# Patient Record
Sex: Male | Born: 1990 | Race: White | Hispanic: No | Marital: Married | Smoking: Never smoker
Health system: Southern US, Community
[De-identification: ages and names within clinical notes are randomized; demographics above are authoritative.]

## PROBLEM LIST (undated history)

## (undated) DIAGNOSIS — F329 Major depressive disorder, single episode, unspecified: Secondary | ICD-10-CM

## (undated) DIAGNOSIS — F319 Bipolar disorder, unspecified: Secondary | ICD-10-CM

## (undated) DIAGNOSIS — F32A Depression, unspecified: Secondary | ICD-10-CM

## (undated) HISTORY — DX: Depression, unspecified: F32.A

## (undated) HISTORY — DX: Major depressive disorder, single episode, unspecified: F32.9

## (undated) HISTORY — DX: Bipolar disorder, unspecified: F31.9

## (undated) MED FILL — Fosaprepitant Dimeglumine For IV Infusion 150 MG (Base Eq): INTRAVENOUS | Qty: 5 | Status: AC

---
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
## 2015-01-08 ENCOUNTER — Ambulatory Visit (INDEPENDENT_AMBULATORY_CARE_PROVIDER_SITE_OTHER): Payer: 59 | Admitting: Family Medicine

## 2015-01-08 ENCOUNTER — Encounter: Payer: Self-pay | Admitting: Family Medicine

## 2015-01-08 VITALS — BP 97/64 | HR 65 | Temp 98.8°F | Ht 68.7 in | Wt 158.0 lb

## 2015-01-08 DIAGNOSIS — L80 Vitiligo: Secondary | ICD-10-CM | POA: Diagnosis not present

## 2015-01-08 DIAGNOSIS — F32A Depression, unspecified: Secondary | ICD-10-CM

## 2015-01-08 DIAGNOSIS — F329 Major depressive disorder, single episode, unspecified: Secondary | ICD-10-CM | POA: Diagnosis not present

## 2015-01-08 MED ORDER — QUETIAPINE FUMARATE 50 MG PO TABS: 50.0000 mg | ORAL_TABLET | Freq: Every day | ORAL | Status: DC

## 2015-01-08 NOTE — Assessment & Plan Note (Signed)
Restart Seroquel 

## 2015-01-08 NOTE — Progress Notes (Signed)
   BP 97/64 mmHg  Pulse 65  Temp(Src) 98.8 F (37.1 C)  Ht 5' 8.7" (1.745 m)  Wt 158 lb (71.668 kg)  BMI 23.54 kg/m2  SpO2 98%   Subjective:    Patient ID: Benjamin Choi, male    DOB: [birth year], 24 y.o.   MRN: [id number]  HPI: Benjamin OverlieDylan M Wish is a 24 y.o. male  Chief Complaint  Patient presents with  . bald spot    after haircut x322months, not growing back  . Depression    would like to get back on meds   Approximately 2 months ago patient noticed a bald spot after getting close haircut. This area has not really grown it's in the posterior right scalp area behind his right ear. No known irritation no itching no redness noted lesions no contacts. Patient also has insurance issues straightened out did better on Seroquel immediate release wants to get back on that medication.  Relevant past medical, surgical, family and social history reviewed and updated as indicated. Interim medical history since our last visit reviewed. Allergies and medications reviewed and updated.  Review of Systems  Constitutional: Negative.   Respiratory: Negative.   Cardiovascular: Negative.     Per HPI unless specifically indicated above     Objective:    BP 97/64 mmHg  Pulse 65  Temp(Src) 98.8 F (37.1 C)  Ht 5' 8.7" (1.745 m)  Wt 158 lb (71.668 kg)  BMI 23.54 kg/m2  SpO2 98%  Wt Readings from Last 3 Encounters:  01/08/15 158 lb (71.668 kg)  08/02/13 151 lb (68.493 kg)    Physical Exam  Constitutional: He is oriented to person, place, and time. He appears well-developed and well-nourished. No distress.  HENT:  Head: Normocephalic and atraumatic.  Right Ear: Hearing normal.  Left Ear: Hearing normal.  Nose: Nose normal.  Eyes: Conjunctivae and lids are normal. Right eye exhibits no discharge. Left eye exhibits no discharge. No scleral icterus.  Cardiovascular: Normal rate, regular rhythm and normal heart sounds.   Pulmonary/Chest: Effort normal. No respiratory distress.   Musculoskeletal: Normal range of motion.  Neurological: He is alert and oriented to person, place, and time.  Skin: Skin is intact. No rash noted.  Area of vitiligo and posterior right ear  Psychiatric: He has a normal mood and affect. His speech is normal and behavior is normal. Judgment and thought content normal. Cognition and memory are normal.    No results found for this or any previous visit.    Assessment & Plan:   Problem List Items Addressed This Visit      Musculoskeletal and Integument   Vitiligo capitis    We'll draw labs and refer to dermatology      Relevant Orders   Ambulatory referral to Dermatology     Other   Depression - Primary    Restart Seroquel      Relevant Medications   QUEtiapine (SEROQUEL) 50 MG tablet       Follow up plan: Return in about 6 months (around 07/08/2015) for Physical Exam.

## 2015-01-08 NOTE — Assessment & Plan Note (Signed)
We'll draw labs and refer to dermatology

## 2015-01-09 ENCOUNTER — Encounter: Payer: Self-pay | Admitting: Family Medicine

## 2015-01-09 LAB — COMPREHENSIVE METABOLIC PANEL
ALBUMIN: 4.4 g/dL (ref 3.5–5.5)
ALK PHOS: 74 IU/L (ref 39–117)
ALT: 19 IU/L (ref 0–44)
AST: 29 IU/L (ref 0–40)
Albumin/Globulin Ratio: 2.2 (ref 1.1–2.5)
BILIRUBIN TOTAL: 1 mg/dL (ref 0.0–1.2)
BUN / CREAT RATIO: 10 (ref 8–19)
BUN: 11 mg/dL (ref 6–20)
CHLORIDE: 100 mmol/L (ref 97–106)
CO2: 25 mmol/L (ref 18–29)
Calcium: 9.4 mg/dL (ref 8.7–10.2)
Creatinine, Ser: 1.15 mg/dL (ref 0.76–1.27)
GFR calc non Af Amer: 90 mL/min/{1.73_m2} (ref >59)
GFR, EST AFRICAN AMERICAN: 104 mL/min/{1.73_m2} (ref >59)
GLOBULIN, TOTAL: 2 g/dL (ref 1.5–4.5)
Glucose: 65 mg/dL (ref 65–99)
Potassium: 4.2 mmol/L (ref 3.5–5.2)
SODIUM: 141 mmol/L (ref 136–144)
Total Protein: 6.4 g/dL (ref 6.0–8.5)

## 2015-01-09 LAB — VITAMIN B12: Vitamin B-12: 495 pg/mL (ref 211–946)

## 2015-01-09 LAB — TSH: TSH: 0.772 u[IU]/mL (ref 0.450–4.500)

## 2015-02-05 ENCOUNTER — Other Ambulatory Visit: Payer: Self-pay | Admitting: Unknown Physician Specialty

## 2015-02-05 NOTE — Telephone Encounter (Signed)
Your patient.  Thanks 

## 2015-07-09 ENCOUNTER — Encounter: Payer: Self-pay | Admitting: Family Medicine

## 2015-07-09 ENCOUNTER — Ambulatory Visit (INDEPENDENT_AMBULATORY_CARE_PROVIDER_SITE_OTHER): Payer: BLUE CROSS/BLUE SHIELD | Admitting: Family Medicine

## 2015-07-09 VITALS — BP 96/56 | HR 62 | Temp 98.2°F | Ht 68.8 in | Wt 144.0 lb

## 2015-07-09 DIAGNOSIS — F32A Depression, unspecified: Secondary | ICD-10-CM

## 2015-07-09 DIAGNOSIS — Z0000 Encounter for general adult medical examination without abnormal findings: Secondary | ICD-10-CM | POA: Diagnosis not present

## 2015-07-09 DIAGNOSIS — F329 Major depressive disorder, single episode, unspecified: Secondary | ICD-10-CM

## 2015-07-09 LAB — URINALYSIS, ROUTINE W REFLEX MICROSCOPIC
Bilirubin, UA: NEGATIVE
Glucose, UA: NEGATIVE
Ketones, UA: NEGATIVE
LEUKOCYTES UA: NEGATIVE
NITRITE UA: NEGATIVE
PH UA: 6 (ref 5.0–7.5)
Protein, UA: NEGATIVE
RBC UA: NEGATIVE
Urobilinogen, Ur: 0.2 mg/dL (ref 0.2–1.0)

## 2015-07-09 NOTE — Addendum Note (Signed)
Addended by: Bennetta LaosWILSON, NANCY H on: 07/09/2015 10:50 AM   Modules accepted: SmartSet

## 2015-07-09 NOTE — Assessment & Plan Note (Signed)
The current medical regimen is effective;  continue present plan and medications.  

## 2015-07-09 NOTE — Progress Notes (Signed)
BP 96/56 mmHg  Pulse 62  Temp(Src) 98.2 F (36.8 C)  Ht 5' 8.8" (1.748 m)  Wt 144 lb (65.318 kg)  BMI 21.38 kg/m2  SpO2 96%   Subjective:    Patient ID: Benjamin Choi, male    DOB: 03-Jun-1990, 25 y.o.   MRN: [id number]  HPI: Benjamin Choi is a 25 y.o. male  Chief Complaint  Patient presents with  . Annual Exam   Patient's nerves doing well taking quetiapine at bedtime sleeps well wakes up okay no side effects and nerves are doing good. Patient's weights down sometimes it's been out of the gym Work is going well with no issues. Relevant past medical, surgical, family and social history reviewed and updated as indicated. Interim medical history since our last visit reviewed. Allergies and medications reviewed and updated.  Review of Systems  Constitutional: Negative.   HENT: Negative.   Eyes: Negative.   Respiratory: Negative.   Cardiovascular: Negative.   Gastrointestinal: Negative.   Endocrine: Negative.   Genitourinary: Negative.   Musculoskeletal: Negative.   Skin: Negative.   Allergic/Immunologic: Negative.   Neurological: Negative.   Hematological: Negative.   Psychiatric/Behavioral: Negative.     Per HPI unless specifically indicated above     Objective:    BP 96/56 mmHg  Pulse 62  Temp(Src) 98.2 F (36.8 C)  Ht 5' 8.8" (1.748 m)  Wt 144 lb (65.318 kg)  BMI 21.38 kg/m2  SpO2 96%  Wt Readings from Last 3 Encounters:  07/09/15 144 lb (65.318 kg)  01/08/15 158 lb (71.668 kg)  08/02/13 151 lb (68.493 kg)    Physical Exam  Constitutional: He is oriented to person, place, and time. He appears well-developed and well-nourished.  HENT:  Head: Normocephalic.  Right Ear: External ear normal.  Left Ear: External ear normal.  Nose: Nose normal.  Eyes: Conjunctivae and EOM are normal. Pupils are equal, round, and reactive to light.  Neck: Normal range of motion. Neck supple. No thyromegaly present.  Cardiovascular: Normal rate, regular rhythm, normal  heart sounds and intact distal pulses.   Pulmonary/Chest: Effort normal and breath sounds normal.  Abdominal: Soft. Bowel sounds are normal. There is no splenomegaly or hepatomegaly.  Genitourinary: Penis normal.  Musculoskeletal: Normal range of motion.  Lymphadenopathy:    He has no cervical adenopathy.  Neurological: He is alert and oriented to person, place, and time. He has normal reflexes.  Skin: Skin is warm and dry.  Psychiatric: He has a normal mood and affect. His behavior is normal. Judgment and thought content normal.    Results for orders placed or performed in visit on 01/08/15  Comprehensive metabolic panel  Result Value Ref Range   Glucose 65 65 - 99 mg/dL   BUN 11 6 - 20 mg/dL   Creatinine, Ser 4.091.15 0.76 - 1.27 mg/dL   GFR calc non Af Amer 90 >59 mL/min/1.73   GFR calc Af Amer 104 >59 mL/min/1.73   BUN/Creatinine Ratio 10 8 - 19   Sodium 141 136 - 144 mmol/L   Potassium 4.2 3.5 - 5.2 mmol/L   Chloride 100 97 - 106 mmol/L   CO2 25 18 - 29 mmol/L   Calcium 9.4 8.7 - 10.2 mg/dL   Total Protein 6.4 6.0 - 8.5 g/dL   Albumin 4.4 3.5 - 5.5 g/dL   Globulin, Total 2.0 1.5 - 4.5 g/dL   Albumin/Globulin Ratio 2.2 1.1 - 2.5   Bilirubin Total 1.0 0.0 - 1.2 mg/dL   Alkaline  Phosphatase 74 39 - 117 IU/L   AST 29 0 - 40 IU/L   ALT 19 0 - 44 IU/L  TSH  Result Value Ref Range   TSH 0.772 0.450 - 4.500 uIU/mL  Vitamin B12  Result Value Ref Range   Vitamin B-12 495 211 - 946 pg/mL      Assessment & Plan:   Problem List Items Addressed This Visit      Other   Depression    The current medical regimen is effective;  continue present plan and medications.        Other Visit Diagnoses    Routine general medical examination at a health care facility    -  Primary    Relevant Orders    CBC with Differential/Platelet    Comprehensive metabolic panel    Lipid Panel w/o Chol/HDL Ratio    TSH    Urinalysis, Routine w reflex microscopic (not at [facility])    Healthcare  maintenance            Follow up plan: Return in about 6 months (around 01/09/2016) for med check.

## 2015-07-10 ENCOUNTER — Encounter: Payer: Self-pay | Admitting: Family Medicine

## 2015-07-10 LAB — CBC WITH DIFFERENTIAL/PLATELET
Basophils Absolute: 0 10*3/uL (ref 0.0–0.2)
Basos: 1 %
EOS (ABSOLUTE): 0.1 10*3/uL (ref 0.0–0.4)
Eos: 3 %
HEMATOCRIT: 45.2 % (ref 37.5–51.0)
HEMOGLOBIN: 15.6 g/dL (ref 12.6–17.7)
Immature Grans (Abs): 0 10*3/uL (ref 0.0–0.1)
Immature Granulocytes: 0 %
LYMPHS ABS: 1.7 10*3/uL (ref 0.7–3.1)
Lymphs: 33 %
MCH: 31.9 pg (ref 26.6–33.0)
MCHC: 34.5 g/dL (ref 31.5–35.7)
MCV: 92 fL (ref 79–97)
MONOCYTES: 9 %
MONOS ABS: 0.4 10*3/uL (ref 0.1–0.9)
NEUTROS ABS: 2.8 10*3/uL (ref 1.4–7.0)
Neutrophils: 54 %
Platelets: 248 10*3/uL (ref 150–379)
RBC: 4.89 x10E6/uL (ref 4.14–5.80)
RDW: 12.7 % (ref 12.3–15.4)
WBC: 5.1 10*3/uL (ref 3.4–10.8)

## 2015-07-10 LAB — COMPREHENSIVE METABOLIC PANEL
A/G RATIO: 1.7 (ref 1.2–2.2)
ALBUMIN: 4.5 g/dL (ref 3.5–5.5)
ALK PHOS: 69 IU/L (ref 39–117)
ALT: 15 IU/L (ref 0–44)
AST: 21 IU/L (ref 0–40)
BILIRUBIN TOTAL: 0.7 mg/dL (ref 0.0–1.2)
BUN / CREAT RATIO: 10 (ref 9–20)
BUN: 11 mg/dL (ref 6–20)
CHLORIDE: 100 mmol/L (ref 96–106)
CO2: 24 mmol/L (ref 18–29)
Calcium: 9.7 mg/dL (ref 8.7–10.2)
Creatinine, Ser: 1.05 mg/dL (ref 0.76–1.27)
GFR calc Af Amer: 116 mL/min/{1.73_m2} (ref >59)
GFR calc non Af Amer: 100 mL/min/{1.73_m2} (ref >59)
GLOBULIN, TOTAL: 2.7 g/dL (ref 1.5–4.5)
Glucose: 67 mg/dL (ref 65–99)
POTASSIUM: 4.6 mmol/L (ref 3.5–5.2)
SODIUM: 141 mmol/L (ref 134–144)
Total Protein: 7.2 g/dL (ref 6.0–8.5)

## 2015-07-10 LAB — TSH: TSH: 1.34 u[IU]/mL (ref 0.450–4.500)

## 2015-07-10 LAB — LIPID PANEL W/O CHOL/HDL RATIO
CHOLESTEROL TOTAL: 161 mg/dL (ref 100–199)
HDL: 55 mg/dL (ref >39)
LDL CALC: 73 mg/dL (ref 0–99)
TRIGLYCERIDES: 165 mg/dL — AB (ref 0–149)
VLDL Cholesterol Cal: 33 mg/dL (ref 5–40)

## 2016-01-14 ENCOUNTER — Ambulatory Visit (INDEPENDENT_AMBULATORY_CARE_PROVIDER_SITE_OTHER): Payer: BLUE CROSS/BLUE SHIELD | Admitting: Family Medicine

## 2016-01-14 ENCOUNTER — Encounter: Payer: Self-pay | Admitting: Family Medicine

## 2016-01-14 VITALS — BP 113/67 | HR 66 | Temp 98.3°F | Wt 146.3 lb

## 2016-01-14 DIAGNOSIS — R1084 Generalized abdominal pain: Secondary | ICD-10-CM

## 2016-01-14 DIAGNOSIS — F3342 Major depressive disorder, recurrent, in full remission: Secondary | ICD-10-CM

## 2016-01-14 MED ORDER — QUETIAPINE FUMARATE 50 MG PO TABS: 50.0000 mg | ORAL_TABLET | Freq: Every day | ORAL | 7 refills | Status: DC

## 2016-01-14 NOTE — Assessment & Plan Note (Signed)
The current medical regimen is effective;  continue present plan and medications.  

## 2016-01-14 NOTE — Progress Notes (Signed)
BP 113/67 (BP Location: Left Arm, Patient Position: Sitting, Cuff Size: Normal)   Pulse 66   Temp 98.3 F (36.8 C)   Wt 146 lb 4.8 oz (66.4 kg)   SpO2 97%   BMI 21.73 kg/m    Subjective:    Patient ID: Benjamin Choi, male    DOB: Jun 02, 1990, 25 y.o.   MRN: [id number]  HPI: Benjamin Choi is a 25 y.o. male  Chief Complaint  Patient presents with  . Depression  Patient all in all doing well taking Seroquel without problems sleeps well no side effects. Doing well at work and at home. Patient has noticed some nonspecific intermittent abdominal pain mostly lower left to lower right, moves back and forth from time to time not associated with bowel movements or with diet been ongoing a few weeks. Occasionally is intense enough to really get his attention and lasts a few minutes or so.  Relevant past medical, surgical, family and social history reviewed and updated as indicated. Interim medical history since our last visit reviewed. Allergies and medications reviewed and updated.  Review of Systems  Constitutional: Negative.   Respiratory: Negative.   Cardiovascular: Negative.     Per HPI unless specifically indicated above     Objective:    BP 113/67 (BP Location: Left Arm, Patient Position: Sitting, Cuff Size: Normal)   Pulse 66   Temp 98.3 F (36.8 C)   Wt 146 lb 4.8 oz (66.4 kg)   SpO2 97%   BMI 21.73 kg/m   Wt Readings from Last 3 Encounters:  01/14/16 146 lb 4.8 oz (66.4 kg)  07/09/15 144 lb (65.3 kg)  01/08/15 158 lb (71.7 kg)    Physical Exam  Constitutional: He is oriented to person, place, and time. He appears well-developed and well-nourished. No distress.  HENT:  Head: Normocephalic and atraumatic.  Right Ear: Hearing normal.  Left Ear: Hearing normal.  Nose: Nose normal.  Eyes: Conjunctivae and lids are normal. Right eye exhibits no discharge. Left eye exhibits no discharge. No scleral icterus.  Cardiovascular: Normal rate, regular rhythm and normal  heart sounds.   Pulmonary/Chest: Effort normal and breath sounds normal. No respiratory distress.  Abdominal: Soft. Bowel sounds are normal. He exhibits no distension and no mass. There is no tenderness. There is no rebound and no guarding.  Musculoskeletal: Normal range of motion.  Neurological: He is alert and oriented to person, place, and time.  Skin: Skin is intact. No rash noted.  Psychiatric: He has a normal mood and affect. His speech is normal and behavior is normal. Judgment and thought content normal. Cognition and memory are normal.    Results for orders placed or performed in visit on 07/09/15  CBC with Differential/Platelet  Result Value Ref Range   WBC 5.1 3.4 - 10.8 x10E3/uL   RBC 4.89 4.14 - 5.80 x10E6/uL   Hemoglobin 15.6 12.6 - 17.7 g/dL   Hematocrit 40.945.2 81.137.5 - 51.0 %   MCV 92 79 - 97 fL   MCH 31.9 26.6 - 33.0 pg   MCHC 34.5 31.5 - 35.7 g/dL   RDW 91.412.7 78.212.3 - 95.615.4 %   Platelets 248 150 - 379 x10E3/uL   Neutrophils 54 %   Lymphs 33 %   Monocytes 9 %   Eos 3 %   Basos 1 %   Neutrophils Absolute 2.8 1.4 - 7.0 x10E3/uL   Lymphocytes Absolute 1.7 0.7 - 3.1 x10E3/uL   Monocytes Absolute 0.4 0.1 - 0.9 x10E3/uL  EOS (ABSOLUTE) 0.1 0.0 - 0.4 x10E3/uL   Basophils Absolute 0.0 0.0 - 0.2 x10E3/uL   Immature Granulocytes 0 %   Immature Grans (Abs) 0.0 0.0 - 0.1 x10E3/uL  Comprehensive metabolic panel  Result Value Ref Range   Glucose 67 65 - 99 mg/dL   BUN 11 6 - 20 mg/dL   Creatinine, Ser 1.611.05 0.76 - 1.27 mg/dL   GFR calc non Af Amer 100 >59 mL/min/1.73   GFR calc Af Amer 116 >59 mL/min/1.73   BUN/Creatinine Ratio 10 9 - 20   Sodium 141 134 - 144 mmol/L   Potassium 4.6 3.5 - 5.2 mmol/L   Chloride 100 96 - 106 mmol/L   CO2 24 18 - 29 mmol/L   Calcium 9.7 8.7 - 10.2 mg/dL   Total Protein 7.2 6.0 - 8.5 g/dL   Albumin 4.5 3.5 - 5.5 g/dL   Globulin, Total 2.7 1.5 - 4.5 g/dL   Albumin/Globulin Ratio 1.7 1.2 - 2.2   Bilirubin Total 0.7 0.0 - 1.2 mg/dL   Alkaline  Phosphatase 69 39 - 117 IU/L   AST 21 0 - 40 IU/L   ALT 15 0 - 44 IU/L  Lipid Panel w/o Chol/HDL Ratio  Result Value Ref Range   Cholesterol, Total 161 100 - 199 mg/dL   Triglycerides 096165 (H) 0 - 149 mg/dL   HDL 55 >04>39 mg/dL   VLDL Cholesterol Cal 33 5 - 40 mg/dL   LDL Calculated 73 0 - 99 mg/dL  TSH  Result Value Ref Range   TSH 1.340 0.450 - 4.500 uIU/mL  Urinalysis, Routine w reflex microscopic (not at [facility])  Result Value Ref Range   Specific Gravity, UA <1.005 (L) 1.005 - 1.030   pH, UA 6.0 5.0 - 7.5   Color, UA Yellow Yellow   Appearance Ur Clear Clear   Leukocytes, UA Negative Negative   Protein, UA Negative Negative/Trace   Glucose, UA Negative Negative   Ketones, UA Negative Negative   RBC, UA Negative Negative   Bilirubin, UA Negative Negative   Urobilinogen, Ur 0.2 0.2 - 1.0 mg/dL   Nitrite, UA Negative Negative      Assessment & Plan:   Problem List Items Addressed This Visit      Other   Depression    The current medical regimen is effective;  continue present plan and medications.       Relevant Medications   QUEtiapine (SEROQUEL) 50 MG tablet   Other Relevant Orders   Basic metabolic panel    Other Visit Diagnoses    Generalized abdominal pain    -  Primary   Relevant Orders   Basic metabolic panel      For abdominal pain will observe watch diet will check BMP Follow up plan: Return in about 6 months (around 07/13/2016) for Physical Exam.

## 2016-01-15 ENCOUNTER — Encounter: Payer: Self-pay | Admitting: Family Medicine

## 2016-01-15 LAB — BASIC METABOLIC PANEL
BUN / CREAT RATIO: 15 (ref 9–20)
BUN: 13 mg/dL (ref 6–20)
CHLORIDE: 98 mmol/L (ref 96–106)
CO2: 25 mmol/L (ref 18–29)
Calcium: 9.4 mg/dL (ref 8.7–10.2)
Creatinine, Ser: 0.88 mg/dL (ref 0.76–1.27)
GFR calc non Af Amer: 121 mL/min/{1.73_m2} (ref >59)
GFR, EST AFRICAN AMERICAN: 140 mL/min/{1.73_m2} (ref >59)
Glucose: 78 mg/dL (ref 65–99)
POTASSIUM: 4.2 mmol/L (ref 3.5–5.2)
Sodium: 140 mmol/L (ref 134–144)

## 2016-01-21 ENCOUNTER — Other Ambulatory Visit: Payer: Self-pay | Admitting: Family Medicine

## 2016-01-21 DIAGNOSIS — F32A Depression, unspecified: Secondary | ICD-10-CM

## 2016-01-21 DIAGNOSIS — F329 Major depressive disorder, single episode, unspecified: Secondary | ICD-10-CM

## 2016-02-25 ENCOUNTER — Telehealth: Payer: Self-pay | Admitting: Family Medicine

## 2016-02-25 MED ORDER — CARIPRAZINE HCL 3 MG PO CAPS: 3.0000 mg | ORAL_CAPSULE | Freq: Every day | ORAL | 1 refills | Status: DC

## 2016-02-25 MED ORDER — BREXPIPRAZOLE 2 MG PO TABS: 1.0000 mg | ORAL_TABLET | Freq: Every day | ORAL | 1 refills | Status: DC

## 2016-02-25 MED ORDER — CARIPRAZINE HCL 1.5 MG PO CAPS: 1.5000 mg | ORAL_CAPSULE | Freq: Every day | ORAL | 0 refills | Status: DC

## 2016-02-25 NOTE — Telephone Encounter (Signed)
Dr.Crissman, is there a generic for this medication?

## 2016-02-25 NOTE — Telephone Encounter (Addendum)
Patient needs samples is unable to get Seroquel due to insurance change changed to [employer]

## 2016-02-25 NOTE — Telephone Encounter (Signed)
Call pt tell rx and 30 day coupon up front

## 2016-02-25 NOTE — Telephone Encounter (Signed)
Routing to provider  

## 2016-02-25 NOTE — Telephone Encounter (Signed)
Call pt 

## 2016-02-25 NOTE — Telephone Encounter (Signed)
Pt called stated he needs Dr. Dossie Arbourrissman or his CMA to call and state that it is ok for the pharmacy to give the patient this medication. Pt will not have insurance until January 2018. Pt would like to know if there is a generic that can be called in for Seroquel. [facility] [place] Thanks.

## 2016-02-25 NOTE — Addendum Note (Signed)
Addended byVonita Moss: Dajai Wahlert on: 02/25/2016 05:14 PM   Modules accepted: Orders

## 2016-02-25 NOTE — Telephone Encounter (Signed)
Patient walked in stating [facility] in [place] needs for Dr Dossie Arbourrissman to give them permission for them to give him samples of his generic Seroquel as he no longer has ins coverage at this time.  I told him I would send this message back to see if you could take care of this.  Thank Bonita QuinYou  Clydie BraunKaren  [phone]

## 2016-02-25 NOTE — Addendum Note (Signed)
Addended byVonita Moss: Cassius Cullinane on: 02/25/2016 05:22 PM   Modules accepted: Orders

## 2016-02-26 NOTE — Telephone Encounter (Signed)
Called and let patient know that rx and coupon card were ready to be picked up.

## 2016-07-09 ENCOUNTER — Encounter: Payer: 59 | Admitting: Family Medicine

## 2016-10-15 ENCOUNTER — Other Ambulatory Visit: Payer: Self-pay | Admitting: Family Medicine

## 2016-10-15 DIAGNOSIS — F3342 Major depressive disorder, recurrent, in full remission: Secondary | ICD-10-CM

## 2016-10-17 ENCOUNTER — Ambulatory Visit: Payer: 59 | Admitting: Family Medicine

## 2016-11-07 ENCOUNTER — Other Ambulatory Visit: Payer: Self-pay | Admitting: Family Medicine

## 2016-11-07 DIAGNOSIS — F3342 Major depressive disorder, recurrent, in full remission: Secondary | ICD-10-CM

## 2016-11-07 NOTE — Telephone Encounter (Signed)
Can you please get patient scheduled?

## 2016-11-07 NOTE — Telephone Encounter (Signed)
Needs follow up scheduled to get refill

## 2016-11-18 ENCOUNTER — Telehealth: Payer: Self-pay | Admitting: Family Medicine

## 2016-11-18 NOTE — Telephone Encounter (Signed)
Pt scheduled w/ provider for tomorrow.

## 2016-11-19 ENCOUNTER — Ambulatory Visit (INDEPENDENT_AMBULATORY_CARE_PROVIDER_SITE_OTHER): Payer: 59 | Admitting: Family Medicine

## 2016-11-19 ENCOUNTER — Encounter: Payer: Self-pay | Admitting: Family Medicine

## 2016-11-19 VITALS — BP 136/81 | HR 51 | Wt 155.0 lb

## 2016-11-19 DIAGNOSIS — F3342 Major depressive disorder, recurrent, in full remission: Secondary | ICD-10-CM

## 2016-11-19 DIAGNOSIS — F319 Bipolar disorder, unspecified: Secondary | ICD-10-CM

## 2016-11-19 MED ORDER — QUETIAPINE FUMARATE 50 MG PO TABS: 50.0000 mg | ORAL_TABLET | Freq: Every day | ORAL | 1 refills | Status: DC

## 2016-11-19 NOTE — Assessment & Plan Note (Signed)
Depression doing well with bipolar tendencies will ahead and discontinue Seroquel by decreasing to a half a tablet a day for a week or so may need to go to intermittent dosing. Discussed anxiety sleep and options for helping. Discussed possibility of recurrence and need to be vigilant and may need to restart medications. Follow-up if not getting better or worse.

## 2016-11-19 NOTE — Progress Notes (Signed)
   BP 136/81   Pulse (!) 51   Wt 155 lb (70.3 kg)   SpO2 98%   BMI 23.02 kg/m    Subjective:    Patient ID: Benjamin Choi, male    DOB: 1991/01/02, 26 y.o.   MRN: [id number]  HPI: Benjamin Choi is a 26 y.o. male  Chief Complaint  Patient presents with  . Follow-up   Patient follow-up has been on Seroquel for a year now. Smoking to stop. Patient's nerves and mood doing well with no problems. Depression doing well with no further issues. Anxiety doing well with no further issues. Sleep and also doing well as that was a big problem. Relevant past medical, surgical, family and social history reviewed and updated as indicated. Interim medical history since our last visit reviewed. Allergies and medications reviewed and updated.  Review of Systems  Constitutional: Negative.   Respiratory: Negative.   Cardiovascular: Negative.     Per HPI unless specifically indicated above     Objective:    BP 136/81   Pulse (!) 51   Wt 155 lb (70.3 kg)   SpO2 98%   BMI 23.02 kg/m   Wt Readings from Last 3 Encounters:  11/19/16 155 lb (70.3 kg)  01/14/16 146 lb 4.8 oz (66.4 kg)  07/09/15 144 lb (65.3 kg)    Physical Exam  Constitutional: He is oriented to person, place, and time. He appears well-developed and well-nourished.  HENT:  Head: Normocephalic and atraumatic.  Eyes: Conjunctivae and EOM are normal.  Neck: Normal range of motion.  Cardiovascular: Normal rate, regular rhythm and normal heart sounds.   Pulmonary/Chest: Effort normal and breath sounds normal.  Musculoskeletal: Normal range of motion.  Neurological: He is alert and oriented to person, place, and time.  Skin: No erythema.  Psychiatric: He has a normal mood and affect. His behavior is normal. Judgment and thought content normal.    Results for orders placed or performed in visit on 01/14/16  Basic metabolic panel  Result Value Ref Range   Glucose 78 65 - 99 mg/dL   BUN 13 6 - 20 mg/dL   Creatinine, Ser  7.820.88 0.76 - 1.27 mg/dL   GFR calc non Af Amer 121 >59 mL/min/1.73   GFR calc Af Amer 140 >59 mL/min/1.73   BUN/Creatinine Ratio 15 9 - 20   Sodium 140 134 - 144 mmol/L   Potassium 4.2 3.5 - 5.2 mmol/L   Chloride 98 96 - 106 mmol/L   CO2 25 18 - 29 mmol/L   Calcium 9.4 8.7 - 10.2 mg/dL      Assessment & Plan:   Problem List Items Addressed This Visit      Other   Depression - Primary    Depression doing well with bipolar tendencies will ahead and discontinue Seroquel by decreasing to a half a tablet a day for a week or so may need to go to intermittent dosing. Discussed anxiety sleep and options for helping. Discussed possibility of recurrence and need to be vigilant and may need to restart medications. Follow-up if not getting better or worse.      Relevant Medications   QUEtiapine (SEROQUEL) 50 MG tablet    Other Visit Diagnoses    Bipolar 1 disorder (HCC)           Follow up plan: Return in about 6 months (around 05/19/2017) for Physical Exam.

## 2017-01-15 ENCOUNTER — Telehealth: Payer: Self-pay | Admitting: Family Medicine

## 2017-01-15 DIAGNOSIS — F3342 Major depressive disorder, recurrent, in full remission: Secondary | ICD-10-CM

## 2017-01-15 NOTE — Telephone Encounter (Signed)
Copied from CRM #[id number]. Topic: Quick Communication - See Telephone Encounter >> Jan 15, 2017 11:58 AM Landry MellowFoltz, Melissa J wrote: CRM for notification. See Telephone encounter for:   01/15/17. Pt went to pharmacy to pick up QUEtiapine (SEROQUEL) 50 MG tablet and was told he had to get authorization from provider. [facility] [place]  Pt cb number is [phone]

## 2017-01-15 NOTE — Telephone Encounter (Signed)
See attached

## 2017-01-16 MED ORDER — QUETIAPINE FUMARATE 50 MG PO TABS: 50.0000 mg | ORAL_TABLET | Freq: Every day | ORAL | 1 refills | Status: DC

## 2017-01-16 NOTE — Telephone Encounter (Signed)
Pt need refill. Was given 1 month supply w/ 1 refill in September.

## 2017-01-16 NOTE — Telephone Encounter (Signed)
Rx sent to his pharmacy

## 2017-06-08 ENCOUNTER — Encounter: Payer: Self-pay | Admitting: Family Medicine

## 2017-09-02 ENCOUNTER — Ambulatory Visit: Payer: 59 | Admitting: Family Medicine

## 2017-11-18 ENCOUNTER — Encounter: Payer: 59 | Admitting: Family Medicine

## 2017-12-09 ENCOUNTER — Encounter: Payer: 59 | Admitting: Family Medicine

## 2020-10-07 ENCOUNTER — Other Ambulatory Visit: Payer: Self-pay

## 2020-10-07 ENCOUNTER — Ambulatory Visit
Admission: EM | Admit: 2020-10-07 | Discharge: 2020-10-07 | Disposition: A | Discharge status: Home / Self Care | Payer: BC Managed Care – PPO | Attending: Family Medicine | Admitting: Family Medicine

## 2020-10-07 DIAGNOSIS — J209 Acute bronchitis, unspecified: Secondary | ICD-10-CM

## 2020-10-07 DIAGNOSIS — Z20822 Contact with and (suspected) exposure to covid-19: Secondary | ICD-10-CM | POA: Diagnosis not present

## 2020-10-07 DIAGNOSIS — Z1152 Encounter for screening for COVID-19: Secondary | ICD-10-CM | POA: Diagnosis not present

## 2020-10-07 MED ORDER — PREDNISONE 20 MG PO TABS: 20.0000 mg | ORAL_TABLET | Freq: Every day | ORAL | 0 refills | Status: AC

## 2020-10-07 MED ORDER — ALBUTEROL SULFATE HFA 108 (90 BASE) MCG/ACT IN AERS: 2.0000 | INHALATION_SPRAY | RESPIRATORY_TRACT | 0 refills | Status: DC | PRN

## 2020-10-07 MED ORDER — PSEUDOEPH-BROMPHEN-DM 30-2-10 MG/5ML PO SYRP: 5.0000 mL | ORAL_SOLUTION | Freq: Three times a day (TID) | ORAL | 0 refills | Status: DC | PRN

## 2020-10-07 NOTE — ED Triage Notes (Signed)
Pt presents with c/o cough and feeling unwell, also has c/o chest heaviness that began this morning

## 2020-10-07 NOTE — Discharge Instructions (Addendum)
Your COVID 19 results should result within 2-4 days.  If your COVID test is positive you will need to quarantine for total of 5 days.  I am writing you out of work until Wednesday to allow time for your COVID test to result.  If negative you may return to work on 10/10/2020.  If positive he will need to quarantine unti 10/12/2020. Negative results are immediately resulted to Mychart. Positive results will receive a follow-up call from our clinic. If symptoms are present, I recommend home quarantine until results are known.  Alternate Tylenol and ibuprofen as needed for body aches and fever.  Symptom management per recommendations discussed today.  If any breathing difficulty or chest pain develops go immediately to the closest emergency department for evaluation.

## 2020-10-07 NOTE — ED Provider Notes (Signed)
Renaldo Fiddler    CSN: [id number] Arrival date & time: 10/07/20  1332      History   Chief Complaint Chief Complaint  Patient presents with   Cough   Chest Pain    HPI JULIE PAOLINI is a 30 y.o. male.   HPI Patient presents with URI symptoms including cough, sore throat, chest burning and heaviness, and nasal congestion. Low grade fever today.Denies worrisome symptoms of shortness of breath, weakness, or nausea &  vomiting. 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 No pertinent surgical history.     Home Medications    Prior to Admission medications   Medication Sig Start Date End Date Taking? Authorizing Provider  albuterol (VENTOLIN HFA) 108 (90 Base) MCG/ACT inhaler Inhale 2 puffs into the lungs every 4 (four) hours as needed for wheezing or shortness of breath. 10/07/20  Yes Bing Neighbors, FNP  brompheniramine-pseudoephedrine-DM 30-2-10 MG/5ML syrup Take 5 mLs by mouth 3 (three) times daily as needed. 10/07/20  Yes Bing Neighbors, FNP  predniSONE (DELTASONE) 20 MG tablet Take 1 tablet (20 mg total) by mouth daily with breakfast for 5 days. 10/07/20 10/12/20 Yes Bing Neighbors, FNP  QUEtiapine (SEROQUEL) 50 MG tablet Take 1 tablet (50 mg total) at bedtime by mouth. 01/16/17   Dorcas Carrow, DO    Family History Family History  Problem Relation Age of Onset   Depression Mother     Social History Social History   Tobacco Use   Smoking status: Never   Smokeless tobacco: Never  Substance Use Topics   Alcohol use: Yes    Comment: socially   Drug use: No     Allergies   Patient has no known allergies.   Review of Systems Review of Systems Pertinent negatives listed in HPI   Physical Exam Triage Vital Signs ED Triage Vitals  Enc Vitals Group     BP 10/07/20 1344 123/77      Pulse Rate 10/07/20 1344 83     Resp 10/07/20 1344 20     Temp 10/07/20 1344 99.6 F (37.6 C)     Temp src --      SpO2 10/07/20 1344 97 %     Weight --      Height --      Head Circumference --      Peak Flow --      Pain Score 10/07/20 1343 4     Pain Loc --      Pain Edu? --      Excl. in [place]? --    No data found.  Updated Vital Signs BP 123/77   Pulse 83   Temp 99.6 F (37.6 C)   Resp 20   SpO2 97%   Visual Acuity Right Eye Distance:   Left Eye Distance:   Bilateral Distance:    Right Eye Near:   Left Eye Near:    Bilateral Near:     Physical Exam   General Appearance:    Alert, cooperative, no distress  HENT:   Normocephalic, ears normal, nares mucosal edema with congestion, rhinorrhea, oropharynx    Eyes:    PERRL, conjunctiva/corneas clear, EOM's intact       Lungs:     Coarse lung sounds,  respirations even unlabored,  no wheezing  Heart:    Regular rate and rhythm  Neurologic:   Awake, alert, oriented x 3. No apparent focal neurological deficit    UC Treatments / Results  Labs (all labs ordered are listed, but only abnormal results are displayed) Labs Reviewed - No data to display  EKG   Radiology No results found.  Procedures Procedures (including critical care time)  Medications Ordered in UC Medications - No data to display  Initial Impression / Assessment and Plan / UC Course  I have reviewed the triage vital signs and the nursing notes.  Pertinent labs & imaging results that were available during my care of the patient were reviewed by me and considered in my medical decision making (see chart for details).    Acute bronchitis, treatment per discharge instructions. COVID/Flu test pending. Manage fever with Tylenol and ibuprofen.  Nasal symptoms with over-the-counter antihistamines recommended.  Treatment per discharge medications/discharge instructions.  Red flags/ER precautions given. The most current CDC isolation/quarantine  recommendation advised.   Final Clinical Impressions(s) / UC Diagnoses   Final diagnoses:  Acute bronchitis, unspecified organism  Encounter for screening for COVID-19     Discharge Instructions      Your COVID 19 results should result within 2-4 days.  If your COVID test is positive you will need to quarantine for total of 5 days.  I am writing you out of work until Wednesday to allow time for your COVID test to result.  If negative you may return to work on 10/10/2020.  If positive he will need to quarantine unti 10/12/2020. Negative results are immediately resulted to Mychart. Positive results will receive a follow-up call from our clinic. If symptoms are present, I recommend home quarantine until results are known.  Alternate Tylenol and ibuprofen as needed for body aches and fever.  Symptom management per recommendations discussed today.  If any breathing difficulty or chest pain develops go immediately to the closest emergency department for evaluation.      ED Prescriptions     Medication Sig Dispense Auth. Provider   predniSONE (DELTASONE) 20 MG tablet Take 1 tablet (20 mg total) by mouth daily with breakfast for 5 days. 5 tablet Bing Neighbors, FNP   albuterol (VENTOLIN HFA) 108 (90 Base) MCG/ACT inhaler Inhale 2 puffs into the lungs every 4 (four) hours as needed for wheezing or shortness of breath. 8 g Bing Neighbors, FNP   brompheniramine-pseudoephedrine-DM 30-2-10 MG/5ML syrup Take 5 mLs by mouth 3 (three) times daily as needed. 140 mL Bing Neighbors, FNP      PDMP not reviewed this encounter.   Bing Neighbors, FNP 10/07/20 1414

## 2020-10-08 DIAGNOSIS — Z03818 Encounter for observation for suspected exposure to other biological agents ruled out: Secondary | ICD-10-CM | POA: Diagnosis not present

## 2020-10-08 DIAGNOSIS — Z20822 Contact with and (suspected) exposure to covid-19: Secondary | ICD-10-CM | POA: Diagnosis not present

## 2020-10-10 LAB — COVID-19, FLU A+B NAA
Influenza A, NAA: NOT DETECTED
Influenza B, NAA: NOT DETECTED
SARS-CoV-2, NAA: NOT DETECTED

## 2020-12-21 DIAGNOSIS — K4090 Unilateral inguinal hernia, without obstruction or gangrene, not specified as recurrent: Secondary | ICD-10-CM | POA: Diagnosis not present

## 2020-12-21 DIAGNOSIS — Z0001 Encounter for general adult medical examination with abnormal findings: Secondary | ICD-10-CM | POA: Diagnosis not present

## 2020-12-27 DIAGNOSIS — K4090 Unilateral inguinal hernia, without obstruction or gangrene, not specified as recurrent: Secondary | ICD-10-CM | POA: Diagnosis not present

## 2020-12-27 DIAGNOSIS — R1084 Generalized abdominal pain: Secondary | ICD-10-CM | POA: Diagnosis not present

## 2020-12-27 DIAGNOSIS — K5909 Other constipation: Secondary | ICD-10-CM | POA: Diagnosis not present

## 2021-01-17 DIAGNOSIS — K625 Hemorrhage of anus and rectum: Secondary | ICD-10-CM | POA: Diagnosis not present

## 2021-01-17 DIAGNOSIS — R194 Change in bowel habit: Secondary | ICD-10-CM | POA: Diagnosis not present

## 2021-01-17 DIAGNOSIS — K5900 Constipation, unspecified: Secondary | ICD-10-CM | POA: Diagnosis not present

## 2021-01-17 DIAGNOSIS — K602 Anal fissure, unspecified: Secondary | ICD-10-CM | POA: Diagnosis not present

## 2021-01-28 DIAGNOSIS — R194 Change in bowel habit: Secondary | ICD-10-CM | POA: Diagnosis not present

## 2021-02-05 ENCOUNTER — Other Ambulatory Visit: Payer: Self-pay | Admitting: Surgery

## 2021-02-05 DIAGNOSIS — R1031 Right lower quadrant pain: Secondary | ICD-10-CM | POA: Diagnosis not present

## 2021-02-06 ENCOUNTER — Other Ambulatory Visit: Payer: Self-pay | Admitting: Surgery

## 2021-02-06 DIAGNOSIS — R1031 Right lower quadrant pain: Secondary | ICD-10-CM

## 2021-02-12 ENCOUNTER — Ambulatory Visit
Admission: RE | Admit: 2021-02-12 | Discharge: 2021-02-12 | Disposition: A | Discharge status: Home / Self Care | Payer: BC Managed Care – PPO | Source: Ambulatory Visit | Attending: Surgery | Admitting: Surgery

## 2021-02-12 ENCOUNTER — Other Ambulatory Visit: Payer: Self-pay

## 2021-02-12 DIAGNOSIS — R1030 Lower abdominal pain, unspecified: Secondary | ICD-10-CM | POA: Diagnosis not present

## 2021-02-12 DIAGNOSIS — R1031 Right lower quadrant pain: Secondary | ICD-10-CM | POA: Insufficient documentation

## 2021-04-25 DIAGNOSIS — K625 Hemorrhage of anus and rectum: Secondary | ICD-10-CM | POA: Diagnosis not present

## 2021-04-25 DIAGNOSIS — K5900 Constipation, unspecified: Secondary | ICD-10-CM | POA: Diagnosis not present

## 2021-04-25 DIAGNOSIS — D122 Benign neoplasm of ascending colon: Secondary | ICD-10-CM | POA: Diagnosis not present

## 2021-04-25 DIAGNOSIS — R1011 Right upper quadrant pain: Secondary | ICD-10-CM | POA: Diagnosis not present

## 2021-04-25 DIAGNOSIS — K641 Second degree hemorrhoids: Secondary | ICD-10-CM | POA: Diagnosis not present

## 2021-07-18 DIAGNOSIS — K5909 Other constipation: Secondary | ICD-10-CM | POA: Diagnosis not present

## 2021-07-18 DIAGNOSIS — Z86010 Personal history of colonic polyps: Secondary | ICD-10-CM | POA: Diagnosis not present

## 2021-10-10 DIAGNOSIS — D2271 Melanocytic nevi of right lower limb, including hip: Secondary | ICD-10-CM | POA: Diagnosis not present

## 2021-10-10 DIAGNOSIS — D2262 Melanocytic nevi of left upper limb, including shoulder: Secondary | ICD-10-CM | POA: Diagnosis not present

## 2021-10-10 DIAGNOSIS — D2261 Melanocytic nevi of right upper limb, including shoulder: Secondary | ICD-10-CM | POA: Diagnosis not present

## 2021-10-10 DIAGNOSIS — D225 Melanocytic nevi of trunk: Secondary | ICD-10-CM | POA: Diagnosis not present

## 2021-10-14 ENCOUNTER — Ambulatory Visit
Admission: EM | Admit: 2021-10-14 | Discharge: 2021-10-14 | Disposition: A | Discharge status: Home / Self Care | Payer: BC Managed Care – PPO | Attending: Family Medicine | Admitting: Family Medicine

## 2021-10-14 DIAGNOSIS — G5602 Carpal tunnel syndrome, left upper limb: Secondary | ICD-10-CM

## 2021-10-14 MED ORDER — IBUPROFEN 800 MG PO TABS: 800.0000 mg | ORAL_TABLET | Freq: Two times a day (BID) | ORAL | 0 refills | Status: AC

## 2021-10-14 NOTE — ED Triage Notes (Signed)
Patient presents to Urgent Care with complaints of constant left shoulder pain x 2 weeks. He states he is unsure the cause. He is taking tylenol and ibuprofen for pain. Did have a massage that helped relieve pain.   Denies injury or changes in activity.

## 2021-10-14 NOTE — Discharge Instructions (Signed)
Take ibuprofen 800 mg twice daily for 7 days. Recommend applications of heat as needed to your shoulder and arm as needed.  For acute pain I have prescribed you tizanidine he can take 1 tablet up to twice daily as needed for acute pain.  Take caution when taking medication as it can cause severe drowsiness.  If symptoms do not improve follow-up with [facility]

## 2021-10-14 NOTE — ED Provider Notes (Incomplete)
Renaldo Fiddler    CSN: [id number] Arrival date & time: 10/14/21  1213      History   Chief Complaint Chief Complaint  Patient presents with  . Muscle Pain  . Shoulder Pain    HPI Benjamin Choi is a 30 y.o. male.   HPI Patient presents with two week history of what he initially describes as scapula (left) pain, left posterior / anterior neck radiating down left arm, left axilla, and terminating at his left thumb. Symptoms increase anxiety as  they've ben present for over 2 weeks with mild relief achieve with tylenol and ibuprofen which he admits only taking a couple of time.  He also has gotten 2 massages which the first massage helped some however the second massage was his deep tissue massage and he endorses experiencing more pain following that encounter.  He has full range of motion of his left arm but reports that he does perform repetitive hand movements at his job although denies any known injury.  No prior diagnosis of carpal tunnel. Past Medical History:  Diagnosis Date  . Bipolar disorder (HCC)   . Depression     Patient Active Problem List   Diagnosis Date Noted  . 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 Depression     History reviewed. No pertinent surgical history.     Home Medications    Prior to Admission medications   Medication Sig Start Date End Date Taking? Authorizing Provider  albuterol (VENTOLIN HFA) 108 (90 Base) MCG/ACT inhaler Inhale 2 puffs into the lungs every 4 (four) hours as needed for wheezing or shortness of breath. 10/07/20   Bing Neighbors, FNP  brompheniramine-pseudoephedrine-DM 30-2-10 MG/5ML syrup Take 5 mLs by mouth 3 (three) times daily as needed. 10/07/20   Bing Neighbors, FNP  QUEtiapine (SEROQUEL) 50 MG tablet Take 1 tablet (50 mg total) at bedtime by mouth. 01/16/17   Dorcas Carrow, DO    Family History Family History  Problem Relation Age of Onset  . Depression Mother     Social History Social History   Tobacco  Use  . Smoking status: Never  . Smokeless tobacco: Never  Substance Use Topics  . Alcohol use: Yes    Comment: socially  . Drug use: No     Allergies   Patient has no known allergies.   Review of Systems Review of Systems Pertinent negatives listed in HPI  Physical Exam Triage Vital Signs ED Triage Vitals  Enc Vitals Group     BP 10/14/21 1222 (!) 146/79     Pulse Rate 10/14/21 1222 68     Resp 10/14/21 1222 18     Temp 10/14/21 1222 98.1 F (36.7 C)     Temp src --      SpO2 10/14/21 1222 97 %     Weight --      Height --      Head Circumference --      Peak Flow --      Pain Score 10/14/21 1232 5     Pain Loc --      Pain Edu? --      Excl. in GC? --    No data found.  Updated Vital Signs BP (!) 146/79   Pulse 68   Temp 98.1 F (36.7 C)   Resp 18   SpO2 97%   Visual Acuity Right Eye Distance:   Left Eye Distance:   Bilateral Distance:    Right Eye Near:  Left Eye Near:    Bilateral Near:     Physical Exam   UC Treatments / Results  Labs (all labs ordered are listed, but only abnormal results are displayed) Labs Reviewed - No data to display  EKG   Radiology No results found.  Procedures Procedures (including critical care time)  Medications Ordered in UC Medications - No data to display  Initial Impression / Assessment and Plan / UC Course  I have reviewed the triage vital signs and the nursing notes.  Pertinent labs & imaging results that were available during my care of the patient were reviewed by me and considered in my medical decision making (see chart for details).     *** Final Clinical Impressions(s) / UC Diagnoses   Final diagnoses:  None   Discharge Instructions   None    ED Prescriptions   None    PDMP not reviewed this encounter.

## 2021-10-15 ENCOUNTER — Telehealth: Payer: Self-pay | Admitting: Family Medicine

## 2021-10-15 MED ORDER — TIZANIDINE HCL 2 MG PO TABS: 4.0000 mg | ORAL_TABLET | Freq: Every day | ORAL | 0 refills | Status: DC

## 2021-10-15 NOTE — Telephone Encounter (Signed)
Sent tizanidine per yesterday's encounter

## 2021-10-16 ENCOUNTER — Telehealth: Payer: Self-pay | Admitting: Family Medicine

## 2021-10-16 MED ORDER — TIZANIDINE HCL 2 MG PO TABS: 2.0000 mg | ORAL_TABLET | Freq: Two times a day (BID) | ORAL | 0 refills | Status: DC | PRN

## 2021-10-16 NOTE — Telephone Encounter (Signed)
Patient requested tizanidine to be sent [facility].

## 2021-10-18 ENCOUNTER — Telehealth: Payer: Self-pay

## 2021-10-18 MED ORDER — TIZANIDINE HCL 2 MG PO TABS: 2.0000 mg | ORAL_TABLET | Freq: Two times a day (BID) | ORAL | 0 refills | Status: DC | PRN

## 2021-10-18 NOTE — Telephone Encounter (Signed)
Patient calls [facility] to send Zanaflex prescription to pharmacy on file. Patient ha called 3x to have prescription, see encounters. Prescription was resent.

## 2022-03-29 DIAGNOSIS — M25531 Pain in right wrist: Secondary | ICD-10-CM | POA: Diagnosis not present

## 2022-04-10 DIAGNOSIS — M25531 Pain in right wrist: Secondary | ICD-10-CM | POA: Diagnosis not present

## 2022-05-04 ENCOUNTER — Ambulatory Visit (INDEPENDENT_AMBULATORY_CARE_PROVIDER_SITE_OTHER): Payer: BC Managed Care – PPO

## 2022-05-04 ENCOUNTER — Ambulatory Visit
Admission: EM | Admit: 2022-05-04 | Discharge: 2022-05-04 | Disposition: A | Discharge status: Home / Self Care | Payer: BC Managed Care – PPO | Attending: Nurse Practitioner | Admitting: Nurse Practitioner

## 2022-05-04 DIAGNOSIS — R109 Unspecified abdominal pain: Secondary | ICD-10-CM | POA: Diagnosis not present

## 2022-05-04 DIAGNOSIS — K5900 Constipation, unspecified: Secondary | ICD-10-CM | POA: Insufficient documentation

## 2022-05-04 DIAGNOSIS — J029 Acute pharyngitis, unspecified: Secondary | ICD-10-CM | POA: Diagnosis not present

## 2022-05-04 LAB — POCT RAPID STREP A (OFFICE): Rapid Strep A Screen: NEGATIVE

## 2022-05-04 NOTE — ED Provider Notes (Signed)
[facility]    CSN: [id number] Arrival date & time: 05/04/22  1132      History   Chief Complaint Chief Complaint  Patient presents with   Abdominal Pain   Sore Throat    HPI Benjamin Choi is a 32 y.o. male  presents for evaluation of URI symptoms for 4 days. Patient reports associated symptoms of ST. Denies N/V/D, ears, cough, congestion, body aches, ear pain, shortness of breath. Patient does not have a hx of asthma or smoking. No known sick contacts.  In addition he reports pain to the to the mid left abdomen only with certain positions like if he leans forward.  If he straightens out and tends to resolve.  States it feels like it is pushing against something.  He also states he feels like that side of his abdomen is bloated.  No vomiting diarrhea.  He reports a history of constipation and "stomach issues.  He has an appoint with a gastroenterologist in a couple of weeks.  He has been following with the [facility] clinic as well for his stomach issues.  He is otherwise eating and drinking normally.  Pt has no other concerns at this time.    Abdominal Pain Associated symptoms: sore throat   Sore Throat Associated symptoms include abdominal pain.    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 No pertinent surgical history.     Home Medications    Prior to Admission medications   Medication Sig Start Date End Date Taking? Authorizing Provider  albuterol (VENTOLIN HFA) 108 (90 Base) MCG/ACT inhaler Inhale 2 puffs into the lungs every 4 (four) hours as needed for wheezing or shortness of breath. 10/07/20   Scot Jun, NP  brompheniramine-pseudoephedrine-DM 30-2-10 MG/5ML syrup Take 5 mLs by mouth 3 (three) times daily as needed. 10/07/20   Scot Jun, NP  QUEtiapine (SEROQUEL) 50 MG tablet Take 1  tablet (50 mg total) at bedtime by mouth. 01/16/17   Johnson, Megan P, DO  tiZANidine (ZANAFLEX) 2 MG tablet Take 1 tablet (2 mg total) by mouth 2 (two) times daily as needed for muscle spasms. 10/18/21   Scot Jun, NP    Family History Family History  Problem Relation Age of Onset   Depression Mother     Social History Social History   Tobacco Use   Smoking status: Never   Smokeless tobacco: Never  Substance Use Topics   Alcohol use: Yes    Comment: socially   Drug use: No     Allergies   Patient has no known allergies.   Review of Systems Review of Systems  HENT:  Positive for sore throat.   Gastrointestinal:  Positive for abdominal pain.     Physical Exam Triage Vital Signs ED Triage Vitals [05/04/22 1154]  Enc Vitals Group     BP 132/80     Pulse Rate 71     Resp 17     Temp 98.3 F (36.8 C)     Temp Source Oral     SpO2 95 %     Weight      Height      Head Circumference      Peak Flow      Pain Score 4     Pain  Loc      Pain Edu?      Excl. in [place]?    No data found.  Updated Vital Signs BP 132/80 (BP Location: Right Arm)   Pulse 71   Temp 98.3 F (36.8 C) (Oral)   Resp 17   SpO2 95%   Visual Acuity Right Eye Distance:   Left Eye Distance:   Bilateral Distance:    Right Eye Near:   Left Eye Near:    Bilateral Near:     Physical Exam Vitals and nursing note reviewed.  Constitutional:      General: He is not in acute distress.    Appearance: Normal appearance. He is not ill-appearing or toxic-appearing.  HENT:     Head: Normocephalic and atraumatic.     Right Ear: Tympanic membrane and ear canal normal.     Left Ear: Tympanic membrane and ear canal normal.     Nose: No congestion or rhinorrhea.     Mouth/Throat:     Mouth: Mucous membranes are moist.     Pharynx: Posterior oropharyngeal erythema present.  Eyes:     Pupils: Pupils are equal, round, and reactive to light.  Cardiovascular:     Rate and Rhythm: Normal rate  and regular rhythm.     Heart sounds: Normal heart sounds.  Pulmonary:     Effort: Pulmonary effort is normal.     Breath sounds: Normal breath sounds.  Abdominal:     General: Bowel sounds are normal. There is no distension.     Palpations: Abdomen is soft. There is no hepatomegaly or splenomegaly.     Tenderness: There is no guarding.    Musculoskeletal:     Cervical back: Normal range of motion and neck supple.  Lymphadenopathy:     Cervical: No cervical adenopathy.  Skin:    General: Skin is warm and dry.  Neurological:     General: No focal deficit present.     Mental Status: He is alert and oriented to person, place, and time.  Psychiatric:        Mood and Affect: Mood normal.        Behavior: Behavior normal.      UC Treatments / Results  Labs (all labs ordered are listed, but only abnormal results are displayed) Labs Reviewed  CULTURE, GROUP A STREP [facility])  POCT RAPID STREP A (OFFICE)    EKG   Radiology DG Abd 1 View  Result Date: 05/04/2022 CLINICAL DATA:  RIGHT mid abdominal pain and history of constipation. EXAM: ABDOMEN - 1 VIEW COMPARISON:  July 01, 2010. FINDINGS: Moderate stool in the ascending colon, transverse and descending colon. Stool and gas in the area of the rectum. No signs of abnormal calcification over the LEFT and RIGHT renal contour though assessment is limited by overlying bowel gas. Upper abdomen including LEFT and RIGHT hemidiaphragm excluded from view. No signs of small bowel distension. Soft tissues are grossly normal. No acute skeletal findings on limited assessment. IMPRESSION: Moderate stool in the ascending colon, transverse and descending colon. No signs of small bowel obstruction. Upper abdomen excluded from view on this open "KUB". LEFT and RIGHT diaphragm not imaged. There is desire to assess for signs of free air repeat imaging could be considered as warranted. Electronically Signed   By: Zetta Bills M.D.   On: 05/04/2022 12:50     Procedures Procedures (including critical care time)  Medications Ordered in UC Medications - No data to display  Initial Impression /  Assessment and Plan / UC Course  I have reviewed the triage vital signs and the nursing notes.  Pertinent labs & imaging results that were available during my care of the patient were reviewed by me and considered in my medical decision making (see chart for details).     Reviewed exam and symptoms with patient.  No red flags. Negative rapid strep, will culture.  Discussed symptomatic treatment for pharyngitis with warm liquids and salt water gargles, OTC analgesics Reviewed abdominal x-ray with patient.  Discussed constipation.  Advised OTC MiraLAX or other laxative available.  Increase water and fiber Patient to follow-up with his PCP or gastroenterologist for further evaluation and treatment options Strict ER precautions reviewed and patient verbalized understanding Final Clinical Impressions(s) / UC Diagnoses   Final diagnoses:  Sore throat  Abdominal pain, unspecified abdominal location  Constipation, unspecified constipation type     Discharge Instructions      We will send your throat swab for culture and contact you with those results.  Over-the-counter ibuprofen or Tylenol as needed.  Warm liquids and salt water gargles Use over-the-counter laxative such as MiraLAX, Colace to help with constipation.  May also increase fluids and increase fiber Follow-up with your gastroenterologist for further evaluation and treatment Please go to the emergency room for any worsening symptoms     ED Prescriptions   None    PDMP not reviewed this encounter.   Melynda Ripple, NP 05/04/22 1318

## 2022-05-04 NOTE — Discharge Instructions (Signed)
We will send your throat swab for culture and contact you with those results.  Over-the-counter ibuprofen or Tylenol as needed.  Warm liquids and salt water gargles Use over-the-counter laxative such as MiraLAX, Colace to help with constipation.  May also increase fluids and increase fiber Follow-up with your gastroenterologist for further evaluation and treatment Please go to the emergency room for any worsening symptoms

## 2022-05-04 NOTE — ED Triage Notes (Signed)
Pt presents with c/o sore throat and lower abd pain x 4 days.   States he does not have any other sxs except the sore throat. States he feels he is swallowing knives.   Reports he feels he has balloon under his left rib cage.

## 2022-05-07 LAB — CULTURE, GROUP A STREP (THRC)

## 2022-05-22 DIAGNOSIS — Z86010 Personal history of colonic polyps: Secondary | ICD-10-CM | POA: Diagnosis not present

## 2022-05-22 DIAGNOSIS — K5909 Other constipation: Secondary | ICD-10-CM | POA: Diagnosis not present

## 2022-05-29 DIAGNOSIS — K5909 Other constipation: Secondary | ICD-10-CM | POA: Diagnosis not present

## 2022-06-05 DIAGNOSIS — K529 Noninfective gastroenteritis and colitis, unspecified: Secondary | ICD-10-CM | POA: Diagnosis not present

## 2022-06-24 ENCOUNTER — Encounter: Payer: Self-pay | Admitting: Nurse Practitioner

## 2022-06-24 ENCOUNTER — Ambulatory Visit: Payer: BC Managed Care – PPO | Admitting: Nurse Practitioner

## 2022-06-24 VITALS — BP 98/62 | HR 59 | Temp 98.3°F | Resp 16 | Ht 68.25 in | Wt 159.4 lb

## 2022-06-24 DIAGNOSIS — Z13220 Encounter for screening for lipoid disorders: Secondary | ICD-10-CM

## 2022-06-24 DIAGNOSIS — Z0000 Encounter for general adult medical examination without abnormal findings: Secondary | ICD-10-CM | POA: Diagnosis not present

## 2022-06-24 LAB — COMPREHENSIVE METABOLIC PANEL
ALT: 17 U/L (ref 0–53)
AST: 20 U/L (ref 0–37)
Albumin: 4.5 g/dL (ref 3.5–5.2)
Alkaline Phosphatase: 54 U/L (ref 39–117)
BUN: 11 mg/dL (ref 6–23)
CO2: 30 mEq/L (ref 19–32)
Calcium: 9.3 mg/dL (ref 8.4–10.5)
Chloride: 102 mEq/L (ref 96–112)
Creatinine, Ser: 0.93 mg/dL (ref 0.40–1.50)
GFR: 110.79 mL/min (ref >60.00)
Glucose, Bld: 83 mg/dL (ref 70–99)
Potassium: 4.2 mEq/L (ref 3.5–5.1)
Sodium: 138 mEq/L (ref 135–145)
Total Bilirubin: 1.4 mg/dL — ABNORMAL HIGH (ref 0.2–1.2)
Total Protein: 7 g/dL (ref 6.0–8.3)

## 2022-06-24 LAB — LIPID PANEL
Cholesterol: 172 mg/dL (ref 0–200)
HDL: 54.1 mg/dL (ref >39.00)
LDL Cholesterol: 100 mg/dL — ABNORMAL HIGH (ref 0–99)
NonHDL: 117.57
Total CHOL/HDL Ratio: 3
Triglycerides: 87 mg/dL (ref 0.0–149.0)
VLDL: 17.4 mg/dL (ref 0.0–40.0)

## 2022-06-24 LAB — CBC
HCT: 45.8 % (ref 39.0–52.0)
Hemoglobin: 15.6 g/dL (ref 13.0–17.0)
MCHC: 34.1 g/dL (ref 30.0–36.0)
MCV: 92.8 fl (ref 78.0–100.0)
Platelets: 248 10*3/uL (ref 150.0–400.0)
RBC: 4.94 Mil/uL (ref 4.22–5.81)
RDW: 13.2 % (ref 11.5–15.5)
WBC: 4.4 10*3/uL (ref 4.0–10.5)

## 2022-06-24 LAB — TSH: TSH: 1.1 u[IU]/mL (ref 0.35–5.50)

## 2022-06-24 NOTE — Assessment & Plan Note (Signed)
Discussed age appropriate immunizations and screening exams. Reviewed persona;, surgical, social, and family history. Patient refused vaccinations. Patient was given information at discharge about health care maintenance with anticipatory guidance for his age range

## 2022-06-24 NOTE — Patient Instructions (Signed)
Nice to see you today I will be in touch with the labs once I have them Follow up with me in 1 year for your next physical and labs, sooner if you need me 

## 2022-06-24 NOTE — Progress Notes (Signed)
New Patient Office Visit  Subjective    Patient ID: Benjamin Choi, male    DOB: [birth year]  Age: 32 y.o. MRN: [id number]  CC:  Chief Complaint  Patient presents with   Establish Care   Annual Exam    HPI Benjamin Choi presents to establish care  Bipolar: historical dx. Patient is no on any medications or being followed by [facility] currently  Anxiety: same as above   for complete physical and follow up of chronic conditions.  Immunizations: -Tetanus: refused -Influenza: refused -Shingles: too young -Pneumonia: too young  Diet: Fair diet. States that he does 3 meals a day and protein. Water gatorade and sweet Exercise: No regular exercise. Yard with the kids. Gym 2-3 times weight 45-1.5 hours   Eye exam: prn Dental exam: Completes semi-annually    Colonoscopy: Completed in 2023. Is followed by [facility] Scre[facility]: NA   PSA: Too young currently average risk   Sleep: states that he goes to bed around 9 and gets up at 5. Feels rested some days. Does not snore. States that he does take melatonin to help him wind down  HS     Outpatient Encounter Medications as of 06/24/2022  Medication Sig   fluticasone (FLONASE) 50 MCG/ACT nasal spray Place 2 sprays into both nostrils daily.   loratadine (CLARITIN) 10 MG tablet Take 10 mg by mouth daily.   Melatonin 10 MG TABS Take 10 mg by mouth at bedtime.   [DISCONTINUED] albuterol (VENTOLIN HFA) 108 (90 Base) MCG/ACT inhaler Inhale 2 puffs into the lungs every 4 (four) hours as needed for wheezing or shortness of breath. (Patient not taking: Reported on 06/24/2022)   [DISCONTINUED] brompheniramine-pseudoephedrine-DM 30-2-10 MG/5ML syrup Take 5 mLs by mouth 3 (three) times daily as needed. (Patient not taking: Reported on 06/24/2022)   [DISCONTINUED] QUEtiapine (SEROQUEL) 50 MG tablet Take 1 tablet (50 mg total) at bedtime by mouth. (Patient not taking: Reported on 06/24/2022)   [DISCONTINUED] tiZANidine (ZANAFLEX) 2 MG  tablet Take 1 tablet (2 mg total) by mouth 2 (two) times daily as needed for muscle spasms. (Patient not taking: Reported on 06/24/2022)   No facility-administered encounter medications on file as of 06/24/2022.    Past Medical History:  Diagnosis Date   Bipolar disorder    Depression     History reviewed. No pertinent surgical history.  Family History  Problem Relation Age of Onset   Arthritis Mother    Depression Mother    Cancer Paternal Grandmother        skin cancer    Social History   Socioeconomic History   Marital status: Married    Spouse name: Benjamin Choi   Number of children: 1   Years of education: Not on file   Highest education level: Not on file  Occupational History   Not on file  Tobacco Use   Smoking status: Never   Smokeless tobacco: Never  Vaping Use   Vaping Use: Never used  Substance and Sexual Activity   Alcohol use: Yes    Comment: socially   Drug use: No   Sexual activity: Yes    Birth control/protection: None  Other Topics Concern   Not on file  Social History Narrative   Fulltime: Chiropractor (3)   Benjamin Choi (8)      Hobbies: play music and dirt bikes    Social Determinants of Health   Financial Resource Strain: Not on file  Food  Insecurity: Not on file  Transportation Needs: Not on file  Physical Activity: Not on file  Stress: Not on file  Social Connections: Not on file  Intimate Partner Violence: Not on file    Review of Systems  Constitutional:  Negative for chills and fever.  Respiratory:  Negative for shortness of breath.   Cardiovascular:  Negative for chest pain and leg swelling.  Gastrointestinal:  Negative for abdominal pain, blood in stool, constipation, diarrhea, nausea and vomiting.       BM every other day   Genitourinary:  Negative for dysuria and hematuria.  Neurological:  Negative for tingling and headaches.  Psychiatric/Behavioral:  Negative for hallucinations and suicidal ideas.          Objective    BP 98/62   Pulse (!) 59   Temp 98.3 F (36.8 C)   Resp 16   Ht 5' 8.25" (1.734 m)   Wt 159 lb 6 oz (72.3 kg)   SpO2 98%   BMI 24.06 kg/m   Physical Exam Vitals and nursing note reviewed. Exam conducted with a chaperone present Benjamin Choi, CMA).  Constitutional:      Appearance: Normal appearance.  HENT:     Right Ear: Tympanic membrane, ear canal and external ear normal.     Left Ear: Tympanic membrane, ear canal and external ear normal.     Mouth/Throat:     Mouth: Mucous membranes are moist.     Pharynx: Oropharynx is clear.  Eyes:     Extraocular Movements: Extraocular movements intact.     Pupils: Pupils are equal, round, and reactive to light.  Cardiovascular:     Rate and Rhythm: Normal rate and regular rhythm.     Pulses: Normal pulses.     Heart sounds: Normal heart sounds.  Pulmonary:     Effort: Pulmonary effort is normal.     Breath sounds: Normal breath sounds.  Abdominal:     General: Bowel sounds are normal. There is no distension.     Palpations: There is no mass.     Tenderness: There is no abdominal tenderness.     Hernia: No hernia is present. There is no hernia in the left inguinal area or right inguinal area.  Genitourinary:    Penis: Normal.      Testes: Normal.     Epididymis:     Right: Normal.     Left: Normal.  Musculoskeletal:     Right lower leg: No edema.     Left lower leg: No edema.  Lymphadenopathy:     Cervical: No cervical adenopathy.     Lower Body: No right inguinal adenopathy. No left inguinal adenopathy.  Skin:    General: Skin is warm.  Neurological:     General: No focal deficit present.     Mental Status: He is alert.     Deep Tendon Reflexes:     Reflex Scores:      Bicep reflexes are 2+ on the right side and 2+ on the left side.      Patellar reflexes are 2+ on the right side and 2+ on the left side.    Comments: Bilateral upper and lower extremity strength 5/5  Psychiatric:        Mood and  Affect: Mood normal.        Behavior: Behavior normal.        Thought Content: Thought content normal.        Judgment: Judgment normal.  Assessment & Plan:   Problem List Items Addressed This Visit       Other   Screening for lipid disorders   Relevant Orders   Lipid panel   Preventative health care - Primary    Discussed age appropriate immunizations and screening exams. Reviewed persona;, surgical, social, and family history. Patient refused vaccinations. Patient was given information at discharge about health care maintenance with anticipatory guidance for his age range       Relevant Orders   CBC   Comprehensive metabolic panel   TSH    Return in about 1 year (around 06/24/2023) for CPE and Labs.   Audria Nine, NP

## 2022-12-15 ENCOUNTER — Encounter: Payer: Self-pay | Admitting: Internal Medicine

## 2022-12-15 ENCOUNTER — Ambulatory Visit: Payer: BC Managed Care – PPO | Admitting: Internal Medicine

## 2022-12-15 VITALS — BP 104/70 | HR 60 | Temp 98.3°F | Wt 168.0 lb

## 2022-12-15 DIAGNOSIS — Z23 Encounter for immunization: Secondary | ICD-10-CM | POA: Diagnosis not present

## 2022-12-15 DIAGNOSIS — T148XXA Other injury of unspecified body region, initial encounter: Secondary | ICD-10-CM | POA: Insufficient documentation

## 2022-12-15 MED ORDER — CEPHALEXIN 500 MG PO CAPS: 500.0000 mg | ORAL_CAPSULE | Freq: Three times a day (TID) | ORAL | 0 refills | Status: DC

## 2022-12-15 NOTE — Addendum Note (Signed)
Addended by: Eual Fines on: 12/15/2022 04:26 PM   Modules accepted: Orders

## 2022-12-15 NOTE — Progress Notes (Signed)
   Subjective:    Patient ID: Benjamin Choi, male    DOB: 12-10-1990, 32 y.o.   MRN: [id number]  HPI Here due to a puncture wound to his head  Was crawling up a ladder rope at [employer] yesterday There was a nail sticking out of the cushions and it went into back of head Sandi Mealy quite a bit Some pain---and it is still sore Some ongoing headache  Went to first aid--EMS person Cleaned with antibacterial pad  No other Rx No meds  Current Outpatient Medications on File Prior to Visit  Medication Sig Dispense Refill   fluticasone (FLONASE) 50 MCG/ACT nasal spray Place 2 sprays into both nostrils daily.     loratadine (CLARITIN) 10 MG tablet Take 10 mg by mouth daily.     Melatonin 10 MG TABS Take 10 mg by mouth at bedtime.     No current facility-administered medications on file prior to visit.    No Known Allergies  Past Medical History:  Diagnosis Date   Bipolar disorder (HCC)    Depression     History reviewed. No pertinent surgical history.  Family History  Problem Relation Age of Onset   Arthritis Mother    Depression Mother    Cancer Paternal Grandmother        skin cancer    Social History   Socioeconomic History   Marital status: Married    Spouse name: Harvin Hazel   Number of children: 1   Years of education: Not on file   Highest education level: Not on file  Occupational History   Not on file  Tobacco Use   Smoking status: Never   Smokeless tobacco: Never  Vaping Use   Vaping status: Never Used  Substance and Sexual Activity   Alcohol use: Yes    Comment: socially   Drug use: No   Sexual activity: Yes    Birth control/protection: None  Other Topics Concern   Not on file  Social History Narrative   Fulltime: Chiropractor (3)   [employer] (6)      Hobbies: play music and dirt bikes    Social Determinants of Health   Financial Resource Strain: Not on file  Food Insecurity: Not on file  Transportation Needs: Not on file  Physical  Activity: Not on file  Stress: Not on file  Social Connections: Not on file  Intimate Partner Violence: Not on file   Review of Systems Went to work---no problems at work Some anxiety about the whole episode Didn't sleep well last night     Objective:   Physical Exam Constitutional:      Appearance: Normal appearance.  HENT:     Head:     Comments: Puncture wound on left occiput Slight surrounding redness--mild tenderness Neurological:     Mental Status: He is alert.            Assessment & Plan:

## 2022-12-15 NOTE — Assessment & Plan Note (Signed)
Not plantar  Tenderness might be just from the trauma--but will try empiric cephalexin 500 tid x 5 days Ibuprofen 600 tid prn He is working with [employer] about reimbursement

## 2023-02-16 ENCOUNTER — Ambulatory Visit: Payer: BC Managed Care – PPO | Admitting: Family Medicine

## 2023-02-16 ENCOUNTER — Encounter: Payer: Self-pay | Admitting: Family Medicine

## 2023-02-16 VITALS — BP 126/68 | HR 76 | Temp 97.6°F | Ht 68.25 in | Wt 168.2 lb

## 2023-02-16 DIAGNOSIS — K148 Other diseases of tongue: Secondary | ICD-10-CM | POA: Diagnosis not present

## 2023-02-16 MED ORDER — TRIAMCINOLONE ACETONIDE 0.1 % MT PSTE: 1.0000 | PASTE | Freq: Three times a day (TID) | OROMUCOSAL | 1 refills | Status: DC

## 2023-02-16 NOTE — Progress Notes (Signed)
Ph: [phone] Fax: [phone]   Patient ID: Benjamin Choi, male    DOB: 02/26/1991, 32 y.o.   MRN: [id number]  This visit was conducted in person.  BP 126/68   Pulse 76   Temp 97.6 F (36.4 C) (Oral)   Ht 5' 8.25" (1.734 m)   Wt 168 lb 4 oz (76.3 kg)   SpO2 96%   BMI 25.40 kg/m    CC: tongue laceration  Subjective:   HPI: Benjamin Choi is a 32 y.o. male presenting on 02/16/2023 for Mouth Injury (C/o laceration to tongue on R side. States teeth are really sharp on R side and irritate area on tongue. Seen by dentist to have work done on teeth. )   For last few months, notes right lower teeth irritate bottom of tongue on the right. Now noticing raw spot to R lower tongue that is extra tender for the last few days. Finds hard foods aggravate area.   He is getting over head cold last week with cough and ST. Symptoms continue to improve. No other skin rash.  No sick contacts at home.    Had dental work recently - cavities. Had dentist shave R lower teeth some.   No new supplement, vitamins.  He has been using different toothpaste.  Brushes teeth regularly, flossing.  No smoking, no smokeless tobacco use.   Tdap updated 12/15/2022 after puncture wound to posterior head. He did take keflex course.      Relevant past medical, surgical, family and social history reviewed and updated as indicated. Interim medical history since our last visit reviewed. Allergies and medications reviewed and updated. Outpatient Medications Prior to Visit  Medication Sig Dispense Refill   fluticasone (FLONASE) 50 MCG/ACT nasal spray Place 2 sprays into both nostrils daily. As needed     loratadine (CLARITIN) 10 MG tablet Take 10 mg by mouth daily.     Melatonin 10 MG TABS Take 10 mg by mouth at bedtime.     cephALEXin (KEFLEX) 500 MG capsule Take 1 capsule (500 mg total) by mouth 3 (three) times daily. 15 capsule 0   No facility-administered medications prior to visit.     Per HPI unless  specifically indicated in ROS section below Review of Systems  Objective:  BP 126/68   Pulse 76   Temp 97.6 F (36.4 C) (Oral)   Ht 5' 8.25" (1.734 m)   Wt 168 lb 4 oz (76.3 kg)   SpO2 96%   BMI 25.40 kg/m   Wt Readings from Last 3 Encounters:  02/16/23 168 lb 4 oz (76.3 kg)  12/15/22 168 lb (76.2 kg)  06/24/22 159 lb 6 oz (72.3 kg)      Physical Exam Vitals and nursing note reviewed.  Constitutional:      Appearance: Normal appearance. He is not ill-appearing.  HENT:     Head: Normocephalic and atraumatic.     Mouth/Throat:     Mouth: Mucous membranes are moist.     Dentition: Normal dentition. No gingival swelling, dental abscesses or gum lesions.     Tongue: Lesions present.     Pharynx: Oropharynx is clear. No oropharyngeal exudate or posterior oropharyngeal erythema.      Comments: Erythematous area to right lateral anterior and posterior tongue with mild edema to areas as well as small ulcer Eyes:     Extraocular Movements: Extraocular movements intact.     Pupils: Pupils are equal, round, and reactive to light.  Musculoskeletal:  Cervical back: Normal range of motion and neck supple.  Lymphadenopathy:     Cervical: No cervical adenopathy.  Neurological:     Mental Status: He is alert.       Lab Results  Component Value Date   TSH 1.10 06/24/2022   Assessment & Plan:   Problem List Items Addressed This Visit     Tongue lesion - Primary    Anticipate irritation from teeth causing worsening pain and small ulcer, possible exacerbated post-viral ulcer given recent head cold.  Non smoker, no smokeless tobacco use.  Recommend change tooth paste brand.  Recommend continue salt water gargles, avoid foods that aggravate symptoms (hard foods, spicy foods, acidic foods) start triamcinolone paste TID PRN to affected area. Discussed Cepacol throat lozenge use.  Update if not improved with this.         Meds ordered this encounter  Medications    triamcinolone (KENALOG) 0.1 % paste    Sig: Use as directed 1 Application in the mouth or throat 3 (three) times daily.    Dispense:  5 g    Refill:  1    No orders of the defined types were placed in this encounter.   Patient Instructions  Try triamcinolone paste to affected area of tongue.  Continue salt water gargles May also use tylenol 500mg  or ibuprofen 400mg  as needed.  Try Cepacol sore throat lozenge you can find over the counter to numb tongue.  Push fluids and let us know if not improving with this.   Follow up plan: Return if symptoms worsen or fail to improve.  Eustaquio Boyden, MD

## 2023-02-16 NOTE — Assessment & Plan Note (Addendum)
Anticipate irritation from teeth causing worsening pain and small ulcer, possible exacerbated post-viral ulcer given recent head cold.  Non smoker, no smokeless tobacco use.  Recommend change tooth paste brand.  Recommend continue salt water gargles, avoid foods that aggravate symptoms (hard foods, spicy foods, acidic foods) start triamcinolone paste TID PRN to affected area. Discussed Cepacol throat lozenge use.  Update if not improved with this.

## 2023-02-16 NOTE — Patient Instructions (Addendum)
Try triamcinolone paste to affected area of tongue.  Continue salt water gargles May also use tylenol 500mg  or ibuprofen 400mg  as needed.  Try Cepacol sore throat lozenge you can find over the counter to numb tongue.  Push fluids and let us know if not improving with this.

## 2023-04-22 ENCOUNTER — Ambulatory Visit: Payer: BC Managed Care – PPO | Admitting: Nurse Practitioner

## 2023-04-22 VITALS — BP 120/80 | HR 63 | Temp 98.6°F | Ht 68.25 in | Wt 175.4 lb

## 2023-04-22 DIAGNOSIS — M792 Neuralgia and neuritis, unspecified: Secondary | ICD-10-CM

## 2023-04-22 DIAGNOSIS — J309 Allergic rhinitis, unspecified: Secondary | ICD-10-CM | POA: Diagnosis not present

## 2023-04-22 MED ORDER — FLUTICASONE PROPIONATE 50 MCG/ACT NA SUSP: 2.0000 | Freq: Every day | NASAL | 1 refills | Status: DC

## 2023-04-22 MED ORDER — PREDNISONE 20 MG PO TABS: ORAL_TABLET | ORAL | 0 refills | Status: AC

## 2023-04-22 MED ORDER — CETIRIZINE HCL 10 MG PO TABS: 10.0000 mg | ORAL_TABLET | Freq: Every day | ORAL | 0 refills | Status: DC

## 2023-04-22 NOTE — Progress Notes (Signed)
Acute Office Visit  Subjective:     Patient ID: Benjamin Choi, male    DOB: [birth year], 33 y.o.   MRN: [id number]  Chief Complaint  Patient presents with   Arm Pain    Last year in July had throbbing pain in left arm. Was told it was nerve related. Pt complains of left arm pain and muscle burning in left chest area. States it feels like being "zapped"  Pain could last all day.     Patient is in today for pain in the L shoulder as his primary complaint and he would also like to address seasonal allergy symptoms. This first started in July after he went to the chiropractor. Pt went to the ED was was told he had some nerve pain and was given steroids which relieved the pain at that time. This episode started 2-3 days ago with yesterday being the worst and is intermittent in duration. Pt denies any specific injury. The pain is located in the front and rear of the shoulder and is felt intermittently in the L pectoral region. It is described as sharp and jolting and when it catches with random movements, he has radiation down the inner arm. He has tenderness to the shoulder when he lays on it. Denies any ShOB or palpitations.   Pt has a hx of seasonal allergies. Pt has been having nasal congestion, drainage, watery eyes, and sneezing. Pt has previously used fluticasone nasal spray with relief. He has not taken anything recently for his symptoms.   Review of Systems  Constitutional:  Negative for chills, fever and malaise/fatigue.  HENT:  Positive for congestion.   Respiratory:  Negative for cough and shortness of breath.   Cardiovascular:  Positive for chest pain. Negative for palpitations.  Gastrointestinal:  Negative for nausea and vomiting.  Musculoskeletal:  Positive for joint pain.  Neurological:  Negative for dizziness.        Objective:    BP 120/80   Pulse 63   Temp 98.6 F (37 C) (Oral)   Ht 5' 8.25" (1.734 m)   Wt 79.6 kg   SpO2 98%   BMI 26.47 kg/m    Physical  Exam Constitutional:      Appearance: Normal appearance.  Cardiovascular:     Rate and Rhythm: Normal rate and regular rhythm.     Pulses: Normal pulses.     Heart sounds: Normal heart sounds. No murmur heard.    No friction rub. No gallop.  Pulmonary:     Effort: Pulmonary effort is normal.     Breath sounds: Normal breath sounds.  Musculoskeletal:        General: Normal range of motion.     Right shoulder: Normal. Normal pulse.     Left shoulder: Normal pulse.     Cervical back: Normal range of motion. No tenderness.     Comments: Muscle tone is symmetrical to pectoral, deltoid, and trapezius.  No cervical paraspinal, SITS muscle, deltoid tenderness or tightness noted on left. No pain on arc test, Neers, Hawkins, Liftoff is negative. No laxity noted. Pain is not reproducible.   Neurological:     Mental Status: He is alert.     No results found for any visits on 04/22/23.      Assessment & Plan:   Problem List Items Addressed This Visit     Radicular pain in left arm - Primary   Prednisone taper 40 mg daily for three days followed by 20 mg daily  for three days. Discussed at home shoulder exercises.       Relevant Medications   predniSONE (DELTASONE) 20 MG tablet   Allergic rhinitis   Start Fluticasone 40 mcg/act 2 sprays per nostril daily. Zyrtec 10 mg daily.       Relevant Medications   fluticasone (FLONASE) 50 MCG/ACT nasal spray   cetirizine (ZYRTEC) 10 MG tablet    Meds ordered this encounter  Medications   predniSONE (DELTASONE) 20 MG tablet    Sig: Take 2 tablets (40 mg total) by mouth daily with breakfast for 3 days, THEN 1 tablet (20 mg total) daily with breakfast for 3 days. Avoid NSAIDs.    Dispense:  9 tablet    Refill:  0   fluticasone (FLONASE) 50 MCG/ACT nasal spray    Sig: Place 2 sprays into both nostrils daily.    Dispense:  16 g    Refill:  1   cetirizine (ZYRTEC) 10 MG tablet    Sig: Take 1 tablet (10 mg total) by mouth daily.    Dispense:   30 tablet    Refill:  0    Return in about 3 months (around 07/20/2023) for CPE.  Murvin Donning, RN

## 2023-04-22 NOTE — Patient Instructions (Addendum)
It was a pleasure to see you today.  Prescriptions have been sent to your pharmacy. Prednisone for your shoulder pain and Flonase and Zyrtec for your allergies.  Please follow up if your symptoms don't improve otherwise follow up in 3 months for your annual physical.

## 2023-04-22 NOTE — Progress Notes (Signed)
Acute Office Visit  Subjective:     Patient ID: Benjamin Choi, male    DOB: 1990-06-10, 33 y.o.   MRN: [id number]  Chief Complaint  Patient presents with   Arm Pain    Last year in July had throbbing pain in left arm. Was told it was nerve related. Pt complains of left arm pain and muscle burning in left chest area. States it feels like being "zapped"  Pain could last all day.     HPI Patient is in today for shoudler pain with a history that is non contributory   Symptoms started in July after he visited a chiropractor he was evaluated in the ed there after and told that he had nerve pain and was given steroids that helped at that time. This episode started 2-3 days ago without an acute injury. It is intermittent in nature and described as sharp and jolting. The worst was yesterday. It is in the shoulder and will radiate to the left chest and down the left inner arm.  Patient describes the pain being the same as it was in July.  He describes that sometimes he cannot get his arm comfortable.  No personal cardiac history or family history of cardiac events.  Patient does lift stuff at work but nothing out of his normal.  Patient has not been in the gym in the past year.  States that him and his wife are building a house he was cutting trees down and doing that type of work but that has been an extended period of time ago nothing recently.   Seasonal allergies: Patient states has been having seasonal allergies for some time.  He was using Flonase nasal spray that seemed to help but has not used that in an extended period time.  Also patient was using an over-the-counter antihistamine has been off of that as of late.  His main complaint is he cannot breathe through his nose.  No history of nasal trauma or previous deviated septum.  Review of Systems  Constitutional:  Negative for chills and fever.  Respiratory:  Negative for shortness of breath.   Cardiovascular:  Negative for chest pain.   Musculoskeletal:  Positive for joint pain.  Neurological:  Negative for tingling, weakness and headaches.        Objective:    BP 120/80   Pulse 63   Temp 98.6 F (37 C) (Oral)   Ht 5' 8.25" (1.734 m)   Wt 175 lb 6.4 oz (79.6 kg)   SpO2 98%   BMI 26.47 kg/m    Physical Exam Vitals and nursing note reviewed.  Constitutional:      Appearance: Normal appearance.  Cardiovascular:     Rate and Rhythm: Normal rate and regular rhythm.     Pulses:          Radial pulses are 2+ on the right side and 2+ on the left side.     Heart sounds: Normal heart sounds.  Pulmonary:     Effort: Pulmonary effort is normal.     Breath sounds: Normal breath sounds.  Musculoskeletal:        General: No tenderness.     Right shoulder: Normal strength. Normal pulse.     Left shoulder: No tenderness or bony tenderness. Normal range of motion. Normal strength. Normal pulse.     Comments: Full range of motion  Neurological:     Mental Status: He is alert.     Deep Tendon Reflexes:  Reflex Scores:      Bicep reflexes are 2+ on the right side and 2+ on the left side.    No results found for any visits on 04/22/23.      Assessment & Plan:   Problem List Items Addressed This Visit       Respiratory   Allergic rhinitis   Start Fluticasone 50 mcg/act 2 sprays per nostril daily. Zyrtec 10 mg daily.   I evaluated patient, was consulted regarding treatment, and agree with assessment and plan per Denice Bors RN, FNP Student   Audria Nine, DNP, AGNP-C       Relevant Medications   fluticasone (FLONASE) 50 MCG/ACT nasal spray   cetirizine (ZYRTEC) 10 MG tablet     Other   Radicular pain in left arm - Primary   Prednisone taper 40 mg daily for three days followed by 20 mg daily for three days. Discussed at home shoulder exercises. Prednisone precautions discussed with patient  I evaluated patient, was consulted regarding treatment, and agree with assessment and plan per Denice Bors RN, FNP  Student   Audria Nine, DNP, AGNP-C        Relevant Medications   predniSONE (DELTASONE) 20 MG tablet    Meds ordered this encounter  Medications   predniSONE (DELTASONE) 20 MG tablet    Sig: Take 2 tablets (40 mg total) by mouth daily with breakfast for 3 days, THEN 1 tablet (20 mg total) daily with breakfast for 3 days. Avoid NSAIDs.    Dispense:  9 tablet    Refill:  0   fluticasone (FLONASE) 50 MCG/ACT nasal spray    Sig: Place 2 sprays into both nostrils daily.    Dispense:  16 g    Refill:  1   cetirizine (ZYRTEC) 10 MG tablet    Sig: Take 1 tablet (10 mg total) by mouth daily.    Dispense:  30 tablet    Refill:  0    Return in about 3 months (around 07/20/2023) for CPE.  Audria Nine, NP

## 2023-04-22 NOTE — Assessment & Plan Note (Addendum)
Prednisone taper 40 mg daily for three days followed by 20 mg daily for three days. Discussed at home shoulder exercises. Prednisone precautions discussed with patient  I evaluated patient, was consulted regarding treatment, and agree with assessment and plan per Denice Bors RN, FNP Student   Audria Nine, DNP, AGNP-C

## 2023-04-22 NOTE — Assessment & Plan Note (Addendum)
Start Fluticasone 50 mcg/act 2 sprays per nostril daily. Zyrtec 10 mg daily.   I evaluated patient, was consulted regarding treatment, and agree with assessment and plan per Denice Bors RN, FNP Student   Audria Nine, DNP, AGNP-C

## 2023-05-14 ENCOUNTER — Other Ambulatory Visit: Payer: Self-pay | Admitting: Nurse Practitioner

## 2023-05-14 DIAGNOSIS — J309 Allergic rhinitis, unspecified: Secondary | ICD-10-CM

## 2023-06-15 ENCOUNTER — Ambulatory Visit: Payer: Self-pay

## 2023-06-15 DIAGNOSIS — J309 Allergic rhinitis, unspecified: Secondary | ICD-10-CM

## 2023-06-15 NOTE — Telephone Encounter (Signed)
 Pt calling in due to sore throat caused by PND related to pt's allergies. Pt wanting to start taking Allegra, advised Allegra is okay in place of the Zyrtec however he should not take both. Pt reports bno other symptoms, this RN advised pt to return call to office with new/worsening symptoms. Pt verbalized understanding and agrees to plan.  Copied from CRM [id number]. Topic: General - Other >> Jun 15, 2023  5:04 PM Emylou G wrote: Reason for CRM: Patient called.Marland Kitchen looking to see if he can take allegra w/his current meds - feels he has sore throat due to the allergies.. Pls call patient number on file.. Reason for Disposition  Health Information question, no triage required and triager able to answer question  Answer Assessment - Initial Assessment Questions 1. REASON FOR CALL or QUESTION: "What is your reason for calling today?" or "How can I best help you?" or "What question do you have that I can help answer?"     Pt calling in about allergy medication  Protocols used: Information Only Call - No Triage-A-AH

## 2023-06-16 NOTE — Telephone Encounter (Signed)
 Returned pt call.  Pt states that he is using the flonase spray and thinks he needs a refill for cetrizine.  Pt also complains that he wants to switch to allegra and would like a prescription sent in.

## 2023-06-16 NOTE — Telephone Encounter (Signed)
 Agree with information. He can also use flonase if he is not already to help with the PND. The flonase can cause nose bleeds. If that happens discontinue the flonase

## 2023-06-17 ENCOUNTER — Other Ambulatory Visit: Payer: Self-pay | Admitting: Nurse Practitioner

## 2023-06-17 DIAGNOSIS — J309 Allergic rhinitis, unspecified: Secondary | ICD-10-CM

## 2023-06-17 MED ORDER — FEXOFENADINE HCL 180 MG PO TABS: 180.0000 mg | ORAL_TABLET | Freq: Every day | ORAL | 1 refills | Status: DC

## 2023-06-17 MED ORDER — FLUTICASONE PROPIONATE 50 MCG/ACT NA SUSP: 2.0000 | Freq: Every day | NASAL | 1 refills | Status: DC

## 2023-06-17 NOTE — Addendum Note (Signed)
 Addended by: Eden Emms on: 06/17/2023 07:49 AM   Modules accepted: Orders

## 2023-06-17 NOTE — Telephone Encounter (Addendum)
 Contacted pt. Informed him of script of allegra sent into pharmacy.  Advised to not take cetrizine at the same time.  Informed him that it will not be refilled. Pt verbalized understanding.  Pt states that its fine and that he is starting to feel better due to rain but is still having symptoms. Pt states that if he does not get better with allegra he will call the office.

## 2023-06-17 NOTE — Telephone Encounter (Signed)
 I will send in Allegra but not refill the cetrizine because he will need to take one or the other not both

## 2023-06-29 ENCOUNTER — Telehealth: Payer: Self-pay

## 2023-06-29 DIAGNOSIS — J309 Allergic rhinitis, unspecified: Secondary | ICD-10-CM

## 2023-06-29 MED ORDER — FEXOFENADINE HCL 180 MG PO TABS: 180.0000 mg | ORAL_TABLET | Freq: Every day | ORAL | 1 refills | Status: DC

## 2023-06-29 NOTE — Telephone Encounter (Signed)
 Called and spoke to pt to make sure he wanted an Rx since they are OTC. Did he have a [employer] account. He said his job helps pay for OTC meds. [facility] stated [facility] this month. I have sent in the rx.

## 2023-06-29 NOTE — Telephone Encounter (Signed)
 Copied from CRM [id number]. Topic: Clinical - Medication Question >> Jun 29, 2023 12:49 PM Ivette P wrote: Reason for CRM: Pt called in to see about getting the new allergy Alegra medication instead of the usual Zyrtec  he is prescribed. Pt would like it sent to pharmacy and would like a call once sent       PT callback [phone]

## 2023-07-30 ENCOUNTER — Ambulatory Visit: Admitting: Nurse Practitioner

## 2023-07-30 ENCOUNTER — Other Ambulatory Visit: Payer: Self-pay | Admitting: Nurse Practitioner

## 2023-07-30 VITALS — BP 134/80 | HR 73 | Temp 98.1°F | Ht 68.25 in | Wt 166.6 lb

## 2023-07-30 DIAGNOSIS — K148 Other diseases of tongue: Secondary | ICD-10-CM | POA: Diagnosis not present

## 2023-07-30 MED ORDER — LIDOCAINE VISCOUS HCL 2 % MT SOLN: 5.0000 mL | Freq: Four times a day (QID) | OROMUCOSAL | 0 refills | Status: DC

## 2023-07-30 NOTE — Progress Notes (Signed)
   Acute Office Visit  Subjective:     Patient ID: Benjamin Choi, male    DOB: 1990-10-02, 33 y.o.   MRN: [id number]  Chief Complaint  Patient presents with   Follow-up    Pt complains of painful canker sore on the R side of mouth. Ongoing for months.     HPI Patient is in today for tongue lesion with a history of allergic rhinitis, tongue lesion, vitiligo capitis  States that he was seen in 02/16/2023 and was started on triamcinolone  paste TID. He states that he tried it and it did not help.  States that when he would use the triamcinolone  paste it would cause burning. states that talking/singing makes it worse. States that strong tooth paist. Spicy and acidic food bother it. States that the triamcinolone  paste did not help He has tried kids tooth paste instead of his own.  States he did not like his teeth got clean He does not use tobacco products.  Denies specifically smoking vaping or dipping  States that it hurts most of the time. If he tlaks or eats the pain is worse and it will stop him in his tracts  Review of Systems  Constitutional:  Negative for chills and fever.  Respiratory:  Negative for shortness of breath.   Cardiovascular:  Negative for chest pain.  Neurological:  Negative for headaches.        Objective:    BP 134/80   Pulse 73   Temp 98.1 F (36.7 C) (Oral)   Ht 5' 8.25" (1.734 m)   Wt 166 lb 9.6 oz (75.6 kg)   SpO2 98%   BMI 25.15 kg/m    Physical Exam Vitals and nursing note reviewed.  Constitutional:      Appearance: Normal appearance.  HENT:     Mouth/Throat:   Cardiovascular:     Rate and Rhythm: Normal rate and regular rhythm.     Heart sounds: Normal heart sounds.  Pulmonary:     Effort: Pulmonary effort is normal.     Breath sounds: Normal breath sounds.  Lymphadenopathy:     Cervical: No cervical adenopathy.  Neurological:     Mental Status: He is alert.     No results found for any visits on 07/30/23.      Assessment &  Plan:   Problem List Items Addressed This Visit       Digestive   Tongue lesion - Primary   History of the same looks like where his teeth are abutting his tongue.  He has tried triamcinolone  paste without great resolve.  He has made dietary and liquid changes.  We will try Magic mouthwash if no benefit or improvement within 2 weeks patient will need to follow-up with dentist for further evaluation.      Relevant Medications   magic mouthwash (lidocaine, diphenhydrAMINE, alum & mag hydroxide) suspension    Meds ordered this encounter  Medications   magic mouthwash (lidocaine, diphenhydrAMINE, alum & mag hydroxide) suspension    Sig: Swish and spit 5 mLs 4 (four) times daily.    Dispense:  200 mL    Refill:  0    Supervising Provider:   Deri Fleet A [[id number]]    Return if symptoms worsen or fail to improve.  Margarie Shay, NP

## 2023-07-30 NOTE — Assessment & Plan Note (Signed)
 History of the same looks like where his teeth are abutting his tongue.  He has tried triamcinolone  paste without great resolve.  He has made dietary and liquid changes.  We will try Magic mouthwash if no benefit or improvement within 2 weeks patient will need to follow-up with dentist for further evaluation.

## 2023-07-30 NOTE — Patient Instructions (Signed)
 Nice to see you today I have sent in mouth wash to the pharmacy for you to swish and spit 4 times a day STOP using the triamcinolone  paste If it does not improve call the office and let me know

## 2023-07-31 ENCOUNTER — Other Ambulatory Visit: Payer: Self-pay | Admitting: Nurse Practitioner

## 2023-07-31 ENCOUNTER — Encounter: Payer: Self-pay | Admitting: Nurse Practitioner

## 2023-07-31 DIAGNOSIS — K148 Other diseases of tongue: Secondary | ICD-10-CM

## 2023-07-31 MED ORDER — LIDOCAINE VISCOUS HCL 2 % MT SOLN: 5.0000 mL | Freq: Four times a day (QID) | OROMUCOSAL | 0 refills | Status: DC

## 2023-07-31 NOTE — Telephone Encounter (Signed)
 Called and spoke with patient and sent [facility] to [facility]

## 2023-07-31 NOTE — Telephone Encounter (Signed)
 Patient called back to follow-up. I reassured him that the message was received. He asked for me to addend. Please advise.

## 2023-08-18 ENCOUNTER — Other Ambulatory Visit: Payer: Self-pay | Admitting: Nurse Practitioner

## 2023-08-18 NOTE — Telephone Encounter (Unsigned)
 Copied from CRM [id number]. Topic: Clinical - Medication Refill >> Aug 18, 2023 12:09 PM Shereese L wrote: Medication: magic mouthwash (lidocaine , diphenhydrAMINE, alum & mag hydroxide) suspension  Has the patient contacted their pharmacy? Yes (Agent: If no, request that the patient contact the pharmacy for the refill. If patient does not wish to contact the pharmacy document the reason why and proceed with request.) (Agent: If yes, when and what did the pharmacy advise?)  This is the patient's preferred pharmacy:   [facility] - [place], [place] - [address] [place] [address] Phone: [phone] Fax: [phone]  Is this the correct pharmacy for this prescription? Yes If no, delete pharmacy and type the correct one.   Has the prescription been filled recently? Yes  Is the patient out of the medication? Yes  Has the patient been seen for an appointment in the last year OR does the patient have an upcoming appointment? Yes  Can we respond through MyChart? Yes  Agent: Please be advised that Rx refills may take up to 3 business days. We ask that you follow-up with your pharmacy.

## 2023-08-19 ENCOUNTER — Telehealth: Payer: Self-pay | Admitting: Nurse Practitioner

## 2023-08-19 DIAGNOSIS — K148 Other diseases of tongue: Secondary | ICD-10-CM

## 2023-08-19 NOTE — Telephone Encounter (Signed)
 See if Magic mouthwash has healed tongue lesion if not patient is to be seen by dentistry

## 2023-08-19 NOTE — Telephone Encounter (Signed)
-----   Message from [facility] sent at 07/30/2023  1:05 PM EDT ----- Regarding: Tongue lesion See if Magic mouthwash has healed tongue lesion if not patient is to be seen by dentistry

## 2023-08-20 MED ORDER — LIDOCAINE VISCOUS HCL 2 % MT SOLN: 5.0000 mL | Freq: Four times a day (QID) | OROMUCOSAL | 0 refills | Status: DC

## 2023-08-20 NOTE — Telephone Encounter (Signed)
 Called and spoke with patient about how magic mouthwash is doing. States still not doing any better, very painful. Mouthwash helps a little. Would like more to hold over until appointment with dentist. Have appointment set on Monday.

## 2023-09-07 ENCOUNTER — Ambulatory Visit: Admitting: Nurse Practitioner

## 2023-09-07 ENCOUNTER — Telehealth: Payer: Self-pay | Admitting: Nurse Practitioner

## 2023-09-07 VITALS — BP 100/60 | HR 66 | Temp 97.8°F | Ht 68.25 in | Wt 167.6 lb

## 2023-09-07 DIAGNOSIS — K148 Other diseases of tongue: Secondary | ICD-10-CM | POA: Diagnosis not present

## 2023-09-07 MED ORDER — LIDOCAINE VISCOUS HCL 2 % MT SOLN: 5.0000 mL | Freq: Four times a day (QID) | OROMUCOSAL | 0 refills | Status: DC

## 2023-09-07 NOTE — Patient Instructions (Addendum)
 Nice to see you today  I have refilled the mouth wash Schedule a physical with me in the next 2 months

## 2023-09-07 NOTE — Assessment & Plan Note (Signed)
 Hx of the same. Has been seen by dentistry. Has had some improvement. Will renew magic mouth wash for discomfort. If no improvement follow back up with the dentist for specialty referral

## 2023-09-07 NOTE — Progress Notes (Signed)
 Acute Office Visit  Subjective:     Patient ID: Benjamin Choi, male    DOB: 03-13-1990, 33 y.o.   MRN: [id number]  Chief Complaint  Patient presents with   Follow-up    Pt complains of tongue is slightly getting better. States of still having burning sensation. Went to dentist last week and was given mouthwash. Pt states he needs refill. States mouthwash is the only thing helping with burning.    Medication Refill    Magic mouthwash to [facility].      Patient is in today for mouth lesion  He was seen by me on  07/30/2023.  At that juncture patient tried triamcinolone  paste without great resolve he had made dietary and liquid changes we did try Magic mouthwash and patient here for follow-up.  Did encourage him to seek dental care if no improvement. States that he did use the magic mouth wash and would give him relief. State that he did see the dentist and they did shave the teeth. State that it is improving but still present. States that it is hurting with any movement of the tongue  States that he had the dental work approx 1 week ago.   Review of Systems  Constitutional:  Negative for chills and fever.  Respiratory:  Negative for shortness of breath.   Cardiovascular:  Negative for chest pain.  Neurological:  Negative for headaches.  Psychiatric/Behavioral:  Negative for hallucinations and suicidal ideas.         Objective:    BP 100/60   Pulse 66   Temp 97.8 F (36.6 C) (Oral)   Ht 5' 8.25 (1.734 m)   Wt 167 lb 9.6 oz (76 kg)   SpO2 96%   BMI 25.30 kg/m    Physical Exam Vitals and nursing note reviewed.  Constitutional:      Appearance: Normal appearance.  HENT:     Right Ear: Tympanic membrane, ear canal and external ear normal.     Left Ear: Tympanic membrane, ear canal and external ear normal.     Mouth/Throat:     Mouth: Mucous membranes are moist.     Pharynx: Oropharynx is clear. Posterior oropharyngeal erythema present.    Cardiovascular:      Rate and Rhythm: Normal rate and regular rhythm.     Heart sounds: Normal heart sounds.  Pulmonary:     Effort: Pulmonary effort is normal.     Breath sounds: Normal breath sounds.  Lymphadenopathy:     Cervical: No cervical adenopathy.   Neurological:     Mental Status: He is alert.     No results found for any visits on 09/07/23.      Assessment & Plan:   Problem List Items Addressed This Visit       Digestive   Tongue lesion - Primary   Hx of the same. Has been seen by dentistry. Has had some improvement. Will renew magic mouth wash for discomfort. If no improvement follow back up with the dentist for specialty referral       Relevant Medications   magic mouthwash (lidocaine , diphenhydrAMINE, alum & mag hydroxide) suspension    Meds ordered this encounter  Medications   magic mouthwash (lidocaine , diphenhydrAMINE, alum & mag hydroxide) suspension    Sig: Swish and spit 5 mLs 4 (four) times daily.    Dispense:  200 mL    Refill:  0    Supervising Provider:   RANDEEN HARDY A [[id number]]  Return in about 2 months (around 11/07/2023) for CPE and Labs.  Adina Crandall, NP

## 2023-09-07 NOTE — Telephone Encounter (Unsigned)
 Copied from CRM [id number]. Topic: Clinical - Medication Refill >> Sep 07, 2023  8:03 AM Thersia C wrote: Medication: magic mouthwash (lidocaine , diphenhydrAMINE, alum & mag hydroxide) suspension  Has the patient contacted their pharmacy? Yes (Agent: If no, request that the patient contact the pharmacy for the refill. If patient does not wish to contact the pharmacy document the reason why and proceed with request.) (Agent: If yes, when and what did the pharmacy advise?)  This is the patient's preferred pharmacy:    [facility] - [place], [place] - [address] [place] [address] Phone: [phone] Fax: [phone]  Is this the correct pharmacy for this prescription? Yes If no, delete pharmacy and type the correct one.   Has the prescription been filled recently? No  Is the patient out of the medication? Yes  Has the patient been seen for an appointment in the last year OR does the patient have an upcoming appointment? Yes  Can we respond through MyChart? Yes  Agent: Please be advised that Rx refills may take up to 3 business days. We ask that you follow-up with your pharmacy.

## 2023-09-16 ENCOUNTER — Other Ambulatory Visit: Payer: Self-pay | Admitting: Nurse Practitioner

## 2023-09-16 DIAGNOSIS — K148 Other diseases of tongue: Secondary | ICD-10-CM

## 2023-09-16 MED ORDER — LIDOCAINE VISCOUS HCL 2 % MT SOLN: 5.0000 mL | Freq: Four times a day (QID) | OROMUCOSAL | 0 refills | Status: DC

## 2023-09-16 NOTE — Addendum Note (Signed)
 Addended by: WENDEE LYNWOOD HERO on: 09/16/2023 01:18 PM   Modules accepted: Orders

## 2023-09-16 NOTE — Telephone Encounter (Unsigned)
 Copied from CRM [id number]. Topic: Clinical - Medication Refill >> Sep 16, 2023  8:11 AM Deaijah H wrote: Medication: magic mouthwash (lidocaine , diphenhydrAMINE, alum & mag hydroxide) suspension  Has the patient contacted their pharmacy? Yes (Agent: If no, request that the patient contact the pharmacy for the refill. If patient does not wish to contact the pharmacy document the reason why and proceed with request.) (Agent: If yes, when and what did the pharmacy advise?)   This is the patient's preferred pharmacy:  [facility] - [place], [place] - [address] [place] [address] Phone: [phone] Fax: [phone]  Is this the correct pharmacy for this prescription? Yes If no, delete pharmacy and type the correct one.   Has the prescription been filled recently? Yes  Is the patient out of the medication? Yes  Has the patient been seen for an appointment in the last year OR does the patient have an upcoming appointment? Yes  Can we respond through MyChart? Yes  Agent: Please be advised that Rx refills may take up to 3 business days. We ask that you follow-up with your pharmacy.

## 2023-09-17 ENCOUNTER — Ambulatory Visit: Payer: Self-pay

## 2023-09-17 NOTE — Telephone Encounter (Signed)
 FYI Only or Action Required?: FYI only for provider.  Patient was last seen in primary care on 09/07/2023 by Wendee Lynwood HERO, NP.  Called Nurse Triage reporting Oral Swelling.  Symptoms began about a month ago.  Interventions attempted: Other: Magic mouthwash only thing that helps, nothing OTC works.  Symptoms are: unchanged.  Triage Disposition: Call PCP Now (overriding See PCP Within 2 Weeks)  Patient/caregiver understands and will follow disposition?: Yes  Advised pt that magic mouthwash was sent yesterday by PCP[facility]. [facility] and [facility]. Advised pt I would reach out to [facility] and see if they can transfer and fill for him today. Called [facility] pharmacy, spoke with [place], [facility]. She states since pt has on file can take verbal since same script. Will process and call pt when ready for PU.   Copied from CRM [id number]. Topic: Clinical - Red Word Triage >> Sep 17, 2023 11:35 AM Armenia J wrote: [facility] that prompted transfer to Nurse Triage: Patient has a laceration on his tongue and he is in severe pain. He submitted a refill request yesterday but is in a lot of pain. He is aware of the turn around time. Reason for Disposition  All other mouth symptoms  (Exceptions: Dry mouth from not drinking enough liquids, chapped lips.)  Answer Assessment - Initial Assessment Questions 1. SYMPTOM: What's the main symptom you're concerned about? (e.g., chapped lips, dry mouth, lump, sores)     Has tongue laceration  2. ONSET: When did the  sx  start?     1 month ago 3. PAIN: Is there any pain? If Yes, ask: How bad is it? (Scale: 0-10; or none, mild, moderate, severe)     Severe pain 5. OTHER SYMPTOMS: Do you have any other symptoms? (e.g., fever, sore throat, toothache, swelling)     Laceration is slightly better but underneath red and swollen and painful  Protocols used: Mouth Symptoms-A-AH

## 2023-10-03 DIAGNOSIS — S52532A Colles' fracture of left radius, initial encounter for closed fracture: Secondary | ICD-10-CM | POA: Diagnosis not present

## 2023-10-05 ENCOUNTER — Other Ambulatory Visit: Payer: Self-pay | Admitting: Nurse Practitioner

## 2023-10-05 DIAGNOSIS — K148 Other diseases of tongue: Secondary | ICD-10-CM

## 2023-10-05 NOTE — Telephone Encounter (Unsigned)
 Copied from CRM #[id number]. Topic: Clinical - Medication Refill >> Oct 05, 2023 11:50 AM Suzen RAMAN wrote: Medication: magic mouthwash (lidocaine , diphenhydrAMINE, alum & mag hydroxide) suspension  Has the patient contacted their pharmacy? Yes   This is the patient's preferred pharmacy:   [facility] - [place], [place] - [address] [place] [address] Phone: [phone] Fax: [phone]  Is this the correct pharmacy for this prescription? Yes If no, delete pharmacy and type the correct one.   Has the prescription been filled recently? No  Is the patient out of the medication? Yes  Has the patient been seen for an appointment in the last year OR does the patient have an upcoming appointment? Yes  Can we respond through MyChart? Yes  Agent: Please be advised that Rx refills may take up to 3 business days. We ask that you follow-up with your pharmacy.

## 2023-10-07 ENCOUNTER — Other Ambulatory Visit: Payer: Self-pay | Admitting: Nurse Practitioner

## 2023-10-07 ENCOUNTER — Ambulatory Visit: Payer: Self-pay

## 2023-10-07 DIAGNOSIS — K148 Other diseases of tongue: Secondary | ICD-10-CM

## 2023-10-07 MED ORDER — LIDOCAINE VISCOUS HCL 2 % MT SOLN: 5.0000 mL | Freq: Four times a day (QID) | OROMUCOSAL | 0 refills | Status: DC

## 2023-10-07 NOTE — Telephone Encounter (Signed)
 Since he has an appointment with the oral surgeon I will refill the medication

## 2023-10-07 NOTE — Telephone Encounter (Signed)
 FYI Only or Action Required?: Action required by provider: refill request originally requested on 7/28, out of med, going out of town.  Patient was last seen in primary care on 09/07/2023 by Wendee Lynwood HERO, NP.  Called Nurse Triage reporting Dental Pain and Medication Refill.  Symptoms began several months ago.  Interventions attempted: Prescription medications: magic mouthwash and Other: dentist and oral surgeon visits, upcoming oral surgeon follow up.  Symptoms are: gradually improving.  Triage Disposition: See HCP Within 4 Hours (Or PCP Triage)  Patient/caregiver understands and will follow disposition?: No, refuses disposition     Copied from CRM [id number]. Topic: Clinical - Red Word Triage >> Oct 07, 2023 11:06 AM Benjamin Choi wrote: [facility] that prompted transfer to Nurse Triage: extreme pain pt calling in about fill on mouth wash and he say thats the only thing that helps it due to the pain , its flaired up and it hurts badly Reason for Disposition  [1] SEVERE mouth pain (e.g., excruciating) AND [2] not improved after 2 hours of pain medicine  Answer Assessment - Initial Assessment Questions 1. ONSET: When did your mouth start hurting? (e.g., hours or days ago)      Months ago, dentist shaved teeth down, out of magic mouthwash for pain, going on vacation soon, open wound on tongue, hurts extremely bad, mouth wash only thing that takes some of pain away 2. SEVERITY: How bad is the pain? (Scale 1-10; or mild, moderate or severe)     10/10 stops me from sleeping and eating, not as often now, when go to bed and wake up in morning, if mouth too dry, healing slowly 3. SORES: Are there any sores or ulcers in the mouth? If Yes, ask: What part of the mouth are the sores in?     Open wound on tongue, getting better, hole is lot smaller but still open 4. FEVER: Do you have a fever? If Yes, ask: What is your temperature, how was it measured, and when did it start?     no 5. CAUSE:  What do you think is causing the mouth pain?     Been seen by dentist as well, been few weeks since dentist, went to oral surgeon said definitely healing from last inspection, keep mouthwash for discomfort, out of mouthwash 6. OTHER SYMPTOMS: Do you have any other symptoms? (e.g., difficulty breathing)     No  Have follow up with oral surgeon in next 2 weeks Picked up and used up script sent on 7/9 for magic mouthwash    Advised pt be examined in next 4 hours for persisting pain, pt declines at this time, following up with oral surgeon in next 2 weeks. Advised pt contact oral surgeon as well to see about refill on mouthwash, advised that nurse sending message to PCP office for word back about refill request originally requested on 7/28.  Protocols used: Mouth Pain-A-AH

## 2023-10-09 DIAGNOSIS — S52502A Unspecified fracture of the lower end of left radius, initial encounter for closed fracture: Secondary | ICD-10-CM | POA: Diagnosis not present

## 2023-10-26 DIAGNOSIS — S52532A Colles' fracture of left radius, initial encounter for closed fracture: Secondary | ICD-10-CM | POA: Diagnosis not present

## 2023-10-27 DIAGNOSIS — C021 Malignant neoplasm of border of tongue: Secondary | ICD-10-CM | POA: Diagnosis not present

## 2023-10-30 ENCOUNTER — Ambulatory Visit: Payer: Self-pay

## 2023-10-30 NOTE — Telephone Encounter (Signed)
 FYI Only or Action Required?: Action required by provider: request for appointment.  Patient was last seen in primary care on 09/07/2023 by Wendee Lynwood HERO, NP.  Called Nurse Triage reporting Anxiety.  Symptoms began today.  Interventions attempted: Rest, hydration, or home remedies.  Symptoms are: gradually worsening.  Triage Disposition: See PCP When Office is Open (Within 3 Days)  Patient/caregiver understands and will follow disposition?: YesCopied from CRM #[id number]. Topic: Clinical - Red Word Triage >> Oct 30, 2023  2:28 PM Franky GRADE wrote: Red Word that prompted transfer to Nurse Triage: Patient has been experiencing anxiety, mood swings and depression and has gotten recently gotten worse but did not want to disclose. Reason for Disposition  MODERATE anxiety (e.g., persistent or frequent anxiety symptoms; interferes with sleep, school, or work)  Answer Assessment - Initial Assessment Questions Anxiety Comes and goes but has gotten worse. If I'm stuck in traffic an can't move it causes me to get very upset. This seems to be my biggest trigger. 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 This has made my anxiety so much worse.      1. CONCERN: Did anything happen that prompted you to call today?      Learned of cancer in mouth 2. ANXIETY SYMPTOMS: Can you describe how you (your loved one; patient) have been feeling? (e.g., tense, restless, panicky, anxious, keyed up, overwhelmed, sense of impending doom).      worry 3. ONSET: How long have you been feeling this way? (e.g., hours, days, weeks)     On and off 4. SEVERITY: How would you rate the level of anxiety? (e.g., 0 - 10; or mild, moderate, severe).     severe 5. FUNCTIONAL IMPAIRMENT: How have these feelings affected your ability to do daily activities? Have you had more difficulty than usual doing your normal daily activities?  (e.g., getting better, same, worse; self-care, school, work, interactions)     Left work early 6. HISTORY: Have you felt this way before? Have you ever been diagnosed with an anxiety problem in the past? (e.g., generalized anxiety disorder, panic attacks, PTSD). If Yes, ask: How was this problem treated? (e.g., medicines, counseling, etc.)     Not this bad 7. RISK OF HARM - SUICIDAL IDEATION: Do you ever have thoughts of hurting or killing yourself? If Yes, ask:  Do you have these feelings now? Do you have a plan on how you would do this?     na 8. TREATMENT:  What has been done so far to treat this anxiety? (e.g., medicines, relaxation strategies). What has helped?     Breathing/relaxation  9. THERAPIST: Do you have a counselor or therapist? If Yes, ask: What is their name?     na 10. POTENTIAL TRIGGERS: Do you drink caffeinated beverages (e.g., coffee, colas, teas), and how much daily? Do you drink alcohol or use any drugs? Have you started any new medicines recently?       traffic 11. PATIENT SUPPORT: Who is with you now? Who do you live with? Do you have family or friends who you can talk to?        Wife and family 6. OTHER SYMPTOMS: Do you have any other symptoms? (e.g., feeling depressed, trouble concentrating, trouble sleeping, trouble breathing, palpitations or fast heartbeat, chest pain, sweating, nausea, or diarrhea)       Not currently  Protocols used: Anxiety and Panic Attack-A-AH

## 2023-11-02 ENCOUNTER — Ambulatory Visit: Admitting: Family Medicine

## 2023-11-02 ENCOUNTER — Other Ambulatory Visit: Payer: Self-pay

## 2023-11-02 ENCOUNTER — Encounter: Payer: Self-pay | Admitting: Family Medicine

## 2023-11-02 VITALS — BP 126/64 | HR 74 | Temp 98.1°F | Ht 68.5 in | Wt 161.4 lb

## 2023-11-02 DIAGNOSIS — K148 Other diseases of tongue: Secondary | ICD-10-CM | POA: Diagnosis not present

## 2023-11-02 DIAGNOSIS — C029 Malignant neoplasm of tongue, unspecified: Secondary | ICD-10-CM | POA: Insufficient documentation

## 2023-11-02 DIAGNOSIS — J309 Allergic rhinitis, unspecified: Secondary | ICD-10-CM

## 2023-11-02 DIAGNOSIS — F419 Anxiety disorder, unspecified: Secondary | ICD-10-CM | POA: Insufficient documentation

## 2023-11-02 MED ORDER — LIDOCAINE VISCOUS HCL 2 % MT SOLN: 5.0000 mL | Freq: Four times a day (QID) | OROMUCOSAL | 0 refills | Status: DC

## 2023-11-02 MED ORDER — HYDROXYZINE HCL 10 MG PO TABS: 10.0000 mg | ORAL_TABLET | Freq: Three times a day (TID) | ORAL | 1 refills | Status: DC | PRN

## 2023-11-02 MED ORDER — FLUTICASONE PROPIONATE 50 MCG/ACT NA SUSP: 2.0000 | Freq: Every day | NASAL | 1 refills | Status: AC

## 2023-11-02 MED ORDER — FEXOFENADINE HCL 180 MG PO TABS: 180.0000 mg | ORAL_TABLET | Freq: Every day | ORAL | 1 refills | Status: AC

## 2023-11-02 NOTE — Assessment & Plan Note (Addendum)
 Start hydroxyzine  if needed.  Routine cautions d/w pt.  D/w pt about breathing exercises, etc.  Update us  as needed.  He agrees.  Stressor/cancer dx d/w pt.  Support offered.   Could still take hydroxyzine  with allegra  but if taking hydroxyzine  frequently may not need allegra .

## 2023-11-02 NOTE — Patient Instructions (Addendum)
 Check with [facility] and Dr. Bluford.   I'll update Cable.   Start hydroxyzine  if needed.   Take care.  Glad to see you.

## 2023-11-02 NOTE — Assessment & Plan Note (Signed)
 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  I asked him to check with managing doc about HPV status.  I asked him to call about follow up at [facility], per patient already has referral in place.  Can take 2 extra strength tylenol TID with prn ibuprofen  with food, to try to limit ibuprofen  use for pain at biopsy site.  Magic mouthwash rx sent.  Stressor/dx d/w pt.  Support offered.

## 2023-11-02 NOTE — Progress Notes (Signed)
 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  He is going to call [facility] to day about follow up scans and treatment.  Still having jaw pain.    Anxiety predates cancer dx, for about 1 year.  Worse in the meantime.  No SI/HI.  No prev tx.  Can have panic, heart rate elevation.  Working at [employer] sites.  No recent etoh, occ use prior.  No tobacco.  No illicits.    Meds, vitals, and allergies reviewed.   ROS: Per HPI unless specifically indicated in ROS section   Nad Ncat MMM with healing R lateral tongue biopsy site.   Neck supple, no LA Rrr Ctab Speech and judgement intact, no tremor.

## 2023-11-10 DIAGNOSIS — C029 Malignant neoplasm of tongue, unspecified: Secondary | ICD-10-CM | POA: Diagnosis not present

## 2023-11-23 DIAGNOSIS — C760 Malignant neoplasm of head, face and neck: Secondary | ICD-10-CM | POA: Diagnosis not present

## 2023-11-23 DIAGNOSIS — C029 Malignant neoplasm of tongue, unspecified: Secondary | ICD-10-CM | POA: Diagnosis not present

## 2023-11-27 DIAGNOSIS — C148 Malignant neoplasm of overlapping sites of lip, oral cavity and pharynx: Secondary | ICD-10-CM | POA: Diagnosis not present

## 2023-12-03 DIAGNOSIS — Z9889 Other specified postprocedural states: Secondary | ICD-10-CM | POA: Diagnosis not present

## 2023-12-03 DIAGNOSIS — C021 Malignant neoplasm of border of tongue: Secondary | ICD-10-CM | POA: Diagnosis not present

## 2023-12-03 DIAGNOSIS — C029 Malignant neoplasm of tongue, unspecified: Secondary | ICD-10-CM | POA: Diagnosis not present

## 2023-12-03 DIAGNOSIS — K140 Glossitis: Secondary | ICD-10-CM | POA: Diagnosis not present

## 2023-12-03 DIAGNOSIS — Z87891 Personal history of nicotine dependence: Secondary | ICD-10-CM | POA: Diagnosis not present

## 2023-12-03 DIAGNOSIS — C023 Malignant neoplasm of anterior two-thirds of tongue, part unspecified: Secondary | ICD-10-CM | POA: Diagnosis not present

## 2023-12-03 DIAGNOSIS — Z79899 Other long term (current) drug therapy: Secondary | ICD-10-CM | POA: Diagnosis not present

## 2023-12-03 DIAGNOSIS — R079 Chest pain, unspecified: Secondary | ICD-10-CM | POA: Diagnosis not present

## 2023-12-03 DIAGNOSIS — F419 Anxiety disorder, unspecified: Secondary | ICD-10-CM | POA: Diagnosis not present

## 2023-12-03 DIAGNOSIS — C020 Malignant neoplasm of dorsal surface of tongue: Secondary | ICD-10-CM | POA: Diagnosis not present

## 2023-12-03 DIAGNOSIS — Z9109 Other allergy status, other than to drugs and biological substances: Secondary | ICD-10-CM | POA: Diagnosis not present

## 2023-12-03 DIAGNOSIS — J302 Other seasonal allergic rhinitis: Secondary | ICD-10-CM | POA: Diagnosis not present

## 2023-12-04 DIAGNOSIS — F419 Anxiety disorder, unspecified: Secondary | ICD-10-CM | POA: Diagnosis not present

## 2023-12-04 DIAGNOSIS — C029 Malignant neoplasm of tongue, unspecified: Secondary | ICD-10-CM | POA: Diagnosis not present

## 2023-12-04 DIAGNOSIS — R079 Chest pain, unspecified: Secondary | ICD-10-CM | POA: Diagnosis not present

## 2023-12-05 DIAGNOSIS — C029 Malignant neoplasm of tongue, unspecified: Secondary | ICD-10-CM | POA: Diagnosis not present

## 2023-12-05 DIAGNOSIS — F419 Anxiety disorder, unspecified: Secondary | ICD-10-CM | POA: Diagnosis not present

## 2023-12-05 NOTE — Discharge Summary (Signed)
 OTOLARYNGOLOGY - HEAD & NECK SURGERY DISCHARGE SUMMARY  Patient Demographics: Patient Name: Benjamin Choi Patient MRN: [id number] Date of Birth: 06-08-1990   Admit Date: 12/03/2023 Discharge Date: 12/05/2023  Admitting Physician: Redell Genia Pax, MD  Discharge Physician: Redell Genia Pax, MD   Primary Care Provider: Wendee Lynwood CHRISTELLA Address: [address] [place] [address]  Phone: [phone] Fax: [phone]   Admission Diagnoses:   Tongue cancer (CMS/HHS-HCC) [C02.9]  Past Medical History:  Diagnosis Date  . Allergy   . Bipolar disorder (CMS/HHS-HCC)   . Major depression in complete remission ()    Seroquel  weaned off with PCP support 2018  . Seasonal allergies   . Vitiligo capitis     Past Surgical History:  Procedure Laterality Date  . COLONOSCOPY  04/25/2021   Tubular adenoma/Repeat 40yrs/CTL  . PARTIAL REMOVAL TONGUE Right 12/03/2023   Procedure: Partial glossectomy;  Surgeon: Pax Redell Genia, MD;  Location: [facility] OR;  Service: Otolaryngology Head and Neck;  Laterality: Right;  . CERVICAL LYMPHADENECTOMY Right 12/03/2023   Procedure: Neck dissection;  Surgeon: Pax Redell Genia, MD;  Location: [facility] OR;  Service: Otolaryngology Head and Neck;  Laterality: Right;    Discharge Diagnoses:   Tongue cancer (CMS/HHS-HCC) [C02.9]  Past Medical History:  Diagnosis Date  . Allergy   . Bipolar disorder (CMS/HHS-HCC)   . Major depression in complete remission ()    Seroquel  weaned off with PCP support 2018  . Seasonal allergies   . Vitiligo capitis     Past Surgical History:  Procedure Laterality Date  . COLONOSCOPY  04/25/2021   Tubular adenoma/Repeat 11yrs/CTL  . PARTIAL REMOVAL TONGUE Right 12/03/2023   Procedure: Partial glossectomy;  Surgeon: Pax Redell Genia, MD;  Location: [facility] OR;  Service: Otolaryngology Head and Neck;  Laterality: Right;  . CERVICAL LYMPHADENECTOMY Right 12/03/2023   Procedure: Neck dissection;  Surgeon:  Pax Redell Genia, MD;  Location: [facility] OR;  Service: Otolaryngology Head and Neck;  Laterality: Right;    Consult Orders: IP CONSULT TO GEN MED (HOSPITALIST)   Surgeries Performed: Procedure(s): Partial glossectomy Neck dissection  Brief History of Present Illness: Mr. Tippin is a 33yM with cT1 cN0 M0 SCC of the right oral tongue now s/p partial glossectomy and right level Ib - IV neck dissection on 12/03/23  Hospital Course: On the day of surgery, the patient presented preoperatively and was correctly identified and marked. NPO status and consent were verified. The patient was taken to the operating roomand underwent procedures as listed above with Dr. Pax. Patient tolerated the procedures well without concern. The details of the procedure can be found in an operative note dictated on the day of procedure. The patient was stabilized post-operatively in the PACU and was admitted to the [facility] service in stable condition for postoperative care, including serial physical exams, pain control, and advancement of diet.  A summarized list of pertinent hospital events is detailed below: 12/03/23: OR for partial glossectomy, neck dissection 12/04/23: Recovering well postoperatively, tolerating liquid diet, improving pain control 12/05/23: Continued improvements in pain control and PO intake, discharged to home with drain  Discharge Medications:   Current Discharge Medication List     START taking these medications      Instructions  acetaminophen 160 mg/5 mL suspension Quantity: 473 mL Refills: 0  Commonly known as: TYLENOL Take 20.3 mLs (649.6 mg total) by mouth every 6 (six) hours Take every 6 hours until your pain is well controlled and you are  no longer needing the other pain medications, then take only if needed Last time this was given: 650 mg on December 05, 2023  6:27 AM   amoxicillin-clavulanate 875-125 mg tablet Quantity: 10 tablet Refills: 0 Stop taking on: December 10, 2023  Commonly known as: AUGMENTIN Take 1 tablet (875 mg total) by mouth 2 (two) times daily for 5 days   bacitracin ointment Quantity: 14 g Refills: 0 Stop taking on: December 15, 2023  Apply topically 2 (two) times daily for 10 days To the right neck incision Last time this was given: 1 Application on December 03, 2023  2:18 PM   celecoxib 100 MG capsule Quantity: 20 capsule Refills: 0 Stop taking on: December 15, 2023  Commonly known as: CeleBREX Take 1 capsule (100 mg total) by mouth 2 (two) times daily for 10 days Last time this was given: 100 mg on December 05, 2023  6:27 AM   chlorhexidine 0.12 % solution Quantity: 473 mL Refills: 0 Stop taking on: December 19, 2023  Commonly known as: PERIDEX Swish and spit 5 mLs 2 (two) times daily for 14 days Last time this was given: 5 mLs on December 05, 2023  8:40 AM   hydrOXYzine  10 mg/5 mL syrup Quantity: 50 mL Refills: 0 Stop taking on: December 15, 2023 Replaces: hydrOXYzine  HCL 10 MG tablet  Commonly known as: ATARAX  Take 5 mLs (10 mg total) by mouth 2 (two) times daily as needed for Anxiety for up to 10 days THIS IS A SEDATING MEDICATION THAT CANNOT BE TAKEN WITHIN 8 HOURS BEFORE OR AFTER TAKING OXYCODONE. SEE DISCHARGE INSTRUCTIONS FOR ADDITIONAL DETAILS Last time this was given: 10 mg on December 05, 2023  6:27 AM   oxyCODONE 5 mg/5 mL solution Quantity: 140 mL Refills: 0 Stop taking on: December 12, 2023 Replaces: oxyCODONE 5 MG immediate release tablet  Commonly known as: ROXICODONE Take 5 mLs (5 mg total) by mouth every 6 (six) hours as needed for Pain for up to 7 days THIS IS A SEDATING MEDICATION THAT CANNOT BE TAKEN WITHIN 8 HOURS BEFORE OR AFTER TAKING HYDROXYZINE . SEE DISCHARGE INSTRUCTIONS FOR ADDITIONAL DETAILS Last time this was given: 5 mg on December 05, 2023  1:05 PM   sennosides 8.8 mg/5 mL syrup Quantity: 237 mL Refills: 0 Stop taking on: December 15, 2023  Commonly known as: sennosides Take 5 mLs by  mouth once daily for 10 days This is for constipation. Stop if diarrhea or frequent bowel movements       CONTINUE taking these medications      Instructions  fluticasone  propionate 50 mcg/actuation nasal spray Refills: 0  Commonly known as: FLONASE  Place 2 sprays into both nostrils once daily Last time this was given: 2 sprays on December 05, 2023  8:41 AM   polyethylene glycol powder Quantity: 238 g Refills: 0  Commonly known as: MIRALAX Take 17 g by mouth once daily Mix in 4-8ounces of fluid prior to taking.       STOP taking these medications    fexofenadine  180 MG tablet Commonly known as: ALLEGRA    hydrOXYzine  HCL 10 MG tablet Commonly known as: ATARAX  Replaced by: hydrOXYzine  10 mg/5 mL syrup   melatonin 5 mg Tab   oxyCODONE 5 MG immediate release tablet Commonly known as: ROXICODONE Replaced by: oxyCODONE 5 mg/5 mL solution        Drug Sensitivities/Allergies:  Patient has no known allergies.  Discharge Disposition: Home  Physical Examination at Discharge:  BP 124/69 (  BP Location: Right upper arm, Patient Position: Lying)   Pulse 57   Temp 36.5 C (97.7 F) (Axillary)   Resp 18   Ht 175.3 cm (5' 9)   Wt 70.5 kg (155 lb 6.8 oz)   SpO2 98%   BMI 22.95 kg/m   Alert but feels sleepy, interacting appropriately, no distress Right oral tongue s/p partial glossectomy, has absorbable sutures, some restriction of movement Cannot elevate right arm fully to ear, difficult to tell at this time if this is related to right CN XI or pain Expected right auricle and neck numbness Right neck incision is soft and flat, closed with fast absorbing gut suture, no hematoma or seroma, healing well Right neck JP drain in place with serous output  Laboratory Data:  Recent Labs    12/03/23 1537 12/04/23 1015  WBC 10.4* 11.7*  HGB 15.2 14.2  HCT 43.0 41.0  PLT 221 245  NA 137 141  K 4.2 3.5  BUN 11 9  CREATININE 1.1 1.0     Inpatient  Management:  #Partial Glossectomy #Right SND - Liquid diet, encourage PO intake, advance as tolerated - Unasyn while inpatient, Augmentin outpatient for 5 days - Peridex BID for 10 days - Bacitracin BID for 10 days - Will keep drain til f/u on Monday (to be formally added to the schedule Monday morning) - Will follow up pathology and discuss with patient at outpatient appointment   #Pain control   #Anxiety - Scheduled acetaminophen 650 mg q6h until not needing oxycodone - will discharge with liquid tylenol - Celebrex 100 mg BID - no other NSAIDS - Oxycodone 5 mg q6h prn - Takes hydroxyzine  at home for anxiety and has had 10 mg roughly BID while here - will send liquid version at discharge with strict instructions to not take within 8 hours before or after oxycodone, should only take either as needed for pain (oxycodone) or anxiety (hydroxyzine )  - Will discontinue gabapentin, melatonin, allergy meds due to sedation from combination of multiple sedating medications - Will discontinue magic mouthwash at discharge to limit polypharmacy and prolonged use of topical intraoral steroids   Patient Instructions:  OTOLARYNGOLOGY - HEAD & NECK SURGERY  DISCHARGE INSTRUCTIONS  Admission diagnosis: Tongue cancer (CMS/HHS-HCC) [C02.9] Your ATTENDING Physician was:  Dr. Otho, Redell Curry, MD  Operative procedure: excision of tongue lesion and removal of right neck lymph nodes  - Recommended diet: liquids and soft foods, advance to regular diet as tolerated - Recommended activity: no lifting or strenuous exercise for 2 weeks, no driving while on opioid or other sedating medications  If you have any questions or concerns during clinic hours (M-F 8-5), please contact [facility] & Neck Oncology at [phone].  After-hours, please call [phone] and ask to speak with the ENT resident on call.  Healing will take some time. You may have numbness around the surgical site. You will  have tenderness at the surgical site and there may be some swelling and bruising. You may have some nausea. Please ask questions if there is anything you do not understand.   HOME CARE INSTRUCTIONS:   Pain and Anxiety Medications  - For pain, take acetaminophen (Tylenol) 650 mg every 6 hours until your pain is at a point where you are no longer needing the other pain medications, then take only if needed. We have prescribed a liquid version of tylenol, but you can also get this over the counter and its ok  to go back to the pill version of tylenol instead of liquids once you feel like you can swallow them. Do not combine sources of acetaminophen - it is also found in some prescription pain medications such a Norco and in some over the counter pain and cold medicines. Never exceed 4g of acetaminophen in any 24 hour period as this can cause life threatening liver injury.  - Take celebrex twice a day for 10 days. This medication does not come as a liquid. This is a nonsteroidal anti-inflammatory medication (NSAID), so is the same category of pain medications as motrin , advil , aspirin, ibuprofen , and other NSAIDS - it is very important that you do not take any of these other pain medications while you are taking the celebrex.   - For pain not controlled by Tylenol and celebrex, ok to take oxycodone up to every 6 hours if needed for severe pain. Do not drink alcohol, drive, or make important decisions if taking oxycodone. Do not take any other sedating medications (medications that cause sleepiness or drowsiness) on days that you take oxycodone or this can cause dangerous oversedation. Examples of other sedating medications include some anxiety medications (including benzodiazepines such as Xanax and Valium), sleep medicines (melatonin, trazodone, ambien, anything that makes you sleepy), some cold medications (cough suppressants, nighttime cold medications), and other pain medications (including other opioid  pain medications and gabapentin). This is not a complete list of all sedating medications, ask if you are unsure if your medications are safe to take with oxycodone.   - The hydroxyzine  (Atarax ) is the medication that you have been on for anxiety. The main issue with atarax  is that it is also a sedating medication that can be dangerous in combination with oxycodone (can cause you to be overly sedated). We are going to send you the liquid version of the hydroxyzine  so that you have some doses available, but you cannot take it within 8 hours before or after taking oxycodone to avoid sedation because it has a long half life (it stays in your body for a long time after you take it). If you are using liquid hydroxyzine , do not also use the hydroxyzine  pills that you already had at home, pick one or the other. If you are noticing that you are feeling sleepier or groggier than normal, do not take the hydroxyzine  or oxycodone and let us  know. Once you are through the recovery period, work with your primary care provider to decide if you still need the hydroxyzine .  - Since you are on multiple medications that can make you sleepy and sedated, do not take melatonin at night until you are no longer needing the oxycodone. Allegra  and other allergy medications can also be sedating, so also avoid taking these until no longer needing oxycodone Other Medications - Take Augmentin antibiotic twice a day for 5 days as prescribed - Use the bacitracin antibiotic ointment to the right neck incision twice a day for 10 days, then stop using it and use aquaphor or vaseline instead - Use Peridex mouthwash 2-3 times a day for at least 10 days - Both pain medication and surgery will make you constipated. Go ahead and take the miralax and sennosides daily until you have a bowel movement. These are both laxatives (stool softeners). Once you have a bowel movement, you can stop sennosides and just take miralax. Keep taking this for about  10 days or until you are having regular bowel movements. If you are having diarrhea or very  frequent bowel movements, stop both the miralax and the sennosides. Call the office if you have not had a bowel movement in the next 3 days Wound care  - All of the sutures are absorbable and do not need to be removed. Please clean the right neck incision twice daily with a mix of 1/2 hydrogen peroxide and 1/2 water to remove any crusting or debris from the incision or sutures.  After cleaning, place a thin layer of the bacitracin antibiotic ointment over your incision. Do this for 10 days, then stop the antibiotic ointment and switch to aquaphor or vaseline  - You may shower, but do not immerse any of your wounds in water.   - The right neck drain will stay in place until your follow up appointment with the nurse on Monday September 29. We want you to keep track of how much output you are getting every day by filling out this drain log and bringing it back with you at your follow up. See drain instructions below Postoperative Drain Care Instructions  Patients having surgery may be sent home from the hospital with one or more drains. The type of drain most commonly used is called a Leonce Birk (or JP) drain. This drain consists of a small plastic reservoir bulb connected to a flexible drainage tube. Its purpose is to remove fluid from the surgical wound through mild suction. The JP drain stays in place for an average of 7-14 days (possibly longer), and can be removed in your surgeons' office.  Carefully review these instructions on how to care for your JP drain at home. You should also ask your hospital nurse to review the steps of JP drain care prior to discharge from the hospital.  The JP drain tubing is sutured into place. Pulling on it may cause discomfort. Your nurse will show you how to secure the JP drain so it will not pull and cause discomfort. Check the skin around the insertion site of the JP drain  (and surgical incision) looking for signs of infection daily. Slight redness around the JP drain insertion site is not unusual. However, a large area of redness or tenderness around the JP drain insertion site may indicate a problem. JP drain care involves having a clean area to 'empty and measure' the JP drain; and washing your hands beforehand after drain care. JP drain care involves three important steps: Keeping the JP drain tubing from 'clogging'. Before or after the JP drain reservoir bulb is emptied and measured.'milk or strip' the JP drain tubing. This is done to prevent small clots from blocking the tubing. To 'milk or strip' the JP drain tubing, the motion is similar to curling ribbon for a package. Note: do not unplug the JP drain bulb from its' suction position when 'milking or stripping' the JP drain tubing. To 'milk or strip' the JP tubing, use one hand to hold the JP drain tubing as close to the insertion point in the wound as possible to secure the tubing. Using the other hand, squeeze the JP drain tubing using your thumb and forefinger. Applying firm pressure, squeeze the JP drain tubing from the part of the tubing close to your wound to the bulb.   Measuring JP drain output. Hold JP drain reservoir bulb upright. Remove the drainage plug to release suction. Turn the reservoir bulb upside down and gently squeeze the fluid into a measuring cup (which the hospital will provide upon discharge after surgery). Record the amount of drainage. To  resume JP drain suction, squeeze the reservoir bulb from 'side to side' to remove the air, and while squeezing the bulb, reinsert the drainage plug.  Maintaining accurate JP drain output record. Measure JP drain output twice a day, in the morning and in the evening. Record the time, the amount of drainage, and color of drainage.   Drain Output Record  (please bring to follow up appointment) Call clinic when drain total is <30 mL in 24 hours       Date AM  Drain Output (mL) PM Drain Output (mL) Daily drain Total (mL)                                                                                                                                                                                                           Additional Drain Care Tips Do not disconnect the reservoir bulb from the tubing to remove clots or rinse the inside of the bulb with water because this could increase likelihood of infection. Dispose of the JP drainage (once it is measured) either down the sink or toilet. Try to wear clothes that are easy to get 'on and off' while the JP drain is in place (preferably button down or zipper front shirts). The JP drain usually is removed when the drainage fluid is 30cc or less for 24 hours. (Note: this parameter may vary depending on your surgeon, and if so, it will be clarified at the preoperative teaching session with your nurse as to when the range of motion exercises may begin after surgery). SEEK MEDICAL CARE IF: You have increased bleeding (more than a small spot) from the surgical site. You have redness, swelling, or increasing pain in the wound. You see pus coming from the wound. You have a fever greater than 101.5 F or 38.5 C. You notice a bad smell coming from the wound or dressing. You feel lightheaded or faint. You develop a rash. SEEK IMMEDIATE MEDICAL CARE IF:  You have chest pain. You have trouble breathing You develop allergies.  You are unable to drink or tolerate medications.  OTOLARYNGOLOGY - HEAD & NECK SURGERY CONTACT INFORMATION  The clinic will call you Monday morning to schedule the drain removal appointment. Call us  if you have not heard anything by 10 am Monday   If you have any questions or concerns for the ENT (Ear Nose Throat) team during clinic hours please contact the Otolaryngology - Head and Neck Surgery [facility] at [phone]) [phone]. After-hours, please contact  the ENT Resident On Call at [phone]  [phone]. If you need to schedule or reschedule an appointment, please call the main [facility] Appointment Scheduling line at [phone].  Future Appointments  Date Time Provider Department Center  12/11/2023 11:00 AM Hughley, Redell Curry, MD [facility]       Activity: As tolerated  Results Pending at Discharge: Surgical pathology  Follow-up Tests Recommended: None  Follow-up Appointments Future Appointments  Date Time Provider Department Center  12/11/2023 11:00 AM Hughley, Redell Curry, MD [facility]    Time spent: >30 minutes  CHIQUITA VELIA LUNGER, MD 12/05/2023  Hospital Contact Information:  Madie Persons [facility])  [facility])  [facility])   Pending tests:  Laboratory: [phone] Microbiology: [phone] Pathology: [phone] Radiology: [phone]  General questions: [phone]  Pending tests: Laboratory: [phone] Microbiology: [phone] Pathology: [phone] Radiology: [phone]  General questions:  [phone]  Pending tests:  Laboratory: [phone] Microbiology: [phone] Pathology: [phone] Radiology: [phone]  General questions:  [phone]          \

## 2023-12-07 DIAGNOSIS — Z48814 Encounter for surgical aftercare following surgery on the teeth or oral cavity: Secondary | ICD-10-CM | POA: Diagnosis not present

## 2023-12-11 DIAGNOSIS — C148 Malignant neoplasm of overlapping sites of lip, oral cavity and pharynx: Secondary | ICD-10-CM | POA: Diagnosis not present

## 2023-12-11 NOTE — Progress Notes (Signed)
 Otolaryngology - Head & Neck Surgery - Consult Note    Referring Physician: Otho Redell Curry, *  CHIEF COMPLAINT:  Post Operative Visit   Clinical Stage: cT1 N0 M0 Pathological Stage (if applicable): pT2 N0 M0 (positive specimen margins) Treatment (if applicable): partial gloss/neck dissection  HPI:   The history was collected from: the patient and a review of medical records.     Benjamin Choi is a 33 y.o. 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 Now, s/p right partial glossectomy and I-IV neck dissection on 12/03/2023.  Initial history: Patient initially saw PCP 02/2023 for a painful sore on tongue. Has since tried triamcinolone  paste, magic mouthwash. His dentist shaved a sharp molar down to help with the irritation against the tongue. Patient then underwent a biopsy of right lateral tongue lesion 8/19 with Dr. Bluford at [facility] Oral and Facial surgery: invasive SCC. [facility] path slide consult still in process.  Here today with wife, Benjamin Choi. Patient reports constant burning/sore pain to right tongue, right ear, right jaw and right side of his throat. He is taking ibuprofen  for pain which he feels this mostly controls the pain. He has tried tramadol which he states hurt his stomach and did not provide relief. He was eating softs due to pain when eating, but now is able to eat chicken/steak. Sometimes, water causes irritation to tongue. Reports about 5lbs of weight loss.  He has not had any imaging. Patient sings for hobby and recently has been painful to sing/speak/chew. 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 He has two children, 7yo and 4yo.   Former off/on smoker for ~2 years in his teens. Social alcohol. Denies personal history of cancer including skin cancer. Denies dysphagia, fever, chills, night sweats. Denies family history of head and neck cancer and lymphoma. No prior head  and neck surgeries including tonsillectomy.   DISEASE HISTORY/WORK-UP/TREATMENT:   Past Medical History:  Diagnosis Date  . Allergy   . Bipolar disorder (CMS/HHS-HCC)   . Major depression in complete remission ()    Seroquel  weaned off with PCP support 2018  . Seasonal allergies   . Vitiligo capitis    Past Surgical History:  Procedure Laterality Date  . COLONOSCOPY  04/25/2021   Tubular adenoma/Repeat 86yrs/CTL  . PARTIAL REMOVAL TONGUE Right 12/03/2023   Procedure: Partial glossectomy;  Surgeon: Otho Redell Curry, MD;  Location: [facility] OR;  Service: Otolaryngology Head and Neck;  Laterality: Right;  . CERVICAL LYMPHADENECTOMY Right 12/03/2023   Procedure: Neck dissection;  Surgeon: Otho Redell Curry, MD;  Location: [facility] OR;  Service: Otolaryngology Head and Neck;  Laterality: Right;   Current Outpatient Medications on File Prior to Visit  Medication Sig Dispense Refill  . acetaminophen (TYLENOL) 160 mg/5 mL suspension Take 20.3 mLs (649.6 mg total) by mouth every 6 (six) hours Take every 6 hours until your pain is well controlled and you are no longer needing the other pain medications, then take only if needed 473 mL 0  . chlorhexidine (PERIDEX) 0.12 % solution Swish and spit 5 mLs 2 (two) times daily for 14 days 473 mL 0  . fluticasone  propionate (FLONASE ) 50 mcg/actuation nasal spray Place 2 sprays into both nostrils once daily    . melatonin 10 mg Cap Take 10 mg by mouth    . polyethylene glycol (MIRALAX) powder Take 17 g by mouth once daily Mix in 4-8ounces of  fluid prior to taking. (Patient not taking: Reported on 12/11/2023) 238 g 0   No current facility-administered medications on file prior to visit.   Social History   Socioeconomic History  . Marital status: Married  . Number of children: 2  Occupational History  . 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 Smoking status: Never    Passive exposure: Never  . Smokeless tobacco: Never   Vaping Use  . Vaping status: Never Used  Substance and Sexual Activity  . Alcohol use: Yes    Comment: social  . Drug use: Never  . Sexual activity: Yes   Social Drivers of Corporate investment banker Strain: Low Risk  (12/03/2023)   Overall Financial Resource Strain (CARDIA)   . Difficulty of Paying Living Expenses: Not hard at all  Food Insecurity: No Food Insecurity (12/03/2023)   Hunger Vital Sign   . Worried About Programme researcher, broadcasting/film/video in the Last Year: Never true   . Ran Out of Food in the Last Year: Never true  Transportation Needs: No Transportation Needs (12/04/2023)   PRAPARE - Transportation   . Lack of Transportation (Medical): No   . Lack of Transportation (Non-Medical): No  Housing Stability: Low Risk  (12/03/2023)   Housing Stability Vital Sign   . Unable to Pay for Housing in the Last Year: No   . Number of Times Moved in the Last Year: 0   . Homeless in the Last Year: No   Family History  Problem Relation Age of Onset  . No Known Problems Mother   . Heart disease Father   . Reflux disease Father   . Skin cancer Paternal Grandmother   . Colon cancer Neg Hx     Review of Systems A comprehensive review of systems has been done.  All pertinent positives and negative have been documented in the HPI, all other systems negative.      Objective:  BP 114/82 (BP Location: Right upper arm, Patient Position: Sitting, BP Cuff Size: Adult)   Pulse 79   Temp 36.8 C (98.2 F) (Tympanic)   Resp 20   Ht 175.3 cm (5' 9)   Wt 69.8 kg (153 lb 14.1 oz)   BMI 22.72 kg/m  Wt Readings from Last 5 Encounters:  12/11/23 69.8 kg (153 lb 14.1 oz)  12/07/23 69.5 kg (153 lb 3.5 oz)  12/03/23 70.5 kg (155 lb 6.8 oz)  11/27/23 72.5 kg (159 lb 13.3 oz)  11/20/23 74.8 kg (165 lb)    CONSTITUTIONAL: The patient was seen and examined in a quiet exam room.  The patient could communicate in English without difficulty.  GENERAL: Well developed and well nourished; NAD  PSYCH: Mood  and affect appropriate  RESPIRATORY: breathing non-labored without stridor or stertor  NEURO:  the patient was awake, alert, and oriented to person, place, and time. Recent and remote memory is intact Cranial Nerves: II - XII were grossly intact.    MUSCULOSKELETAL: normal gross motor movement and gait.CN XI bilateral and symmetric  SKIN:  Normal coloration and turgor. No rashes or suspicious skin lesions noted.  EYES: PERRL, EOM intact  EAR/NOSE/THROAT:    Oral Cavity: right tongue foreshortened, right suture line intact. Soft, no swelling or hematoma.   Oropharynx: Soft palate elevates symmetrically. No visible or palpable lesions in the base of tongue or tonsils bilaterally.  +1 tonsils.  No parapharyngeal space fullness.    LYMPH/IMMUNE:   Neck: Right neck  incision healing well, no seroma,hematoma, infection.   Larynx: midline without pain to manipulation  Thyroid : Thyroid  is normal in size without nodules or tenderness    Salivary Glands: No mass or lesions noted.   I have personally reviewed the following tests and reported results:  CT Scan with contrast: 11/23/2023--no obvious evidence of pathologic adenopathy.       Assessment & Plan:   ISAAH FURRY is a 33 y/o male with newly diagnosed pT2 N0 M0 SCCa right oral tongue s/p right partial glossectomy and neck dissection.  Path showed microscopically positive margins. Will discuss at tumor board recommendation for chemoradiation vs re-resection followed by adjuvant treatment.    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 Wife, Andrez Macadam, best contact: [phone]    Attestation Statement:   I personally performed the service. (TP)  BRIAN GENIA PAX, MD

## 2023-12-15 ENCOUNTER — Encounter (HOSPITAL_COMMUNITY): Payer: Self-pay

## 2023-12-15 ENCOUNTER — Other Ambulatory Visit: Payer: Self-pay

## 2023-12-15 ENCOUNTER — Emergency Department (HOSPITAL_COMMUNITY)
Admission: EM | Admit: 2023-12-15 | Discharge: 2023-12-15 | Disposition: A | Discharge status: Home / Self Care | Attending: Emergency Medicine | Admitting: Emergency Medicine

## 2023-12-15 DIAGNOSIS — Z5189 Encounter for other specified aftercare: Secondary | ICD-10-CM

## 2023-12-15 DIAGNOSIS — Z85810 Personal history of malignant neoplasm of tongue: Secondary | ICD-10-CM | POA: Diagnosis not present

## 2023-12-15 DIAGNOSIS — Z4800 Encounter for change or removal of nonsurgical wound dressing: Secondary | ICD-10-CM | POA: Insufficient documentation

## 2023-12-15 DIAGNOSIS — Z4801 Encounter for change or removal of surgical wound dressing: Secondary | ICD-10-CM | POA: Diagnosis not present

## 2023-12-15 NOTE — ED Triage Notes (Signed)
 Pt is coming in for a post-op issue, he had a tumor excised from his mouth/tongue around 2 weeks ago at [facility]. Tonight he woke up from his sleep with blood filling his mouth, bleeding is controlled at this time in triage. He mentions seeing a hole where the tumor once was and where these was also dissolvable sutures. They called the [facility] surgeon who recommended they come here to make sure there was not a hematoma or other emergent concerns.

## 2023-12-15 NOTE — Discharge Instructions (Signed)
 Please follow-up with your surgeon today.  I don't see any hematoma or active bleeding.  If this changes, please return.

## 2023-12-15 NOTE — Progress Notes (Signed)
   Record of Discussion   Patient Name: Benjamin Choi  Diagnosis and Stage: pT2N0M0 SCC Right lateral oral tongue  Plan of Care: s/p right partial glossectomy and SND 1-4 on 12/03/23, focal positive margin from resection, negative nodal mets. Tumor board recommended against taking patient back to surgery for re-resection. Recommend Radiation to the primary site and neck due to aggressive features, positive margin.   Orders and Appointments Needed (if applicable): Patient lives in [place], would prefer [facility] for adjuvant therapy. Will make referral for Radiation Oncology at this preferred site. Discussed plan with patient and his wife on the phone.    Please note that this is a multi-disciplinary tumor board attended by Head and Neck Surgical Oncology, Medical Oncology, Radiation Oncology, Radiology, Pathology, Dental, Speech & Language Pathology, Nutrition/Dietician, Palliative Care, and all other specialty providers that aid in providing care to our head and neck cancer population.  The diagnosis, stage, and plan of care listed above was decided upon in a collaborative decision making effort after carefully reviewing the patient's history, exam, pathology, and radiology imaging.                                                                            Lauraine Cramp, MD, PhD Otolaryngology PGY-5 Department of Head and Neck Surgery & Communication Sciences ENT Consult Service Pager: [phone] Personal Pager: [phone] Pediatrics Pager: [phone]

## 2023-12-15 NOTE — ED Provider Notes (Signed)
 [facility]-EMERGENCY DEPT [facility] Emergency Department Provider Note MRN:  [id number]  Arrival date & time: 12/15/23     Chief Complaint   Post-op Problem   History of Present Illness   Benjamin Choi is a 33 y.o. year-old male presents to the ED with chief complaint of bleeding.  Had recent glossectomy by ENT due to a lingual cancer.  He states that he woke with blood in his mouth tonight.  States that the bleeding stopped after rinsing his mouth out several times.  States that he called the ENT office and was advised to come to the ER to rule out hematoma and active bleeding.  History provided by patient.   Review of Systems  Pertinent positive and negative review of systems noted in HPI.    Physical Exam   Vitals:   12/15/23 0326  BP: 130/85  Pulse: 80  Resp: 17  Temp: 97.9 F (36.6 C)  SpO2: 100%    CONSTITUTIONAL:  non toxic-appearing, NAD NEURO:  Alert and oriented x 3, CN 3-12 grossly intact EYES:  eyes equal and reactive ENT/NECK:  Supple, no stridor, partial glossectomy, no active bleeding, no hematoma CARDIO:  normal rate, regular rhythm, appears well-perfused  PULM:  No respiratory distress,  GI/GU:  non-distended,  MSK/SPINE:  No gross deformities, no edema, moves all extremities  SKIN:  no rash, atraumatic   *Additional and/or pertinent findings included in MDM below  Diagnostic and Interventional Summary    EKG Interpretation Date/Time:    Ventricular Rate:    PR Interval:    QRS Duration:    QT Interval:    QTC Calculation:   R Axis:      Text Interpretation:         Labs Reviewed - No data to display  No orders to display    Medications - No data to display   Procedures  /  Critical Care Procedures  ED Course and Medical Decision Making  I have reviewed the triage vital signs, the nursing notes, and pertinent available records from the EMR.  Social Determinants Affecting Complexity of Care: Patient has no clinically significant  social determinants affecting this chief complaint..   ED Course:    Medical Decision Making Patient 10 days s/p partial glossectomy 2/2 oral tumor.  No bleeding on my exam.  No hematoma.  Offered labs and observation for a couple of hours.  He states that he feels comfortable going home.  He states that he can follow-up with the ENT today.         Consultants: No consultations were needed in caring for this patient.   Treatment and Plan: Emergency department workup does not suggest an emergent condition requiring admission or immediate intervention beyond  what has been performed at this time. The patient is safe for discharge and has  been instructed to return immediately for worsening symptoms, change in  symptoms or any other concerns    Final Clinical Impressions(s) / ED Diagnoses     ICD-10-CM   1. Visit for wound check  Z51.89       ED Discharge Orders     None         Discharge Instructions Discussed with and Provided to Patient:    Discharge Instructions      Please follow-up with your surgeon today.  I don't see any hematoma or active bleeding.  If this changes, please return.      Vicky Charleston, PA-C 12/15/23 0400  Theadore Ozell HERO, MD 12/15/23 [phone]

## 2023-12-24 ENCOUNTER — Telehealth: Payer: Self-pay | Admitting: Radiation Oncology

## 2023-12-24 ENCOUNTER — Other Ambulatory Visit: Payer: Self-pay | Admitting: Radiation Oncology

## 2023-12-24 ENCOUNTER — Inpatient Hospital Stay
Admission: RE | Admit: 2023-12-24 | Discharge: 2023-12-24 | Disposition: A | Discharge status: Home / Self Care | Payer: Self-pay | Source: Ambulatory Visit | Attending: Radiation Oncology | Admitting: Radiation Oncology

## 2023-12-24 DIAGNOSIS — C029 Malignant neoplasm of tongue, unspecified: Secondary | ICD-10-CM

## 2023-12-24 NOTE — Telephone Encounter (Signed)
 10/16 follow up call to [place] spoke to [place].  Waiting on images

## 2023-12-24 NOTE — Telephone Encounter (Signed)
 10/16 Sent via stat fax request for recent ct images to be pushed to powershare from [facility].  Waiting on images.

## 2023-12-25 ENCOUNTER — Telehealth: Payer: Self-pay | Admitting: Radiation Oncology

## 2023-12-25 NOTE — Progress Notes (Signed)
 Head and Neck Cancer Location of Tumor / Histology:  Primary Tongue Squamous Cell Carcinoma   Patient presented 12 months ago with symptoms of:  Painful sore on tongue and reports constant burning and sore pain to the right tongue, right ear, right jaw and right side of his throat.   Biopsies revealed: ***  Nutrition Status Yes No Comments  Weight changes? []  []    Swallowing concerns? []  []    PEG? []  []     Referrals Yes No Comments  Social Work? []  []    Dentistry? []  []    Swallowing therapy? []  []    Nutrition? []  []    Med/Onc? []  []     Safety Issues Yes No Comments  Prior radiation? []  []    Pacemaker/ICD? []  []    Possible current pregnancy? []  []    Is the patient on methotrexate? []  []     Tobacco/Marijuana/Snuff/ETOH use: Former smoker. Social drinker   Past/Anticipated interventions by otolaryngology, if any:   Past/Anticipated interventions by medical oncology, if any: {:[id number]}     Current Complaints / other details:  ***

## 2023-12-25 NOTE — Telephone Encounter (Addendum)
 10/17 Follow up call to [facility] for images to be push to powershare in [place], apparently [facility] is doing something different concerning sending images in/through powershare -per [place] ([facility]).  Requested images to be pushed to both [facility] and [place]).  F/U call to [facility]/still not seeing images to upload from [facility].

## 2023-12-31 ENCOUNTER — Encounter: Payer: Self-pay | Admitting: Radiation Oncology

## 2023-12-31 NOTE — Progress Notes (Signed)
 Oncology Nurse Navigator Documentation   I called [name] to introduce myself as the nurse navigator who will be working with him. I was able to leave a VM with my contact information and clarifying his appointment starting at 8:30 tomorrow. I asked him to return my call if he had any questions, otherwise I will meet him tomorrow.   Benjamin Jefferson RN, BSN, OCN Head & Neck Oncology Nurse Navigator [facility] Phone # [phone]  Fax # [phone]

## 2023-12-31 NOTE — Progress Notes (Addendum)
 Radiation Oncology         [phone] ________________________________  Initial Outpatient Consultation  Name: Benjamin Choi MRN: [id number]  Date: 01/01/2024  DOB: December 15, 1990  RR:Rjaoz, Lynwood CHRISTELLA, NP  Otho Redell Curry, *   REFERRING PHYSICIAN: Otho Redell Curry, *  DIAGNOSIS: C02.3    ICD-10-CM   1. 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 male who initially presented to his PCP in December of 2024 with a painful sore on his right lateral tongue. He was given triamcinolone  paste at that time to manage this without relief achieved.  He returned to his PCP in May of 2025 with c/o persistent pain from the lesion and was advised to try magic mouthwash which again did not provide him any relief. He was subsequently advised to follow-up with his dentist who shaved down a sharp molar to help prevent further irritation against his tongue.  Given persistence of the painful tongue lesion, a biopsy was obtained on 10/27/23. Pathology showed findings consistent with keratinizing invasive squamous cell carcinoma with a minute focus of apparent LVI seen in a small vessel near the deep margin (and with tumor present in all margins of the submitted specimen).   Subsequently, the patient was referred to [facility] and was seen in consultation by Dr. Sallyann on 11/10/23 for further management. Oral examination performed at that time confirmed a 2cm tender, ulcerated right lateral tongue mass. A laryngoscopy was also performed which showed no abnormal findings.  Examination of the neck was also negative for cervical lymphadenopathy.   Imaging was advised for further evaluation and he accordingly presented for a soft tissue neck CT and chest CT at [facility] on 11/23/23 which collectively demonstrated: no definite evidence of invasive of local invasion within the soft tissues of the neck, with bilateral submental and jugular chain lymph nodes measure up to 7 mm in thickness, within normal limits. CT findings were also negative for metastatic disease within the chest.   Based on Dr. Elvis recommendations, he opted to proceed with a partial glossectomy with right cervical lymph node dissections on 12/03/23. Pathology from the procedure showed: tumor the size of 1.3 cm; histology of moderately differentiated invasive squamous cell carcinoma of the tongue (keratinizing) invading a depth of 0.8 cm; positive for focal LVI and intratumoral PNI; Margin positive, nodal status of 29/29 excised lymph nodes negative for carcinoma.   He did return to the ED on 12/15/23 with c/o bleeding from his mouth. Labs collected in the ED were unremarkable and the bleeding had stopped by the time that he was evaluated in the ED. No intervention was required and he was advised to follow-up with ENT in an OP setting.   Swallowing issues, if any: none  Weight Changes: 5lbs of weight loss (likely due to diet changes secondary to tongue pain)  Pain status: tongue is numb, was 11/10 pain prior to surgery. Recently bit his tongue and cause a wound in the Right lateral tongue  Other symptoms: only able to eat soft foods due to initial pain  from the lesion (eventually improved to eating chicken and steak); also reported that even drinking water would irritate his tongue   Tobacco history, if any: Former off/on smoker -- quit  ETOH abuse, if any: stopped drinking  Prior cancers, if any: none  PATH REPORT FROM [facility]: A. Right posterior tongue lesion, biopsy:   Squamous mucosa with patchy,  dense chronic inflammation. Negative for definitive dysplasia and malignancy.      B. Right tongue anterior dorsal margin, excision:   Superficial squamous mucosa with chronic inflammation, see comment.  Negative for definitive dysplasia and malignancy.    Comment: Multiple step sections have been evaluated. Focal squamous atypia is present on the frozen section, which is favored to be reactive. Invasive carcinoma is not seen in this specimen. Please correlate with additional anterior dorsal right tongue (specimen M) below.      C. Right tongue posterior dorsal margin, excision:   Squamous mucosa.  Negative for dysplasia and malignancy.      D. Right tongue anterior ventral margin, excision:   Squamous mucosa.  Negative for dysplasia and malignancy.      E. Right tongue posterior ventral margin, excision:   Squamous mucosa.  Negative for dysplasia and malignancy.      F. Right tongue deep margin, excision:   Skeletal muscle.  Negative for malignancy.     G. Right tongue, partial glossectomy:   Invasive squamous cell carcinoma of tongue, keratinizing, moderately differentiated (1.3 cm). Depth of invasion: 0.8 cm. Focal lymphovascular invasion is identified.  See separately submitted margins and synoptic report.      H. Right neck 1B, neck dissection:   Four lymph nodes, negative for malignancy (0/4). Benign salivary gland.      I. Additional right deep glossectomy, excision:   Skeletal muscle.  Negative for malignancy.     J. Right neck 2B, neck dissection:   Seven lymph nodes, negative for malignancy (0/7).     K. Right neck 2A, neck dissection:   Eleven lymph nodes, negative for malignancy (0/11).     L. Right neck 3 & 4, neck dissection:   Seven lymph nodes, negative for malignancy (0/7).     M. Additional anterior dorsal right tongue, excision:   Invasive carcinoma (0.1 cm).    Comment: A focus of invasive carcinoma is present at the deep  edge of the specimen, located 3.5 mm deep to the epithelium. The previously sampled right tongue anterior dorsal margin (specimen B) is negative for carcinoma; however, the tissue is limited to a maximum depth of 2 mm. Tumor extension from the main mass is favored; however, the possibility of a separate tumor focus cannot be entirely excluded. Per discussion with the surgeon, this area represents true margin. Clinical correlation is recommended.    Electronically signed by Leontine Josette Clause, MD on 12/11/2023 at 1013 EDT  Diagnostic Comment   Margin assessment is discussed with Dr. Redell Pax at 9:30 AM on 12/11/2023.   Synoptic Report ORAL CAVITY ORAL CAVITY - All Specimens 8th Edition - Protocol posted: 08/28/2021  SPECIMEN    Procedure:    Glossectomy: partial  TUMOR    Tumor Focality:    Unifocal    Tumor Site:    Oral cavity    Tumor Laterality:    Right    Tumor Size:    Greatest Dimension (Centimeters): 1.3 cm    Histologic Type:          :    Squamous cell carcinoma, conventional (keratinizing)  Histologic Grade:    G2, moderately differentiated    Tumor Depth of Invasion (DOI):    8 mm    Lymphatic and / or Vascular Invasion:    Present    Perineural Invasion:    Present      Extent / Type of Perineural Invasion:    Intratumoral  MARGINS    Specimen Margin Status for Invasive Tumor:    Invasive tumor present at specimen margin      Specimen Margin(s) Involved by Invasive Tumor:    Anterior dorsal right tongue    Specimen Margin Status for Noninvasive Tumor (High-grade Dysplasia):    All specimen margins negative for high-grade dysplasia / in situ disease    Tumor Bed Margin Status:          Tumor Bed Margin Orientation:    Oriented to true margin surface      Tumor Bed Margin Status for Invasive Tumor:    All tumor bed margins negative for invasive tumor        Distance from Invasive Tumor to True Margin Surface:    Less than 1 mm      Tumor Bed Margin Status for  Noninvasive Tumor:    All tumor bed margins negative for high-grade dysplasia / in situ disease    Margin Comment:    The tumor is multifocally close (<0.5 mm) to the anterior dorsal and deep inked edges of the tumor bed (not true margin). Evaluation of the tumor bed edges is somewhat limited by artifact.  REGIONAL LYMPH NODES    Regional Lymph Node Status:          :    All regional lymph nodes negative for tumor      Number of Lymph Nodes Examined:    29  pTNM CLASSIFICATION (AJCC 8th Edition)    Reporting of pT, pN, and (when applicable) pM categories is based on information available to the pathologist at the time the report is issued. As per the AJCC (Chapter 1, 8th Ed.) it is the managing physician's responsibility to establish the final pathologic stage based upon all pertinent information, including but potentially not limited to this pathology report.    pT Category:    pT2    pN Category:    pN0    PREVIOUS RADIATION THERAPY: No  PAST MEDICAL HISTORY:  has a past medical history of Bipolar disorder (HCC) and Depression.    PAST SURGICAL HISTORY:History reviewed. No pertinent surgical history.  FAMILY HISTORY: family history includes Arthritis in his mother; Cancer in his paternal grandmother; Depression in his mother.  SOCIAL HISTORY:  reports that he has never smoked. He has never used smokeless tobacco. He reports current alcohol use. He reports that he does not use drugs.  ALLERGIES: Patient has no known allergies.  MEDICATIONS:  Current Outpatient Medications  Medication Sig Dispense Refill   fluticasone  (FLONASE ) 50 MCG/ACT nasal spray Place 2 sprays into both nostrils daily. 48 mL 1   gabapentin (NEURONTIN) 100 MG capsule Take 100 mg by mouth 3 (three) times daily.     hydrOXYzine  (ATARAX ) 10 MG tablet Take 1-2 tablets (10-20 mg total) by mouth 3 (three) times daily as needed for anxiety. 30 tablet 1   Melatonin 10 MG TABS Take 10 mg by mouth at bedtime.      fexofenadine  (ALLEGRA  ALLERGY) 180 MG tablet Take 1 tablet (180 mg total) by mouth daily. (Patient not taking: Reported on 12/31/2023) 90 tablet 1   magic  mouthwash (lidocaine , diphenhydrAMINE, alum & mag hydroxide) suspension Swish and spit 5 mLs 4 (four) times daily. (Patient not taking: Reported on 12/31/2023) 200 mL 0   No current facility-administered medications for this encounter.    REVIEW OF SYSTEMS:  Notable for that above.   PHYSICAL EXAM:  height is 5' 8.5 (1.74 m) and weight is 155 lb 3.2 oz (70.4 kg). His temperature is 97.7 F (36.5 C). His blood pressure is 141/85 (abnormal) and his pulse is 64. His respiration is 20 and oxygen saturation is 100%.   General: Alert and oriented, in no acute distress HEENT: Head is normocephalic. Extraocular movements are intact. Oropharynx is notable for healing continues along R lateral oral tongue.  There is swelling/nodularity in the floor of mouth that could be reactive.  Cannot exclude gross tumor in oral cavity.  See photos. Note that pt recently bit his tongue. Neck: Neck is notable for well healed right neck scar from lymph node dissection Heart: Regular in rate and rhythm with no murmurs, rubs, or gallops. Chest: Clear to auscultation bilaterally, with no rhonchi, wheezes, or rales. Abdomen: Soft, nontender, nondistended, with no rigidity or guarding. Extremities: No cyanosis or edema. Lymphatics: see Neck Exam Skin: No concerning lesions. Musculoskeletal: symmetric strength and muscle tone throughout. Neurologic: Cranial nerves II through XII are grossly intact. No obvious focalities. Speech is fluent. Coordination is intact. Psychiatric: Judgment and insight are intact. Affect is appropriate.      ECOG = 1  0 - Asymptomatic (Fully active, able to carry on all predisease activities without restriction)  1 - Symptomatic but completely ambulatory (Restricted in physically strenuous activity but ambulatory and able to carry out  work of a light or sedentary nature. For example, light housework, office work)  2 - Symptomatic, <50% in bed during the day (Ambulatory and capable of all self care but unable to carry out any work activities. Up and about more than 50% of waking hours)  3 - Symptomatic, >50% in bed, but not bedbound (Capable of only limited self-care, confined to bed or chair 50% or more of waking hours)  4 - Bedbound (Completely disabled. Cannot carry on any self-care. Totally confined to bed or chair)  5 - Death   Raylene MM, Creech RH, Tormey DC, et al. [phone]). Toxicity and response criteria of the [facility]. Am. DOROTHA Bridges. Oncol. 5 (6): 649-55   LABORATORY DATA:  Lab Results  Component Value Date   WBC 4.4 06/24/2022   HGB 15.6 06/24/2022   HCT 45.8 06/24/2022   MCV 92.8 06/24/2022   PLT 248.0 06/24/2022   CMP     Component Value Date/Time   NA 138 06/24/2022 1008   NA 140 01/14/2016 1107   K 4.2 06/24/2022 1008   CL 102 06/24/2022 1008   CO2 30 06/24/2022 1008   GLUCOSE 83 06/24/2022 1008   BUN 11 06/24/2022 1008   BUN 13 01/14/2016 1107   CREATININE 0.93 06/24/2022 1008   CALCIUM 9.3 06/24/2022 1008   PROT 7.0 06/24/2022 1008   PROT 7.2 07/09/2015 1021   ALBUMIN 4.5 06/24/2022 1008   ALBUMIN 4.5 07/09/2015 1021   AST 20 06/24/2022 1008   ALT 17 06/24/2022 1008   ALKPHOS 54 06/24/2022 1008   BILITOT 1.4 (H) 06/24/2022 1008   BILITOT 0.7 07/09/2015 1021   GFRNONAA 121 01/14/2016 1107   GFRAA 140 01/14/2016 1107      Lab Results  Component Value Date   TSH 1.10 06/24/2022  RADIOGRAPHY:  as above    IMPRESSION/PLAN: pT2N0 oral tongue cancer with LVI and PNI and margins +  This is a delightful patient with head and neck cancer. I  recommend 6-6.5 weeks of adjuvant radiotherapy for this patient.  We discussed the potential risks, benefits, and side effects of radiotherapy. We talked in detail about acute and late effects. We discussed that some of  the most bothersome acute effects may be mucositis, dysgeusia, salivary changes, skin irritation, hair loss, dehydration, weight loss and fatigue. We talked about late effects which include but are not necessarily limited to dysphagia, hypothyroidism, nerve injury, vascular injury, spinal cord injury, xerostomia, trismus, neck edema, dental issues, non-healing wound, and potentially fatal injury to any of the tissues in the head and neck region. No guarantees of treatment were given. A consent form was signed and placed in the patient's medical record. The patient is enthusiastic about proceeding with treatment. I look forward to participating in the patient's care.    Simulation (treatment planning) will take place after dental clearance  We also discussed that the treatment of head and neck cancer is a multidisciplinary process to maximize treatment outcomes and quality of life. For this reason the following referrals have been or will be made:    Medical oncology to discuss chemotherapy     Dentistry for dental evaluation, possible extractions in the radiation fields, and /or advice on reducing risk of cavities, osteoradionecrosis, or other oral issues. Possible SPDs.  I would also like Dr Danial to inspect his tongue and floor of mouth to determine if this is normal healing or if there is any sign of tumor recurrence. Special document predicted RT dose distribution sent to her   Nutritionist for nutrition support during and after treatment.   Speech language pathology for swallowing and/or speech therapy.   Social work for social support.    Physical therapy due to risk of lymphedema in neck and deconditioning.   Baseline labs including TSH.  On date of service, in total, I spent 80 minutes on this encounter. Patient was seen in person. Note signed after encounter date; minutes pertain to date of service, only.  ADDENDUM: margin determined to be positive at our tumor board;  consult to be  in near future w/ med onc. __________________________________________   Lauraine Golden, MD  This document serves as a record of services personally performed by Lauraine Golden, MD. It was created on her behalf by Dorthy Fuse, a trained medical scribe. The creation of this record is based on the scribe's personal observations and the provider's statements to them. This document has been checked and approved by the attending provider.

## 2024-01-01 ENCOUNTER — Ambulatory Visit
Admission: RE | Admit: 2024-01-01 | Discharge: 2024-01-01 | Disposition: A | Discharge status: Home / Self Care | Source: Ambulatory Visit | Attending: Radiation Oncology | Admitting: Radiation Oncology

## 2024-01-01 ENCOUNTER — Other Ambulatory Visit: Payer: Self-pay

## 2024-01-01 VITALS — BP 141/85 | HR 64 | Temp 97.7°F | Resp 20 | Ht 68.5 in | Wt 155.2 lb

## 2024-01-01 DIAGNOSIS — D0007 Carcinoma in situ of tongue: Secondary | ICD-10-CM | POA: Diagnosis not present

## 2024-01-01 DIAGNOSIS — C023 Malignant neoplasm of anterior two-thirds of tongue, part unspecified: Secondary | ICD-10-CM

## 2024-01-01 DIAGNOSIS — C029 Malignant neoplasm of tongue, unspecified: Secondary | ICD-10-CM

## 2024-01-01 NOTE — Progress Notes (Signed)
 Oncology Nurse Navigator Documentation   Met with patient during initial consult with Dr. Izell.  He was accompanied by his wife, Andrez. I introduced myself as his/their Navigator, explained my role as a member of the Care Team. Provided New Patient resource guide binder: Contact information for physicians, this navigator, other members of the Care Team Advance Directive information; provided [facility] AD booklet at their request,  Fall Prevention Patient Safety Plan Financial Assistance Information sheet Symptom Management Clinic information [facility] campus map with highlight of [facility] SLP Information sheet Head and Neck cancer basics Nutrition information Patient and family support information including Spiritual care/Chaplain information, Peer mentor program, health and wellness classes, and the survivorship program Community resources  Provided and discussed educational handouts for PEG and PAC. Assisted with post-consult appt scheduling. He is aware that I will refer him to Dr. Danial DDS for dental clearance and possible scatter protective guards before radiation begins.   I toured him to the [facility] treatment area, explained procedures for lobby registration, arrival to Radiation Waiting, and arrival to treatment area.    They verbalized understanding of information provided. I encouraged them to call with questions/concerns moving forward.  [name], RN, BSN, OCN Head & Neck Oncology Nurse Navigator [facility] [phone]

## 2024-01-03 DIAGNOSIS — C023 Malignant neoplasm of anterior two-thirds of tongue, part unspecified: Secondary | ICD-10-CM | POA: Insufficient documentation

## 2024-01-03 NOTE — Progress Notes (Addendum)
 Dental Form with Estimates of Radiation Dose  Benjamin Choi Date of birth: 07/24/1990     Diagnosis: Tongue Cancer C02.3  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)     pT2N0  Prognosis: curative  Anticipated # of fractions: 30-33    Daily?: yes  # of weeks of radiotherapy: 6-6.5  Chemotherapy?: TBD  Anticipated xerostomia:  Mild permanent   Pre-simulation needs:  SPDs?  Simulation: ASAP     Other Notes:  PATIENT BIT TONGUE RECENTLY.  HAS WOUNDS IN LATERAL R TONGUE AND FLOOR OF MOUTH HAS SOME NODULARITY. PLEASE LET DR IZELL KNOW IF YOU ARE SUSPICIOUS OF ANY LOCAL RECURRENCE AND Please Biopsy if needed   Please contact Lauraine Izell, MD, with patient's disposition after evaluation and/or dental treatment. -----------------------------------  Lauraine Izell, MD

## 2024-01-07 DIAGNOSIS — K1379 Other lesions of oral mucosa: Secondary | ICD-10-CM | POA: Diagnosis not present

## 2024-01-07 DIAGNOSIS — K121 Other forms of stomatitis: Secondary | ICD-10-CM | POA: Diagnosis not present

## 2024-01-07 DIAGNOSIS — Z91843 Risk for dental caries, high: Secondary | ICD-10-CM | POA: Diagnosis not present

## 2024-01-07 DIAGNOSIS — K08409 Partial loss of teeth, unspecified cause, unspecified class: Secondary | ICD-10-CM | POA: Diagnosis not present

## 2024-01-07 DIAGNOSIS — Z9889 Other specified postprocedural states: Secondary | ICD-10-CM | POA: Diagnosis not present

## 2024-01-07 DIAGNOSIS — C029 Malignant neoplasm of tongue, unspecified: Secondary | ICD-10-CM | POA: Diagnosis not present

## 2024-01-13 ENCOUNTER — Encounter: Payer: Self-pay | Admitting: Oncology

## 2024-01-13 ENCOUNTER — Inpatient Hospital Stay

## 2024-01-13 ENCOUNTER — Inpatient Hospital Stay: Attending: Oncology | Admitting: Oncology

## 2024-01-13 VITALS — BP 124/72 | HR 76 | Temp 98.8°F | Resp 19 | Ht 68.5 in | Wt 155.2 lb

## 2024-01-13 DIAGNOSIS — Z9221 Personal history of antineoplastic chemotherapy: Secondary | ICD-10-CM | POA: Diagnosis not present

## 2024-01-13 DIAGNOSIS — C029 Malignant neoplasm of tongue, unspecified: Secondary | ICD-10-CM

## 2024-01-13 DIAGNOSIS — C023 Malignant neoplasm of anterior two-thirds of tongue, part unspecified: Secondary | ICD-10-CM | POA: Insufficient documentation

## 2024-01-13 DIAGNOSIS — C021 Malignant neoplasm of border of tongue: Secondary | ICD-10-CM | POA: Diagnosis not present

## 2024-01-13 DIAGNOSIS — Z923 Personal history of irradiation: Secondary | ICD-10-CM | POA: Diagnosis not present

## 2024-01-13 DIAGNOSIS — Z5111 Encounter for antineoplastic chemotherapy: Secondary | ICD-10-CM | POA: Insufficient documentation

## 2024-01-13 DIAGNOSIS — Z79899 Other long term (current) drug therapy: Secondary | ICD-10-CM | POA: Insufficient documentation

## 2024-01-13 LAB — CMP (CANCER CENTER ONLY)
ALT: 13 U/L (ref 0–44)
AST: 18 U/L (ref 15–41)
Albumin: 5 g/dL (ref 3.5–5.0)
Alkaline Phosphatase: 65 U/L (ref 38–126)
Anion gap: 6 (ref 5–15)
BUN: 11 mg/dL (ref 6–20)
CO2: 31 mmol/L (ref 22–32)
Calcium: 10.2 mg/dL (ref 8.9–10.3)
Chloride: 102 mmol/L (ref 98–111)
Creatinine: 0.83 mg/dL (ref 0.61–1.24)
GFR, Estimated: >60 mL/min (ref >60)
Glucose, Bld: 92 mg/dL (ref 70–99)
Potassium: 4.1 mmol/L (ref 3.5–5.1)
Sodium: 139 mmol/L (ref 135–145)
Total Bilirubin: 0.7 mg/dL (ref 0.0–1.2)
Total Protein: 8.1 g/dL (ref 6.5–8.1)

## 2024-01-13 LAB — CBC WITH DIFFERENTIAL (CANCER CENTER ONLY)
Abs Immature Granulocytes: 0.01 K/uL (ref 0.00–0.07)
Basophils Absolute: 0.1 K/uL (ref 0.0–0.1)
Basophils Relative: 1 %
Eosinophils Absolute: 0.1 K/uL (ref 0.0–0.5)
Eosinophils Relative: 2 %
HCT: 45.5 % (ref 39.0–52.0)
Hemoglobin: 15.8 g/dL (ref 13.0–17.0)
Immature Granulocytes: 0 %
Lymphocytes Relative: 30 %
Lymphs Abs: 2 K/uL (ref 0.7–4.0)
MCH: 31 pg (ref 26.0–34.0)
MCHC: 34.7 g/dL (ref 30.0–36.0)
MCV: 89.4 fL (ref 80.0–100.0)
Monocytes Absolute: 0.6 K/uL (ref 0.1–1.0)
Monocytes Relative: 9 %
Neutro Abs: 3.7 K/uL (ref 1.7–7.7)
Neutrophils Relative %: 58 %
Platelet Count: 256 K/uL (ref 150–400)
RBC: 5.09 MIL/uL (ref 4.22–5.81)
RDW: 12.4 % (ref 11.5–15.5)
WBC Count: 6.5 K/uL (ref 4.0–10.5)
nRBC: 0 % (ref 0.0–0.2)

## 2024-01-13 LAB — TSH: TSH: 1.13 u[IU]/mL (ref 0.350–4.500)

## 2024-01-13 MED ORDER — DEXAMETHASONE 4 MG PO TABS: ORAL_TABLET | ORAL | 1 refills | Status: AC

## 2024-01-13 MED ORDER — ONDANSETRON HCL 8 MG PO TABS: 8.0000 mg | ORAL_TABLET | Freq: Three times a day (TID) | ORAL | 1 refills | Status: AC | PRN

## 2024-01-13 MED ORDER — LIDOCAINE-PRILOCAINE 2.5-2.5 % EX CREA: TOPICAL_CREAM | CUTANEOUS | 3 refills | Status: AC

## 2024-01-13 MED ORDER — PROCHLORPERAZINE MALEATE 10 MG PO TABS
10.0000 mg | ORAL_TABLET | Freq: Four times a day (QID) | ORAL | 1 refills | Status: AC | PRN
Start: 2024-01-13 — End: ?

## 2024-01-13 NOTE — Progress Notes (Signed)
START ON PATHWAY REGIMEN - Head and Neck     A cycle is every 7 days:     Cisplatin   **Always confirm dose/schedule in your pharmacy ordering system**  Patient Characteristics: Oral Cavity, Postoperative without Neoadjuvant Therapy, High Risk Disease Classification: Oral Cavity AJCC T Category: T2 AJCC M Category: M0 AJCC N Category: pN0 AJCC 8 Stage Grouping: II Therapeutic Status: Postoperative without Neoadjuvant Therapy Risk Status: High Risk Intent of Therapy: Curative Intent, Discussed with Patient

## 2024-01-13 NOTE — Addendum Note (Signed)
 Encounter addended by: Izell Domino, MD on: 01/13/2024 8:40 AM  Actions taken: Clinical Note Signed

## 2024-01-13 NOTE — Progress Notes (Signed)
 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  Focal positive for LVI and intratumoral PNI.  Margins positive.  ASSESSMENT & PLAN:   Benjamin Choi is a 33 y.o. 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 Focal positive for LVI and intratumoral PNI.  Margins positive.  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) Please review oncology history for additional details and timeline of events.  He underwent partial glossectomy with right cervical lymph node dissections on 12/03/23. 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 pT2, pN0, cM0.  His case was discussed in ENT multidisciplinary tumor conference on 01/13/2024.  Because of high risk features including positive margin status, focal positivity for LVI and intratumoral PNI,  recommendation made to proceed with adjuvant concurrent chemoradiation.    Today I discussed recent developments, findings, pathology reports and consensus opinion from ENT tumor conference with the patient and his wife who was accompanying.  They verbalized understanding.  He is in agreement with the plan to proceed with concurrent chemoradiation, given positive margins.  We have discussed about role of cisplatin being a radiosensitizer in the treatment of head and neck cancer.  We have discussed about the curative intent of chemoradiation for this patient.     We have discussed about mechanism of action of cisplatin, adverse effects of cisplatin including but not limited to fatigue, nausea, vomiting, increased risk of infections, mucositis, ototoxicity, nephrotoxicity, peripheral neuropathy.  Patient understands that some of the side effects can be permanent and even potentially fatal.  We have discussed about role of Mediport and G-tube for chemotherapy administration and nutrition respectively since most of these patients have severe mucositis during the treatment.  At this time we do not know if weekly cisplatin is inferior to every 21 days cisplatin since there is no head-to-head comparison trial.  I did mention to the patient however weekly cisplatin is well-tolerated with less adverse effects and most of the patients tend to complete treatment as planned.  Patient is willing to proceed with weekly cisplatin.  We will arrange for chemo education, Port-A-Cath placement and plan to begin chemotherapy around the same time that he begins radiation treatments.  We will pre-schedule feeding tube placement around the 4th or 5th week of treatment.  Patient prefers to avoid feeding tube, if he can maintain nutrition and hydration.  Referrals sent for nutritionist, speech-language pathology, physical therapy for supportive care.  Provided letter for case manager to extend his  short-term disability  coverage.  I will plan to see him in the next 2 weeks to begin chemotherapy with cisplatin.   I reviewed lab results and outside records for this visit and discussed relevant results with the patient. Diagnosis, plan of care and treatment options were also discussed in detail with the patient. Opportunity provided to ask questions and answers provided to his apparent satisfaction. Provided instructions to call our clinic with any problems, questions or concerns prior to return visit. I recommended to continue follow-up with PCP and sub-specialists. He verbalized understanding and agreed with the plan. No barriers to learning was detected.  NCCN guidelines have been consulted in the planning of this patient's care.  Chinita Patten, MD  01/13/2024 4:21 PM  [facility] - A DEPT OF [facility] [address] [place] [address] Dept: [phone] Dept Fax: [phone]   HISTORY OF PRESENTING ILLNESS:   I have reviewed his chart and materials related to his cancer extensively and collaborated history with the patient. Summary of oncologic history is as follows:  ONCOLOGY HISTORY:  He initially presented to his PCP in December of 2024 with a painful sore on his right lateral tongue. He was given triamcinolone  paste at that time to manage this without relief achieved.  He returned to his PCP in May of 2025 with c/o persistent pain from the lesion and was advised to try magic mouthwash which again did not provide him any relief. He was subsequently advised to follow-up with his dentist who shaved down a sharp molar to help prevent further irritation against his tongue.   Given persistence of the painful tongue lesion, a biopsy was obtained on 10/27/23. Pathology showed findings consistent with keratinizing invasive squamous cell carcinoma with a minute focus of apparent LVI seen in a small vessel near the deep margin (and with tumor  present in all margins of the submitted specimen).    Subsequently, the patient was referred to [facility] and was seen in consultation by Dr. Sallyann on 11/10/23 for further management. Oral examination performed at that time confirmed a 2 cm tender, ulcerated right lateral tongue mass. A laryngoscopy was also performed which showed no abnormal findings. Examination of the neck was also negative for cervical lymphadenopathy.    Imaging was advised for further evaluation and he accordingly presented for a soft tissue neck CT and chest CT at [facility] on 11/23/23 which collectively demonstrated: no definite evidence of invasive of local invasion within the soft tissues of the neck, with bilateral submental and jugular chain lymph nodes measure up to 7 mm in thickness, within normal limits. CT findings were also negative for metastatic disease within the chest.    Based on Dr. Elvis recommendations, he opted to proceed with a partial glossectomy with right cervical lymph node dissections on 12/03/23. 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 pT2, pN0, cM0   He did return to the ED on 12/15/23 with c/o bleeding from his mouth. Labs collected in the ED were unremarkable and the bleeding had stopped by the time that he was evaluated in the ED. No intervention was required and he was advised to follow-up with ENT in an OP setting.  Swallowing issues, if any: none   Weight Changes: 5 lbs of weight loss (likely due to diet changes secondary to tongue pain)   Pain status: tongue is numb, was 10/10 pain prior to surgery. Recently bit his tongue and caused a wound in the Right lateral tongue.  Had dental evaluation and no intervention  was needed.   Other symptoms: only able to eat soft foods due to initial pain from the lesion (eventually improved to eating chicken and steak); also reported that even drinking water would irritate his tongue    Tobacco history, if any: Former off/on smoker -- quit   ETOH abuse, if any: stopped drinking.  His case was discussed in ENT multidisciplinary tumor conference on 01/13/2024.  Because of high risk features including positive margin status, focal positivity for LVI and intratumoral PNI, recommendation made to proceed with adjuvant concurrent chemoradiation.  Plan to proceed with weekly cisplatin during the course of radiation.    Oncology History  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)  01/03/2024 Initial Diagnosis   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)   01/13/2024 Cancer Staging   Staging form: Oral Cavity, AJCC 8th Edition - Pathologic: Stage II (pT2, pN0, cM0) - Signed by Autumn Millman, MD on 01/13/2024 Stage prefix: Initial diagnosis Residual tumor (R): R1 Lymph-vascular invasion (LVI): LVI present/identified, NOS Perineural invasion (PNI): Present Subclassification of lymphovascular invasion: Intratumoral   01/28/2024 -  Chemotherapy   Patient is on Treatment Plan : HEAD/NECK Cisplatin (40) q7d       INTERVAL HISTORY:  Discussed the use of AI scribe software for clinical note transcription with the patient, who gave verbal consent to proceed.  History of Present Illness Benjamin Choi is a 33 year old male with tongue cancer who presents for oncology consultation regarding treatment options. He was referred by Dr. Izell for further evaluation and treatment planning.  He has a history of tongue cancer, for which he underwent surgery at [facility]. The pathology report indicated a positive margin at the deep edge, and noted perineural invasion and lymphovascular invasion.  No history of smoking or  alcohol use since the onset of his condition. No prior irritation or dental issues in the affected area. He has not experienced significant side effects from previous treatments but is concerned about potential side effects from chemotherapy and radiation, including the possibility of needing a feeding tube.  A recent CT scan of the neck and chest was performed. He is concerned about the potential for cancer recurrence and seeks information on the likelihood of this occurring.  He is currently not on any specific medications related to his cancer treatment.  Socially, he works as a armed forces training and education officer and has two children, aged seven and five. He is seeking a note for his case manager to extend his time off work due to his treatment schedule.    MEDICAL HISTORY:  Past Medical History:  Diagnosis Date   Bipolar disorder (HCC)    Depression     SURGICAL HISTORY: No past surgical history on file.  SOCIAL HISTORY: He reports that he has never smoked. He has never used smokeless tobacco. He reports current alcohol use. He reports that he does not use drugs. Social History   Socioeconomic History   Marital status: Married    Spouse name: Andrez   Number of children: 1   Years of education: Not on file   Highest education level: Not on file  Occupational History   Not on file  Tobacco Use   Smoking status: Never   Smokeless tobacco: Never  Vaping Use   Vaping status: Never Used  Substance and Sexual Activity   Alcohol use: Yes    Comment: socially   Drug use: No   Sexual activity: Yes    Birth control/protection: None  Other Topics Concern   Not on file  Social History Narrative   Fulltime: Chiropractor (4)   [employer] (7)      Hobbies: play music and dirt bikes    Social Drivers of Health   Financial Resource Strain: Low Risk  (12/03/2023)   Received from [facility]   Overall Financial Resource Strain (CARDIA)    Difficulty of Paying  Living Expenses: Not hard at all  Food Insecurity: No Food Insecurity (12/31/2023)   Hunger Vital Sign    Worried About Running Out of Food in the Last Year: Never true    Ran Out of Food in the Last Year: Never true  Transportation Needs: No Transportation Needs (12/31/2023)   PRAPARE - Administrator, Civil Service (Medical): No    Lack of Transportation (Non-Medical): No  Physical Activity: Not on file  Stress: Not on file  Social Connections: Not on file  Intimate Partner Violence: Not At Risk (12/31/2023)   Humiliation, Afraid, Rape, and Kick questionnaire    Fear of Current or Ex-Partner: No    Emotionally Abused: No    Physically Abused: No    Sexually Abused: No    FAMILY HISTORY: Family History  Problem Relation Age of Onset   Arthritis Mother    Depression Mother    Cancer Paternal Grandmother        skin cancer    ALLERGIES:  He has no known allergies.  MEDICATIONS:  Current Outpatient Medications  Medication Sig Dispense Refill   fluticasone  (FLONASE ) 50 MCG/ACT nasal spray Place 2 sprays into both nostrils daily. 48 mL 1   gabapentin (NEURONTIN) 100 MG capsule Take 100 mg by mouth 3 (three) times daily.     hydrOXYzine  (ATARAX ) 10 MG tablet Take 1-2 tablets (10-20 mg total) by mouth 3 (three) times daily as needed for anxiety. 30 tablet 1   Melatonin 10 MG TABS Take 10 mg by mouth at bedtime.     oxyCODONE (ROXICODONE) 5 MG/5ML solution Take 5 mg by mouth every 6 (six) hours as needed.     dexamethasone (DECADRON) 4 MG tablet Take 2 tablets (8 mg) by mouth daily x 3 days starting the day after cisplatin chemotherapy. Take with food. 30 tablet 1   fexofenadine  (ALLEGRA  ALLERGY) 180 MG tablet Take 1 tablet (180 mg total) by mouth daily. (Patient not taking: Reported on 01/13/2024) 90 tablet 1   lidocaine -prilocaine (EMLA) cream Apply to affected area once 30 g 3   magic mouthwash (lidocaine , diphenhydrAMINE, alum & mag hydroxide) suspension Swish and  spit 5 mLs 4 (four) times daily. (Patient not taking: Reported on 01/13/2024) 200 mL 0   ondansetron (ZOFRAN) 8 MG tablet Take 1 tablet (8 mg total) by mouth every 8 (eight) hours as needed for nausea or vomiting. Start on the third day after cisplatin. 30 tablet 1   prochlorperazine (COMPAZINE) 10 MG tablet Take 1 tablet (10 mg total) by mouth every 6 (six) hours as needed (Nausea or vomiting). 30 tablet 1   No current facility-administered medications for this visit.  REVIEW OF SYSTEMS:    Review of Systems - Oncology  All other pertinent systems were reviewed with the patient and are negative.  PHYSICAL EXAMINATION:   Onc Performance Status - 01/13/24 1316       ECOG Perf Status   ECOG Perf Status Restricted in physically strenuous activity but ambulatory and able to carry out work of a light or sedentary nature, e.g., light house work, office work      KPS SCALE   KPS % SCORE Normal activity with effort, some s/s of disease          Vitals:   01/13/24 1305  BP: 124/72  Pulse: 76  Resp: 19  Temp: 98.8 F (37.1 C)  SpO2: 98%   Filed Weights   01/13/24 1305  Weight: 155 lb 3.2 oz (70.4 kg)    Physical Exam Constitutional:      General: He is not in acute distress.    Appearance: Normal appearance.  HENT:     Head: Normocephalic and atraumatic.     Mouth/Throat:     Comments: Right lateral partial glossectomy site is healed well without any residual ulceration. There is swelling/nodularity in the floor of mouth that could be reactive.  Cannot exclude gross tumor in oral cavity.  Eyes:     Conjunctiva/sclera: Conjunctivae normal.  Neck:     Comments: Right-sided neck dissection scar is healing well without any signs of infection. Cardiovascular:     Rate and Rhythm: Normal rate and regular rhythm.     Heart sounds: Normal heart sounds.  Pulmonary:     Effort: Pulmonary effort is normal. No respiratory distress.     Breath sounds: Normal breath sounds.   Abdominal:     General: There is no distension.  Lymphadenopathy:     Cervical: No cervical adenopathy.  Neurological:     General: No focal deficit present.     Mental Status: He is alert and oriented to person, place, and time.  Psychiatric:        Mood and Affect: Mood normal.        Behavior: Behavior normal.       LABORATORY DATA:   I have reviewed the data as listed.  Results for orders placed or performed in visit on 01/13/24  CBC with Differential (Cancer Center Only)  Result Value Ref Range   WBC Count 6.5 4.0 - 10.5 K/uL   RBC 5.09 4.22 - 5.81 MIL/uL   Hemoglobin 15.8 13.0 - 17.0 g/dL   HCT 54.4 60.9 - 47.9 %   MCV 89.4 80.0 - 100.0 fL   MCH 31.0 26.0 - 34.0 pg   MCHC 34.7 30.0 - 36.0 g/dL   RDW 87.5 88.4 - 84.4 %   Platelet Count 256 150 - 400 K/uL   nRBC 0.0 0.0 - 0.2 %   Neutrophils Relative % 58 %   Neutro Abs 3.7 1.7 - 7.7 K/uL   Lymphocytes Relative 30 %   Lymphs Abs 2.0 0.7 - 4.0 K/uL   Monocytes Relative 9 %   Monocytes Absolute 0.6 0.1 - 1.0 K/uL   Eosinophils Relative 2 %   Eosinophils Absolute 0.1 0.0 - 0.5 K/uL   Basophils Relative 1 %   Basophils Absolute 0.1 0.0 - 0.1 K/uL   Immature Granulocytes 0 %   Abs Immature Granulocytes 0.01 0.00 - 0.07 K/uL  CMP (Cancer Center only)  Result Value Ref Range   Sodium 139 135 - 145 mmol/L   Potassium 4.1  3.5 - 5.1 mmol/L   Chloride 102 98 - 111 mmol/L   CO2 31 22 - 32 mmol/L   Glucose, Bld 92 70 - 99 mg/dL   BUN 11 6 - 20 mg/dL   Creatinine 9.16 9.38 - 1.24 mg/dL   Calcium 89.7 8.9 - 89.6 mg/dL   Total Protein 8.1 6.5 - 8.1 g/dL   Albumin 5.0 3.5 - 5.0 g/dL   AST 18 15 - 41 U/L   ALT 13 0 - 44 U/L   Alkaline Phosphatase 65 38 - 126 U/L   Total Bilirubin 0.7 0.0 - 1.2 mg/dL   GFR, Estimated >39 >39 mL/min   Anion gap 6 5 - 15     RADIOGRAPHIC STUDIES:  I have personally reviewed the radiology reports from September 2025.  Orders Placed This Encounter  Procedures   Consent  Attestation for Oncology Treatment    The patient is informed of risks, benefits, side-effects of the prescribed oncology treatment. Potential short term and long term side effects and response rates discussed. After a long discussion, the patient made informed decision to proceed.:   Yes   CBC with Differential (Cancer Center Only)    Standing Status:   Future    Number of Occurrences:   1    Expiration Date:   01/12/2025   CMP (Cancer Center only)    Standing Status:   Future    Number of Occurrences:   1    Expiration Date:   01/12/2025   TSH    Standing Status:   Future    Number of Occurrences:   1    Expiration Date:   01/12/2025   CBC with Differential (Cancer Center Only)    Standing Status:   Future    Expected Date:   01/28/2024    Expiration Date:   01/27/2025   Basic Metabolic Panel - Cancer Center Only    Standing Status:   Future    Expected Date:   01/28/2024    Expiration Date:   01/27/2025   Magnesium    Standing Status:   Future    Expected Date:   01/28/2024    Expiration Date:   01/27/2025   CBC with Differential (Cancer Center Only)    Standing Status:   Future    Expected Date:   02/04/2024    Expiration Date:   02/03/2025   Basic Metabolic Panel - Cancer Center Only    Standing Status:   Future    Expected Date:   02/04/2024    Expiration Date:   02/03/2025   Magnesium    Standing Status:   Future    Expected Date:   02/04/2024    Expiration Date:   02/03/2025   CBC with Differential (Cancer Center Only)    Standing Status:   Future    Expected Date:   02/11/2024    Expiration Date:   02/10/2025   Basic Metabolic Panel - Cancer Center Only    Standing Status:   Future    Expected Date:   02/11/2024    Expiration Date:   02/10/2025   Magnesium    Standing Status:   Future    Expected Date:   02/11/2024    Expiration Date:   02/10/2025   CBC with Differential (Cancer Center Only)    Standing Status:   Future    Expected Date:   02/18/2024    Expiration  Date:   02/17/2025   Basic Metabolic Panel -  Cancer Center Only    Standing Status:   Future    Expected Date:   02/18/2024    Expiration Date:   02/17/2025   Magnesium    Standing Status:   Future    Expected Date:   02/18/2024    Expiration Date:   02/17/2025   CBC with Differential (Cancer Center Only)    Standing Status:   Future    Expected Date:   02/25/2024    Expiration Date:   02/24/2025   Basic Metabolic Panel - Cancer Center Only    Standing Status:   Future    Expected Date:   02/25/2024    Expiration Date:   02/24/2025   Magnesium    Standing Status:   Future    Expected Date:   02/25/2024    Expiration Date:   02/24/2025   CBC with Differential (Cancer Center Only)    Standing Status:   Future    Expected Date:   03/03/2024    Expiration Date:   03/03/2025   Basic Metabolic Panel - Cancer Center Only    Standing Status:   Future    Expected Date:   03/03/2024    Expiration Date:   03/03/2025   Magnesium    Standing Status:   Future    Expected Date:   03/03/2024    Expiration Date:   03/03/2025   PHYSICIAN COMMUNICATION ORDER    A baseline Audiogram is recommended prior to initiation of cisplatin chemotherapy.    CODE STATUS:   Future Appointments  Date Time Provider Department Center  01/18/2024  9:45 AM [facility] NURSE [facility] None  01/18/2024 10:30 AM Izell Domino, MD [facility] None  01/27/2024 12:30 PM Izell Domino, MD [facility] None  01/28/2024 11:00 AM [facility] LINAC 4 [facility] None  01/29/2024 10:00 AM [facility] [id number] [facility] None  01/31/2024  9:15 AM [facility] [id number] [facility] None  02/01/2024  9:45 AM [facility] LINAC 3 [facility] None  02/02/2024  9:45 AM [facility] LINAC 3 [facility] None  02/03/2024  9:30 AM [facility] LINAC 4 [facility] None  02/08/2024  9:15 AM [facility] [id number] [facility] None  02/09/2024  9:15 AM [facility] [id number] [facility] None  02/10/2024  9:15 AM [facility] [id number]  [facility] None  02/11/2024  9:15 AM [facility] [id number] [facility] None  02/12/2024  9:15 AM [facility] [id number] [facility] None  02/15/2024  9:15 AM [facility] [id number] [facility] None  02/16/2024  9:15 AM [facility] [id number] [facility] None  02/17/2024  9:15 AM [facility] [id number] [facility] None  02/18/2024  9:15 AM [facility] [id number] [facility] None  02/19/2024  9:15 AM [facility] [id number] [facility] None  02/22/2024  9:15 AM [facility] [id number] [facility] None  02/23/2024  9:15 AM [facility] [id number] [facility] None  02/24/2024  9:15 AM [facility] [id number] [facility] None  02/25/2024  9:15 AM [facility] [id number] [facility] None  02/26/2024  9:15 AM [facility] [id number] [facility] None  02/29/2024  9:15 AM [facility] [id number] [facility] None  03/01/2024  9:15 AM [facility] [id number] [facility] None  03/02/2024  9:15 AM [facility] [id number] [facility] None  03/07/2024  9:15 AM [facility] [id number] [facility] None  03/08/2024  9:15 AM [facility] [id number] [facility] None  03/09/2024  9:15 AM [facility] [id number] [facility] None  03/11/2024  9:15 AM [facility] [id number] [facility] None  03/14/2024  9:15 AM [facility] [id number] [facility] None  03/15/2024  9:15 AM [facility] [id number] [facility] None  03/16/2024  9:15 AM [facility] [id number] [facility] None  03/17/2024  9:15 AM [facility] [id number] [facility] None     I spent a total of 70  minutes during this encounter with the patient including review of chart and various tests results, discussions about plan of care and coordination of care plan.  This document was completed utilizing speech recognition software. Grammatical errors, random word insertions, pronoun errors, and incomplete sentences are an occasional consequence of this system due to software limitations, ambient noise, and hardware issues. Any formal questions or concerns about the content, text or information contained  within the body of this dictation should be directly addressed to the provider for clarification.

## 2024-01-13 NOTE — Assessment & Plan Note (Signed)
 Please review oncology history for additional details and timeline of events.  He underwent partial glossectomy with right cervical lymph node dissections on 12/03/23. 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 pT2, pN0, cM0.  His case was discussed in ENT multidisciplinary tumor conference on 01/13/2024.  Because of high risk features including positive margin status, focal positivity for LVI and intratumoral PNI, recommendation made to proceed with adjuvant concurrent chemoradiation.    Today I discussed recent developments, findings, pathology reports and consensus opinion from ENT tumor conference with the patient and his wife who was accompanying.  They verbalized understanding.  He is in agreement with the plan to proceed with concurrent chemoradiation, given positive margins.  We have discussed about role of cisplatin being a radiosensitizer in the treatment of head and neck cancer.  We have discussed about the curative intent of chemoradiation for this patient.     We have discussed about mechanism of action of cisplatin, adverse effects of cisplatin including but not limited to fatigue, nausea, vomiting, increased risk of infections, mucositis, ototoxicity, nephrotoxicity, peripheral neuropathy.  Patient understands that some of the side effects can be permanent and even potentially fatal.  We have discussed about role of Mediport and G-tube for chemotherapy administration and nutrition respectively since most of these patients have severe mucositis during the treatment.  At this time we do not know if weekly cisplatin is inferior to every 21 days cisplatin since there is no head-to-head comparison trial.  I did  mention to the patient however weekly cisplatin is well-tolerated with less adverse effects and most of the patients tend to complete treatment as planned.  Patient is willing to proceed with weekly cisplatin.  We will arrange for chemo education, Port-A-Cath placement and plan to begin chemotherapy around the same time that he begins radiation treatments.  We will pre-schedule feeding tube placement around the 4th or 5th week of treatment.  Patient prefers to avoid feeding tube, if he can maintain nutrition and hydration.  Referrals sent for nutritionist, speech-language pathology, physical therapy for supportive care.  Provided letter for case manager to extend his short-term disability coverage.  I will plan to see him in the next 2 weeks to begin chemotherapy with cisplatin.

## 2024-01-14 ENCOUNTER — Other Ambulatory Visit: Payer: Self-pay

## 2024-01-14 DIAGNOSIS — C029 Malignant neoplasm of tongue, unspecified: Secondary | ICD-10-CM

## 2024-01-15 ENCOUNTER — Telehealth: Payer: Self-pay

## 2024-01-15 NOTE — Telephone Encounter (Signed)
 Pt called into the office to see if we have received his disability form in the office. I stated  That we have not received a form. I gave the pt  the email address as well as the fax number. Pt verbalized understanding. He stated that he will call the disability claim office.

## 2024-01-18 ENCOUNTER — Ambulatory Visit
Admission: RE | Admit: 2024-01-18 | Discharge: 2024-01-18 | Disposition: A | Discharge status: Home / Self Care | Source: Ambulatory Visit | Attending: Radiation Oncology | Admitting: Radiation Oncology

## 2024-01-18 VITALS — BP 133/82 | HR 84 | Temp 97.3°F | Resp 18 | Ht 68.5 in | Wt 155.4 lb

## 2024-01-18 DIAGNOSIS — Z5111 Encounter for antineoplastic chemotherapy: Secondary | ICD-10-CM | POA: Diagnosis not present

## 2024-01-18 DIAGNOSIS — Z923 Personal history of irradiation: Secondary | ICD-10-CM | POA: Diagnosis not present

## 2024-01-18 DIAGNOSIS — C023 Malignant neoplasm of anterior two-thirds of tongue, part unspecified: Secondary | ICD-10-CM

## 2024-01-18 DIAGNOSIS — C021 Malignant neoplasm of border of tongue: Secondary | ICD-10-CM | POA: Diagnosis not present

## 2024-01-18 DIAGNOSIS — Z79899 Other long term (current) drug therapy: Secondary | ICD-10-CM | POA: Diagnosis not present

## 2024-01-18 DIAGNOSIS — Z9221 Personal history of antineoplastic chemotherapy: Secondary | ICD-10-CM | POA: Diagnosis not present

## 2024-01-18 DIAGNOSIS — Z510 Encounter for antineoplastic radiation therapy: Secondary | ICD-10-CM | POA: Insufficient documentation

## 2024-01-18 NOTE — Progress Notes (Signed)
 Oncology Nurse Navigator Documentation   To provide support, encouragement and care continuity, met with Mr. Benjamin Choi during his CT SIM. He was accompanied by his wife, Andrez. I answered questions before CT simulation about treatment and scheduling.  He tolerated procedure without difficulty, denied questions/concerns.   I encouraged him to call me prior to 01/27/24 New Start.   [name] RN, BSN, OCN Head & Neck Oncology Nurse Navigator [facility] at [facility] Phone # [phone]  Fax # [phone]

## 2024-01-18 NOTE — Progress Notes (Signed)
 Has armband been applied?  Yes.    Does patient have an allergy to IV contrast dye?: No.   Has patient ever received premedication for IV contrast dye?: No.   Date of lab work: 01/13/2024 BUN: 11 CR: 0.83 eGFR: >60  Does patient take metformin?: No.  Is eGFR >60?: Yes.   If no, when can patient resume? (Must be 48 hrs AFTER they receive IV contrast):    IV site: Right AC   There were no vitals taken for this visit.

## 2024-01-21 ENCOUNTER — Other Ambulatory Visit: Payer: Self-pay

## 2024-01-21 DIAGNOSIS — C023 Malignant neoplasm of anterior two-thirds of tongue, part unspecified: Secondary | ICD-10-CM

## 2024-01-22 ENCOUNTER — Inpatient Hospital Stay

## 2024-01-22 NOTE — Progress Notes (Signed)
 [facility] Clinical Social Work  Initial Assessment   Benjamin Choi is a 33 y.o. year old male contacted by phone. Clinical Social Work was referred by nurse navigator for assessment of psychosocial needs.   SDOH (Social Determinants of Health) assessments performed: Yes   SDOH Screenings   Food Insecurity: No Food Insecurity (01/22/2024)  Housing: Low Risk  (01/22/2024)  Transportation Needs: No Transportation Needs (01/22/2024)  Utilities: Not At Risk (01/22/2024)  Depression (PHQ2-9): Low Risk  (01/13/2024)  Recent Concern: Depression (PHQ2-9) - Medium Risk (11/02/2023)  Financial Resource Strain: Low Risk  (01/22/2024)  Tobacco Use: Low Risk  (01/13/2024)    PHQ 2/9:    01/13/2024    1:16 PM 12/31/2023   11:47 AM 11/02/2023    8:14 AM  Depression screen PHQ 2/9  Decreased Interest 0 0 1  Down, Depressed, Hopeless 0 0 1  PHQ - 2 Score 0 0 2  Altered sleeping   1  Tired, decreased energy   1  Change in appetite   1  Feeling bad or failure about yourself    0  Moving slowly or fidgety/restless   0  Suicidal thoughts   0  PHQ-9 Score   5   Difficult doing work/chores   Somewhat difficult     Data saved with a previous flowsheet row definition     Distress Screen completed: No     No data to display            Family/Social Information:  Housing Arrangement: patient lives with his spouse. Family members/support persons in your life? Benjamin Choi named his spouse and his family as his support system. Transportation concerns: no, Benjamin Choi or his spouse will be driving to appointments  Employment: Working full time but is out on leave since the surgery..  Income source: Employment Financial concerns: No Type of concern: None Food access concerns: no Religious or spiritual practice: No Advanced directives: No  Coping/ Adjustment to diagnosis: Patient understands treatment plan and what happens next? yes Concerns about diagnosis and/or treatment: Benjamin Choi briefly mentioned  feelings of nervousness and desire to get treatment completed. Benjamin Choi understands these are expected emotions. No emotional concerns noted,  Patient reported stressors: No reported stressors. Hopes and/or priorities: For treatment to be completed. Current coping skills/ strengths: Ability for insight , Active sense of humor , Average or above average intelligence , Capable of independent living , Communication skills , Financial means , General fund of knowledge , Motivation for treatment/growth , Physical Health , Supportive family/friends , and Work skills     SUMMARY: Current SDOH Barriers:  No reported SDOH barriers  Clinical Social Work Clinical Goal(s):  No clinical social work goals at this time  Interventions: Discussed common feeling and emotions when being diagnosed with cancer, and the importance of support during treatment Informed patient of the support team roles and support services at [facility]. CSW emailed patient social worker for available programs. Provided CSW contact information and encouraged patient to call with any questions or concerns   Follow Up Plan: CSW will follow up with patient by phone half way through treatment.  Patient verbalizes understanding of plan: Yes  Lizbeth Sprague, LCSW Clinical Social Worker [facility]

## 2024-01-22 NOTE — Progress Notes (Signed)
 Pharmacist Chemotherapy Monitoring - Initial Assessment    Anticipated start date: 01/29/24   The following has been reviewed per standard work regarding the patient's treatment regimen: The patient's diagnosis, treatment plan and drug doses, and organ/hematologic function Lab orders and baseline tests specific to treatment regimen  The treatment plan start date, drug sequencing, and pre-medications Prior authorization status  Patient's documented medication list, including drug-drug interaction screen and prescriptions for anti-emetics and supportive care specific to the treatment regimen The drug concentrations, fluid compatibility, administration routes, and timing of the medications to be used The patient's access for treatment and lifetime cumulative dose history, if applicable  The patient's medication allergies and previous infusion related reactions, if applicable   Changes made to treatment plan:  treatment plan date  Follow up needed:  [facility] placement scheduled 01/26/24  Harlene JONELLE Nasuti, RPH, 01/22/2024  4:10 PM

## 2024-01-25 ENCOUNTER — Inpatient Hospital Stay

## 2024-01-25 ENCOUNTER — Other Ambulatory Visit: Payer: Self-pay | Admitting: Radiology

## 2024-01-25 DIAGNOSIS — C029 Malignant neoplasm of tongue, unspecified: Secondary | ICD-10-CM

## 2024-01-25 NOTE — H&P (Signed)
 Chief Complaint: Invasive squamous cell carcinoma of the right lateral margin of tongue, status post partial glossectomy and right cervical lymph node dissection in September 2025; referred for port a cath placement to assist with treatment  Referring Provider(s): Pasam,A  Supervising Physician: Philip Cornet  Patient Status: [facility] - Out-pt  History of Present Illness: Benjamin Choi is a 33 y.o. male with a past medical history of bipolar disorder, depression  who presents now with recently diagnosed invasive squamous cell carcinoma of the right lateral margin of tongue, status post partial glossectomy and right cervical lymph node dissection in September 2025 . He is scheduled today for port a cath placement to assist with treatment.  *** Patient is Full Code  Past Medical History:  Diagnosis Date   Bipolar disorder (HCC)    Depression     No past surgical history on file.  Allergies: Patient has no known allergies.  Medications: Prior to Admission medications   Medication Sig Start Date End Date Taking? Authorizing Provider  dexamethasone (DECADRON) 4 MG tablet Take 2 tablets (8 mg) by mouth daily x 3 days starting the day after cisplatin chemotherapy. Take with food. 01/13/24   Pasam, Chinita, MD  fexofenadine  (ALLEGRA  ALLERGY) 180 MG tablet Take 1 tablet (180 mg total) by mouth daily. Patient not taking: Reported on 01/13/2024 11/02/23   Wendee Lynwood CHRISTELLA, NP  fluticasone  (FLONASE ) 50 MCG/ACT nasal spray Place 2 sprays into both nostrils daily. 11/02/23   Wendee Lynwood CHRISTELLA, NP  gabapentin (NEURONTIN) 100 MG capsule Take 100 mg by mouth 3 (three) times daily.    [provider]  hydrOXYzine  (ATARAX ) 10 MG tablet Take 1-2 tablets (10-20 mg total) by mouth 3 (three) times daily as needed for anxiety. 11/02/23   Cleatus Arlyss RAMAN, MD  lidocaine -prilocaine (EMLA) cream Apply to affected area once 01/13/24   Pasam, Avinash, MD  magic mouthwash (lidocaine , diphenhydrAMINE, alum & mag  hydroxide) suspension Swish and spit 5 mLs 4 (four) times daily. Patient not taking: Reported on 01/13/2024 11/02/23   Cleatus Arlyss RAMAN, MD  Melatonin 10 MG TABS Take 10 mg by mouth at bedtime.    [provider]  ondansetron (ZOFRAN) 8 MG tablet Take 1 tablet (8 mg total) by mouth every 8 (eight) hours as needed for nausea or vomiting. Start on the third day after cisplatin. 01/13/24   Pasam, Chinita, MD  oxyCODONE (ROXICODONE) 5 MG/5ML solution Take 5 mg by mouth every 6 (six) hours as needed. 12/05/23   [provider]  prochlorperazine (COMPAZINE) 10 MG tablet Take 1 tablet (10 mg total) by mouth every 6 (six) hours as needed (Nausea or vomiting). 01/13/24   Pasam, Chinita, MD     Family History  Problem Relation Age of Onset   Arthritis Mother    Depression Mother    Cancer Paternal Grandmother        skin cancer    Social History   Socioeconomic History   Marital status: Married    Spouse name: Andrez   Number of children: 1   Years of education: Not on file   Highest education level: Not on file  Occupational History   Not on file  Tobacco Use   Smoking status: Never   Smokeless tobacco: Never  Vaping Use   Vaping status: Never Used  Substance and Sexual Activity   Alcohol use: Yes    Comment: socially   Drug use: No   Sexual activity: Yes    Birth control/protection:  None  Other Topics Concern   Not on file  Social History Narrative   Fulltime: Chiropractor (4)   [employer] (7)      Hobbies: play music and dirt bikes    Social Drivers of Health   Financial Resource Strain: Low Risk  (01/22/2024)   Overall Financial Resource Strain (CARDIA)    Difficulty of Paying Living Expenses: Not hard at all  Food Insecurity: No Food Insecurity (01/22/2024)   Hunger Vital Sign    Worried About Running Out of Food in the Last Year: Never true    Ran Out of Food in the Last Year: Never true  Transportation Needs: No Transportation Needs  (01/22/2024)   PRAPARE - Administrator, Civil Service (Medical): No    Lack of Transportation (Non-Medical): No  Physical Activity: Not on file  Stress: Not on file  Social Connections: Not on file       Review of Systems  Vital Signs:   Advance Care Plan: no documents on file  Physical Exam  Imaging: No results found.  Labs:  CBC: Recent Labs    01/13/24 1411  WBC 6.5  HGB 15.8  HCT 45.5  PLT 256    COAGS: No results for input(s): INR, APTT in the last 8760 hours.  BMP: Recent Labs    01/13/24 1411  NA 139  K 4.1  CL 102  CO2 31  GLUCOSE 92  BUN 11  CALCIUM 10.2  CREATININE 0.83  GFRNONAA >60    LIVER FUNCTION TESTS: Recent Labs    01/13/24 1411  BILITOT 0.7  AST 18  ALT 13  ALKPHOS 65  PROT 8.1  ALBUMIN 5.0    TUMOR MARKERS: No results for input(s): AFPTM, CEA, CA199, CHROMGRNA in the last 8760 hours.  Assessment and Plan: 33 y.o. male with a past medical history of bipolar disorder, depression  who presents now with recently diagnosed invasive squamous cell carcinoma of the right lateral margin of tongue, status post partial glossectomy and right cervical lymph node dissection in September 2025 . He is scheduled today for port a cath placement to assist with treatment.Risks and benefits of image guided port-a-catheter placement was discussed with the patient including, but not limited to bleeding, infection, pneumothorax, or fibrin sheath development and need for additional procedures.  All of the patient's questions were answered, patient is agreeable to proceed. Consent signed and in chart.    Thank you for allowing our service to participate in Benjamin Choi 's care.  Electronically Signed: D. Franky Rakers, PA-C   01/25/2024, 1:41 PM      I spent a total of  20 minutes   in face to face in clinical consultation, greater than 50% of which was counseling/coordinating care for port a cath placement

## 2024-01-26 ENCOUNTER — Telehealth: Payer: Self-pay

## 2024-01-26 ENCOUNTER — Other Ambulatory Visit: Payer: Self-pay

## 2024-01-26 ENCOUNTER — Encounter (HOSPITAL_COMMUNITY): Payer: Self-pay

## 2024-01-26 ENCOUNTER — Ambulatory Visit (HOSPITAL_COMMUNITY)
Admission: RE | Admit: 2024-01-26 | Discharge: 2024-01-26 | Disposition: A | Discharge status: Home / Self Care | Source: Ambulatory Visit | Attending: Oncology | Admitting: Oncology

## 2024-01-26 ENCOUNTER — Ambulatory Visit (HOSPITAL_COMMUNITY)
Admission: RE | Admit: 2024-01-26 | Discharge: 2024-01-26 | Disposition: A | Source: Ambulatory Visit | Attending: Oncology

## 2024-01-26 DIAGNOSIS — Z79899 Other long term (current) drug therapy: Secondary | ICD-10-CM | POA: Diagnosis not present

## 2024-01-26 DIAGNOSIS — C021 Malignant neoplasm of border of tongue: Secondary | ICD-10-CM | POA: Diagnosis not present

## 2024-01-26 DIAGNOSIS — F319 Bipolar disorder, unspecified: Secondary | ICD-10-CM | POA: Diagnosis not present

## 2024-01-26 DIAGNOSIS — Z923 Personal history of irradiation: Secondary | ICD-10-CM | POA: Diagnosis not present

## 2024-01-26 DIAGNOSIS — C023 Malignant neoplasm of anterior two-thirds of tongue, part unspecified: Secondary | ICD-10-CM | POA: Diagnosis not present

## 2024-01-26 DIAGNOSIS — C029 Malignant neoplasm of tongue, unspecified: Secondary | ICD-10-CM | POA: Insufficient documentation

## 2024-01-26 DIAGNOSIS — C760 Malignant neoplasm of head, face and neck: Secondary | ICD-10-CM | POA: Diagnosis not present

## 2024-01-26 DIAGNOSIS — Z9221 Personal history of antineoplastic chemotherapy: Secondary | ICD-10-CM | POA: Diagnosis not present

## 2024-01-26 DIAGNOSIS — Z5111 Encounter for antineoplastic chemotherapy: Secondary | ICD-10-CM | POA: Diagnosis not present

## 2024-01-26 HISTORY — PX: IR IMAGING GUIDED PORT INSERTION: IMG5740

## 2024-01-26 MED ORDER — HEPARIN SOD (PORK) LOCK FLUSH 100 UNIT/ML IV SOLN
INTRAVENOUS | Status: AC
  Filled 2024-01-26: qty 5

## 2024-01-26 MED ORDER — LIDOCAINE HCL 1 % IJ SOLN
20.0000 mL | Freq: Once | INTRAMUSCULAR | Status: AC
  Administered 2024-01-26: 10 mL via INTRADERMAL

## 2024-01-26 MED ORDER — MIDAZOLAM HCL 2 MG/2ML IJ SOLN
INTRAMUSCULAR | Status: AC
  Filled 2024-01-26: qty 2

## 2024-01-26 MED ORDER — FENTANYL CITRATE (PF) 100 MCG/2ML IJ SOLN
INTRAMUSCULAR | Status: AC
  Filled 2024-01-26: qty 2

## 2024-01-26 MED ORDER — MIDAZOLAM HCL (PF) 2 MG/2ML IJ SOLN
INTRAMUSCULAR | Status: AC | PRN
  Administered 2024-01-26: 1 mg via INTRAVENOUS

## 2024-01-26 MED ORDER — DIPHENHYDRAMINE HCL 50 MG/ML IJ SOLN
INTRAMUSCULAR | Status: AC | PRN
  Administered 2024-01-26: 50 mg via INTRAVENOUS

## 2024-01-26 MED ORDER — LIDOCAINE-EPINEPHRINE 1 %-1:100000 IJ SOLN
20.0000 mL | Freq: Once | INTRAMUSCULAR | Status: AC
  Administered 2024-01-26: 10 mL via INTRADERMAL

## 2024-01-26 MED ORDER — LIDOCAINE-EPINEPHRINE 1 %-1:100000 IJ SOLN
INTRAMUSCULAR | Status: AC
  Filled 2024-01-26: qty 1

## 2024-01-26 MED ORDER — FENTANYL CITRATE (PF) 100 MCG/2ML IJ SOLN
INTRAMUSCULAR | Status: AC | PRN
  Administered 2024-01-26: 50 ug via INTRAVENOUS

## 2024-01-26 MED ORDER — DIPHENHYDRAMINE HCL 50 MG/ML IJ SOLN
INTRAMUSCULAR | Status: AC
  Filled 2024-01-26: qty 1

## 2024-01-26 MED ORDER — HEPARIN SOD (PORK) LOCK FLUSH 100 UNIT/ML IV SOLN
500.0000 [IU] | Freq: Once | INTRAVENOUS | Status: AC
  Administered 2024-01-26: 500 [IU] via INTRAVENOUS

## 2024-01-26 MED ORDER — MIDAZOLAM HCL 5 MG/5ML IJ SOLN
INTRAMUSCULAR | Status: AC | PRN
  Administered 2024-01-26: 2 mg via INTRAVENOUS

## 2024-01-26 MED ORDER — SODIUM CHLORIDE 0.9 % IV SOLN: INTRAVENOUS | Status: DC

## 2024-01-26 MED ORDER — MIDAZOLAM HCL 5 MG/5ML IJ SOLN
INTRAMUSCULAR | Status: AC | PRN
  Administered 2024-01-26: 1 mg via INTRAVENOUS

## 2024-01-26 MED ORDER — LIDOCAINE HCL 1 % IJ SOLN
INTRAMUSCULAR | Status: AC
  Filled 2024-01-26: qty 20

## 2024-01-26 NOTE — Sedation Documentation (Signed)
 Rn Harden Bramer wasted 1 mg Versed and 50 mcg Fentanyl with Rosina Essex RN.

## 2024-01-26 NOTE — Telephone Encounter (Signed)
 Notified the patient regarding his FMLA/Disability being completed,faxed, and confirmation received. Pt verbalized understanding. He request his copies to be emailed to him.

## 2024-01-26 NOTE — Discharge Instructions (Signed)
 For questions /concerns may call Interventional Radiology at [phone] or  Interventional Radiology clinic [phone]   You may remove your dressing and shower tomorrow afternoon  DO NOT use EMLA cream for 2 weeks after port placement as the cream will remove surgical glue on your incision.                                                                   Implanted Port Insertion, Care After The following information offers guidance on how to care for yourself after your procedure. Your health care provider may also give you more specific instructions. If you have problems or questions, contact your health care provider. What can I expect after the procedure? After the procedure, it is common to have: Discomfort at the port insertion site. Bruising on the skin over the port. This should improve over 3-4 days. Follow these instructions at home: [facility] care After your port is placed, you will get a manufacturer's information card. The card has information about your port. Keep this card with you at all times. Take care of the port as told by your health care provider. Ask your health care provider if you or a family member can get training for taking care of the port at home. A home health care nurse will be be available to help care for the port. Make sure to remember what type of port you have. Incision care  Follow instructions from your health care provider about how to take care of your port insertion site. Make sure you: Wash your hands with soap and water for at least 20 seconds before and after you change your bandage (dressing). If soap and water are not available, use hand sanitizer. Change your dressing as told by your health care provider. Leave stitches (sutures), skin glue, or adhesive strips in place. These skin closures may need to stay in place for 2 weeks or longer. If adhesive strip edges start to loosen and curl up, you may trim the loose edges. Do not remove adhesive  strips completely unless your health care provider tells you to do that. Check your port insertion site every day for signs of infection. Check for: Redness, swelling, or pain. Fluid or blood. Warmth. Pus or a bad smell. Activity Return to your normal activities as told by your health care provider. Ask your health care provider what activities are safe for you. You may have to avoid lifting. Ask your health care provider how much you can safely lift. General instructions Take over-the-counter and prescription medicines only as told by your health care provider. Do not take baths, swim, or use a hot tub until your health care provider approves. Ask your health care provider if you may take showers. You may only be allowed to take sponge baths. If you were given a sedative during the procedure, it can affect you for several hours. Do not drive or operate machinery until your health care provider says that it is safe. Wear a medical alert bracelet in case of an emergency. This will tell any health care providers that you have a port. Keep all follow-up visits. This is important. Contact a health care provider if: You cannot flush your port with saline as directed, or you cannot draw blood  from the port. You have a fever or chills. You have redness, swelling, or pain around your port insertion site. You have fluid or blood coming from your port insertion site. Your port insertion site feels warm to the touch. You have pus or a bad smell coming from the port insertion site. Get help right away if: You have chest pain or shortness of breath. You have bleeding from your port that you cannot control. These symptoms may be an emergency. Get help right away. Call 911. Do not wait to see if the symptoms will go away. Do not drive yourself to the hospital. Summary Take care of the port as told by your health care provider. Keep the manufacturer's information card with you at all times. Change your  dressing as told by your health care provider. Contact a health care provider if you have a fever or chills or if you have redness, swelling, or pain around your port insertion site. Keep all follow-up visits. This information is not intended to replace advice given to you by your health care provider. Make sure you discuss any questions you have with your health care provider. Document Revised: 08/28/2020 Document Reviewed: 08/28/2020 Elsevier Patient Education  2024 Elsevier Inc.                                                         Moderate Conscious Sedation, Adult, Care After After the procedure, it is common to have: Sleepiness for a few hours. Impaired judgment for a few hours. Trouble with balance. Nausea or vomiting if you eat too soon. Follow these instructions at home: For the time period you were told by your health care provider:  Rest. Do not participate in activities where you could fall or become injured. Do not drive or use machinery. Do not drink alcohol. Do not take sleeping pills or medicines that cause drowsiness. Do not make important decisions or sign legal documents. Do not take care of children on your own. Eating and drinking Follow instructions from your health care provider about what you may eat and drink. Drink enough fluid to keep your urine pale yellow. If you vomit: Drink clear fluids slowly and in small amounts as you are able. Clear fluids include water, ice chips, low-calorie sports drinks, and fruit juice that has water added to it (diluted fruit juice). Eat light and bland foods in small amounts as you are able. These foods include bananas, applesauce, rice, lean meats, toast, and crackers. General instructions Take over-the-counter and prescription medicines only as told by your health care provider. Have a responsible adult stay with you for the time you are told. Do not use any products that contain nicotine or tobacco. These products  include cigarettes, chewing tobacco, and vaping devices, such as e-cigarettes. If you need help quitting, ask your health care provider. Return to your normal activities as told by your health care provider. Ask your health care provider what activities are safe for you. Your health care provider may give you more instructions. Make sure you know what you can and cannot do. Contact a health care provider if: You are still sleepy or having trouble with balance after 24 hours. You feel light-headed. You vomit every time you eat or drink. You get a rash. You have a fever. You have redness  or swelling around the IV site. Get help right away if: You have trouble breathing. You start to feel confused at home. These symptoms may be an emergency. Get help right away. Call 911. Do not wait to see if the symptoms will go away. Do not drive yourself to the hospital. This information is not intended to replace advice given to you by your health care provider. Make sure you discuss any questions you have with your health care provider. Document Revised: 09/09/2021 Document Reviewed: 09/09/2021 Elsevier Patient Education  2024 [employer].

## 2024-01-27 ENCOUNTER — Inpatient Hospital Stay

## 2024-01-27 ENCOUNTER — Other Ambulatory Visit: Payer: Self-pay

## 2024-01-27 ENCOUNTER — Ambulatory Visit
Admission: RE | Admit: 2024-01-27 | Discharge: 2024-01-27 | Disposition: A | Source: Ambulatory Visit | Attending: Radiation Oncology | Admitting: Radiation Oncology

## 2024-01-27 DIAGNOSIS — Z9221 Personal history of antineoplastic chemotherapy: Secondary | ICD-10-CM | POA: Diagnosis not present

## 2024-01-27 DIAGNOSIS — Z79899 Other long term (current) drug therapy: Secondary | ICD-10-CM | POA: Diagnosis not present

## 2024-01-27 DIAGNOSIS — C021 Malignant neoplasm of border of tongue: Secondary | ICD-10-CM | POA: Diagnosis not present

## 2024-01-27 DIAGNOSIS — C023 Malignant neoplasm of anterior two-thirds of tongue, part unspecified: Secondary | ICD-10-CM | POA: Diagnosis not present

## 2024-01-27 DIAGNOSIS — Z923 Personal history of irradiation: Secondary | ICD-10-CM | POA: Diagnosis not present

## 2024-01-27 DIAGNOSIS — Z5111 Encounter for antineoplastic chemotherapy: Secondary | ICD-10-CM | POA: Diagnosis not present

## 2024-01-27 LAB — RAD ONC ARIA SESSION SUMMARY
Course Elapsed Days: 0
Plan Fractions Treated to Date: 1
Plan Prescribed Dose Per Fraction: 2 Gy
Plan Total Fractions Prescribed: 33
Plan Total Prescribed Dose: 66 Gy
Reference Point Dosage Given to Date: 2 Gy
Reference Point Session Dosage Given: 2 Gy
Session Number: 1

## 2024-01-27 NOTE — Progress Notes (Signed)
 [facility] Psychosocial Distress Screening Clinical Social Work  Benjamin Choi is a 33 y.o. year old male. Clinical Social Work was referred by distress screen protocol for positive distress screening. The patient scored a 9 on the Psychosocial Distress Thermometer which indicates severe distress. Clinical Social Worker contacted patient by phone to assess for distress and other psychosocial needs.     Distress Screen:    01/25/2024   10:56 AM  ONCBCN DISTRESS SCREENING  Screening Type Initial Screening  How much distress have you been experiencing in the past week? (0-10) 9  Practical concerns type Taking care of myself;Taking care of others  Emotional concerns type Worry or anxiety;Changes in appearance  Physical Concerns Type  Pain;Sleep;Changes in eating;Loss or change of physical abilities     Interventions:  CSW and patient discussed common feeling and emotions when being diagnosed with cancer, and the importance of support during treatment.  CSW informed patient of the support team and support services at [facility].  CSW provided contact information and encouraged patient to call with any questions or concerns. Patient attributed the distress to treatment nervousness. CSW placed referral for an [employer]. Patient understands he can decline peer mentor if needed when contact is made. Patient understands CSW will make contact in approx 2 weeks. Direct contact is below if needed.  Follow Up Plan: CSW will follow-up with patient by phone  Patient verbalizes understanding of plan: Yes    Lizbeth Sprague, LCSW  [phone]

## 2024-01-27 NOTE — Progress Notes (Signed)
 Oncology Nurse Navigator Documentation   To provide support, encouragement and care continuity, met with Benjamin Choi for his initial RT.  He was accompanied by his wife. Benjamin Choi completed treatment without difficulty, denied questions/concerns. I reviewed the registration/arrival procedure for subsequent treatments. I discussed his appointments with SLP and PT tomorrow starting at 10:00. They know to meet me in the radiation waiting room.  I encouraged them to call me with questions/concerns as treatments proceed.   Benjamin Jefferson RN, BSN, OCN Head & Neck Oncology Nurse Navigator [facility] Phone # [phone]  Fax # [phone]

## 2024-01-28 ENCOUNTER — Ambulatory Visit: Attending: Radiation Oncology

## 2024-01-28 ENCOUNTER — Other Ambulatory Visit: Payer: Self-pay

## 2024-01-28 ENCOUNTER — Ambulatory Visit
Admission: RE | Admit: 2024-01-28 | Discharge: 2024-01-28 | Disposition: A | Source: Ambulatory Visit | Attending: Radiation Oncology

## 2024-01-28 ENCOUNTER — Ambulatory Visit: Attending: Radiation Oncology | Admitting: Physical Therapy

## 2024-01-28 ENCOUNTER — Encounter: Payer: Self-pay | Admitting: Physical Therapy

## 2024-01-28 DIAGNOSIS — M25511 Pain in right shoulder: Secondary | ICD-10-CM | POA: Diagnosis not present

## 2024-01-28 DIAGNOSIS — R1311 Dysphagia, oral phase: Secondary | ICD-10-CM | POA: Insufficient documentation

## 2024-01-28 DIAGNOSIS — C021 Malignant neoplasm of border of tongue: Secondary | ICD-10-CM | POA: Diagnosis not present

## 2024-01-28 DIAGNOSIS — Z9221 Personal history of antineoplastic chemotherapy: Secondary | ICD-10-CM | POA: Diagnosis not present

## 2024-01-28 DIAGNOSIS — M25611 Stiffness of right shoulder, not elsewhere classified: Secondary | ICD-10-CM | POA: Insufficient documentation

## 2024-01-28 DIAGNOSIS — R293 Abnormal posture: Secondary | ICD-10-CM | POA: Insufficient documentation

## 2024-01-28 DIAGNOSIS — C023 Malignant neoplasm of anterior two-thirds of tongue, part unspecified: Secondary | ICD-10-CM | POA: Insufficient documentation

## 2024-01-28 DIAGNOSIS — Z79899 Other long term (current) drug therapy: Secondary | ICD-10-CM | POA: Diagnosis not present

## 2024-01-28 DIAGNOSIS — Z5111 Encounter for antineoplastic chemotherapy: Secondary | ICD-10-CM | POA: Diagnosis not present

## 2024-01-28 DIAGNOSIS — Z923 Personal history of irradiation: Secondary | ICD-10-CM | POA: Diagnosis not present

## 2024-01-28 LAB — RAD ONC ARIA SESSION SUMMARY
Course Elapsed Days: 1
Plan Fractions Treated to Date: 2
Plan Prescribed Dose Per Fraction: 2 Gy
Plan Total Fractions Prescribed: 33
Plan Total Prescribed Dose: 66 Gy
Reference Point Dosage Given to Date: 4 Gy
Reference Point Session Dosage Given: 2 Gy
Session Number: 2

## 2024-01-28 MED FILL — Fosaprepitant Dimeglumine For IV Infusion 150 MG (Base Eq): INTRAVENOUS | Qty: 5 | Status: AC

## 2024-01-28 NOTE — Therapy (Signed)
 OUTPATIENT PHYSICAL THERAPY HEAD AND NECK BASELINE EVALUATION   Patient Name: Benjamin Choi MRN: [id number] DOB:[birth year], 33 y.o., male Today's Date: 01/28/2024  END OF SESSION:  PT End of Session - 01/28/24 1042     Visit Number 1    Number of Visits 7    Date for Recertification  03/30/24    PT Start Time 1004    PT Stop Time 1040    PT Time Calculation (min) 36 min    Activity Tolerance Patient tolerated treatment well    Behavior During Therapy [facility] for tasks assessed/performed          Past Medical History:  Diagnosis Date   Bipolar disorder (HCC)    Depression    Past Surgical History:  Procedure Laterality Date   IR IMAGING GUIDED PORT INSERTION  01/26/2024   Patient Active Problem List   Diagnosis Date Noted   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) 01/03/2024   Anxiety 11/02/2023   Tongue cancer (HCC) 11/02/2023   Radicular pain in left arm 04/22/2023   Allergic rhinitis 04/22/2023   Tongue lesion 02/16/2023   Puncture wound 12/15/2022   Screening for lipid disorders 06/24/2022   Preventative health care 06/24/2022   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   Depression     PCP: Lynwood Crandall, NP  REFERRING PROVIDER: Izell Domino, MD  REFERRING DIAG: C02.3 (ICD-10-CM) - 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 He presented to his PCP in Dec 2024 with a painful sore on his  right lateral tongue. He was given triamcinolone  paste without relief and returned May 2025 with persistent pain from the lesion and tried magic mouthwash again without relief. He saw a dentist who shaved down a sharp molar to prevent irritation to his tongue. Given persistent pain to the tongue lesion a biopsy was done 10/27/23 which showed findings consistent with keratinizing invasive SCC. He was referred to [facility]. 11/10/23 Consult with Dr. Sallyann. Exam at that time confirmed a 2 cm tender ulcerated right lateral tongue mass. Laryngoscopy negative and neck negative for cervical lymphadenopathy. 11/22/13 CT neck/chest at [facility] which collectively demonstrated: no definite evidence of invasive of local invasion within the soft tissues of the neck, with bilateral submental and jugular chain lymph nodes measure up to 7 mm in thickness, within normal limits. CT findings were also negative for metastatic disease within the chest.12/03/23 he opted to proceed with a partial glossectomy with right cervical lymph node dissections on 12/03/23. Pathology from the procedure showed: tumor the size of 1.3 cm; histology of moderately differentiated invasive squamous cell carcinoma of the tongue (keratinizing) invading a depth of 0.8 cm; positive for focal LVI and intratumoral PNI; Margin positive, nodal status of 29/29 excised lymph nodes negative for carcinoma. He will receive 33 fractions of radiation to his right tongue and right neck with weekly chemotherapy which started on 01/27/24 and will complete 03/17/24.  PATIENT GOALS:   to be educated  about the signs and symptoms of lymphedema and learn post op HEP.   PAIN:  Are you having pain? Yes: NPRS scale: 5/10 Pain location: R shoulder Pain description: electric shock type pain Aggravating factors: certain postures Relieving factors: gabapentin  PRECAUTIONS: Active CA  RED FLAGS: None   WEIGHT BEARING RESTRICTIONS: No  FALLS:  Has patient fallen in last 6  months? No Does the patient have a fear of falling that limits activity? No Is the patient reluctant to leave the house due to a fear of falling?No  LIVING ENVIRONMENT: Patient lives with: wife and kids aged 59 and 7 Lives in: House/apartment Has following equipment at home: None  OCCUPATION: on medical leave- retail banker   LEISURE: outside in yard daily, rides bikes (dirt bikes, mountain bikes), plays ball with kids  PRIOR LEVEL OF FUNCTION: Independent   OBJECTIVE: Note: Objective measures were completed at Evaluation unless otherwise noted.  COGNITION: Overall cognitive status: Within functional limits for tasks assessed                  POSTURE:  Forward head and rounded shoulders posture  30 SEC SIT TO STAND: 20 reps in 30 sec without use of UEs which is  Poor for patient's age. Pt reports he felt he could have completed more.   SHOULDER AROM:   Impaired  R shoulder limited post neck dissection surgery - will measure at next session   CERVICAL AROM:   Percent limited  Flexion WFL  Extension WFL  Right lateral flexion 25% limited  Left lateral flexion 35% limited  Right rotation WFL  Left rotation WFL    (Blank rows=not tested)   PATIENT EDUCATION:  Education details: Neck ROM, importance of posture when sitting, standing and lying down, deep breathing, walking program and importance of staying active throughout treatment, CURE article on staying active, Why exercise? flyer, lymphedema and PT info Person educated: Patient Education method: Explanation, Demonstration, Handout Education comprehension: Patient verbalized understanding and returned demonstration  HOME EXERCISE PROGRAM: Patient was instructed today in a home exercise program today for head and neck range of motion exercises. These included active cervical flexion, active cervical extension, active cervical rotation to each direction, upper trap stretch, and shoulder retraction. Patient was  encouraged to do these 2-3 times a day, holding for 5 sec each and completing for 5 reps. Pt was educated that once this becomes easier then hold the stretches for 30-60 seconds.    ASSESSMENT:  CLINICAL IMPRESSION: Pt arrives to PT with recently diagnosed tongue cancer. He will receive 33 fractions of radiation to his right tongue and right neck with weekly chemotherapy which started on 01/27/24 and will complete 03/17/24. Pt's cervical ROM was limited due to tightness at neck scar. His R shoulder ROM has been limited and he has had increased pain after his neck surgery. Educated pt about signs and symptoms of lymphedema as well as anatomy and physiology of lymphatic system. Educated pt in importance of staying as active as possible throughout treatment to decrease fatigue as well as head and neck ROM exercises to decrease loss of ROM. Pt would benefit from skilled PT services to improve cervical ROM, decrease R shoulder pain and improve ROM. Pt wants to hold off on therapy until Dec 10th and prefers to come once a week due to his other appointments.   Pt will benefit from skilled therapeutic intervention to improve on the following deficits: Decreased knowledge of precautions and postural dysfunction. Other deficits: decreased  ROM, decreased strength, increased fascial restrictions, impaired UE functional use, postural dysfunction, and pain  PT treatment/interventions: ADL/self-care home management, pt/family education, therapeutic exercise. Other interventions [id number]- PT Re-evaluation, 97110-Therapeutic exercises, 97530- Therapeutic activity, [id number]- Neuromuscular re-education, 97535- Self Care, [id number]- Manual therapy, 97760- Orthotic Initial, [phone]- Orthotic/Prosthetic subsequent, and Patient/Family education  REHAB POTENTIAL: Good  CLINICAL DECISION MAKING: Evolving/moderate complexity  EVALUATION COMPLEXITY: Moderate   GOALS: Goals reviewed with patient? YES  LONG TERM GOALS: (STG=LTG)    Name Target Date  Goal status  1 Patient will be able to verbalize understanding of a home exercise program for cervical range of motion, posture, and walking.   Baseline:  No knowledge 01/28/2024 Achieved at eval  2 Patient will be able to verbalize understanding of proper sitting and standing posture. Baseline:  No knowledge 01/28/2024 Achieved at eval  3 Patient will be able to verbalize understanding of lymphedema risk and availability of treatment for this condition Baseline:  No knowledge 01/28/2024 Achieved at eval  4 Pt will demonstrate a return to full cervical ROM and function post operatively compared to baselines and not demonstrate any signs or symptoms of lymphedema.  Baseline: See objective measurements taken today. 03/30/24 New  5 Pt will demonstrate 150 degrees of L shoulder flexion to allow him to reach overhead.  03/30/24 NEW  6 Pt will demonstrate 150 degrees of L shoulder abduction to allow him to reach out to the side. 03/30/24 NEW   7 Pt will report a 50% improvement in R shoulder pain to allow improved comfort and function.   03/30/24 NEW  8 Pt will be independent in a home exercise program for continued stretching and strengtheing.   03/30/24 NEW    PLAN:  PT FREQUENCY/DURATION: 1x/wk for 6 weeks starting 02/17/24  PLAN FOR NEXT SESSION: measure R shoulder ROM, begin instruction on proper gleno humeral rhythm, pulleys, ball, side lying exercises, scap mobs    Physical Therapy Information for During and After Head/Neck Cancer Treatment: Lymphedema is a swelling condition that you may be at risk for in your neck and/or face if you have radiation treatment to the area and/or if you have surgery that includes removing lymph nodes.  There is treatment available for this condition and it is not life-threatening.  Contact your physician or physical therapist with concerns. An excellent resource for those seeking information on lymphedema is the National Lymphedema Network's  website.  It can be accessed at www.lymphnet.org If you notice swelling in your neck or face at any time following surgery (even if it is many years from now), please contact your doctor or physical therapist to discuss this.  Lymphedema can be treated at any time but it is easier for you if it is treated early on. If you have had surgery to your neck, please check with your surgeon about how soon to start doing neck range of motion exercises.  If you are not having surgery, I encourage you to start doing neck range of motion exercises today and continue these while undergoing treatment, UNLESS you have irritation of your skin or soft tissue that is aggravated by doing them.  These exercises are intended to help you prevent loss of range of motion and/or to gain range of motion in your neck (which can be limited by tightening effects of radiation), and NOT to aggravate these tissues if they develop sensitivities from treatment. Neck range of motion exercises should be done to the point of feeling a GENTLE, TOLERABLE stretch only.  You are encouraged to start a walking or other exercise program tomorrow and continue this as much as you are able through and after treatment.  Please feel free to call me with any questions. Florina Lanis Carbon, PT, CLT Physical Therapist and Certified Lymphedema Therapist [facility] [address], [address], [place], [place] [address] [phone] [email or web address]  WALKING  Walking is a great form of exercise to increase your strength, endurance and overall fitness.  A walking program can help you start slowly and gradually build endurance as you go.  Everyone's ability is different, so each person's starting point will be different.  You do not have to follow them exactly.  The are just samples. You should simply find out what's right for you and stick to that program.   In the beginning, you'll start off walking 2-3 times a day  for short distances.  As you get stronger, you'll be walking further at just 1-2 times per day.  A. You Can Walk For A Certain Length Of Time Each Day    Walk 5 minutes 3 times per day.  Increase 2 minutes every 2 days (3 times per day).  Work up to 25-30 minutes (1-2 times per day).   Example:   Day 1-2 5 minutes 3 times per day   Day 7-8 12 minutes 2-3 times per day   Day 13-14 25 minutes 1-2 times per day  B. You Can Walk For a Certain Distance Each Day     Distance can be substituted for time.    Example:   3 trips to mailbox (at road)   3 trips to corner of block   3 trips around the block  C. Go to local high school and use the track.    Walk for distance ____ around track  Or time ____ minutes  D. Walk ____ Jog ____ Run ___   Why exercise?  So many benefits! Here are SOME of them: Heart health, including raising your good cholesterol level and reducing heart rate and blood pressure Lung health, including improved lung capacity It burns fats, and most of us  can stand to be leaner, whether or not we are overweight. It increases the body's natural painkillers and mood elevators, so makes you feel better. Not only makes you feel better, but look better too Improves sleep Takes a bite out of stress May decrease your risk of many types of cancer If you are currently undergoing cancer treatment, exercise may improve your ability to tolerate treatments including chemotherapy. For everybody, it can improve your energy level. Those with cancer-related fatigue report a 40-50% reduction in this symptom when exercising regularly. If you are a survivor of breast, colon, or prostate cancer, it may decrease your risk of a recurrence. (This may hold for other cancers too, but so far we have data just for these three types.)  How to exercise: Get your doctor's okay. Pick something you enjoy doing, like walking, Zumba, biking, swimming, or whatever. Start at low intensity and  time, then gradually increase.  (See walking program handout.) Set a goal to achieve over time.  The American Cancer Society, American Heart Association, and U.S. Dept. of Health and Human Services recommend 150 minutes of moderate exercise, 75 minutes of vigorous exercise, or a combination of both per week. This should be done in episodes at least 10 minutes long, spread throughout the week.  Need help being motivated? Pick something you enjoy doing, because you'll be more inclined  to stick with that activity than something that feels like a chore. Do it with a friend so that you are accountable to each other. Schedule it into your day. Place it on your calendar and keep that appointment just like you do any appointment that you make. Join an exercise group that meets at a specific time.  That way, you have to show up on time, and that makes it harder to procrastinate about doing your workout.  It also keeps you accountable--people begin to expect you to be there. Join a gym where you feel comfortable and not intimidated, at the right cost. Sign up for something that you'll need to be in shape for on a specific date, like a 1K or a 5K to walk or run, a 20 or 30 mile bike ride, a mud run or something like that. If the date is looming, you know you'll need to train to be ready for it.  An added benefit is that many of these are fundraisers for good causes. If you've already paid for a gym membership, group exercise class or event, you might as well work out, so you haven't wasted your money!    [facility] [place], PT 01/28/2024, 10:52 AM

## 2024-01-28 NOTE — Patient Instructions (Signed)
 SWALLOWING EXERCISES Do these 5-6 days/week until 6 months after your last day of radiation, then 2 days per week afterwards You can use 1-2 drops of liquid to help you swallow, if your mouth gets dry  Effortful Swallows - Press your tongue against the roof of your mouth for 3 seconds, then swallow as hard as you can - Do at least 20 reps/day, in sets of 5-10  Masako Swallow - swallow with your tongue sticking out - Stick tongue out past your lips and gently bite tongue with your teeth - Swallow, while holding your tongue with your teeth - Do at least 20 reps/day, in sets of 5-10   Shaker Exercise - head lift - Lie flat on your back in your bed, the floor, or a couch  - Raise your head and look at your feet - KEEP YOUR SHOULDERS DOWN - HOLD FOR 45-60 SECONDS, then lower your head back down - Repeat 3 times, 2-3 times a day  [employer] - squeeze swallow exercise - Swallow, and squeeze tight to keep your Adam's Apple up - Hold the squeeze for 5-7 seconds - then relax - Do at least 20 reps/day, in sets of 5-10  5.   Chin tuck - Place a rolled up towel (4 inches wide) under your chin near your neck - Tuck your chin and push hard on the towel for 5 seconds - Do at least 20 reps/day, in sets of 5-10      6.  Tongue stretch - sweep your tongue in the pocket between your cheek and gums - top and bottom - 10 times each direction

## 2024-01-28 NOTE — Therapy (Signed)
 OUTPATIENT SPEECH LANGUAGE PATHOLOGY ONCOLOGY EVALUATION   Patient Name: Benjamin Choi MRN: [id number] DOB:Aug 26, 1990, 33 y.o., male Today's Date: 01/28/2024  PCP: Wendee Agent, NP REFERRING PROVIDER: Izell Domino, MD  END OF SESSION:  End of Session - 01/28/24 1152     Visit Number 1    Number of Visits 7    Date for Recertification  04/27/24    SLP Start Time 1110    SLP Stop Time  1150    SLP Time Calculation (min) 40 min    Activity Tolerance Patient tolerated treatment well          Past Medical History:  Diagnosis Date   Bipolar disorder (HCC)    Depression    Past Surgical History:  Procedure Laterality Date   IR IMAGING GUIDED PORT INSERTION  01/26/2024   Patient Active Problem List   Diagnosis Date Noted   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) 01/03/2024   Anxiety 11/02/2023   Tongue cancer (HCC) 11/02/2023   Radicular pain in left arm 04/22/2023   Allergic rhinitis 04/22/2023   Tongue lesion 02/16/2023   Puncture wound 12/15/2022   Screening for lipid disorders 06/24/2022   Preventative health care 06/24/2022   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 He presented to his PCP in Dec 2024 with a painful sore on his right lateral tongue. He was given triamcinolone  paste without relief and returned May 2025 with persistent pain from the lesion and tried magic  mouthwash again without relief. He saw a dentist who shaved down a sharp molar to prevent irritation to his tongue. Given persistent pain to the tongue lesion a biopsy was done 10/27/23 which showed findings consistent with keratinizing invasive SCC. He was referred to [facility]. 11/10/23 Consult with Dr. Sallyann. Exam at that time confirmed a 2 cm tender ulcerated right lateral tongue mass. Laryngoscopy negative and neck negative for cervical lymphadenopathy. 11/22/13 CT neck/chest at [facility] which collectively demonstrated: no definite evidence of invasive of local invasion within the soft tissues of the neck, with bilateral submental and jugular chain lymph nodes measure up to 7 mm in thickness, within normal limits. CT findings were also negative for metastatic disease within the chest. 12/03/23 he opted to proceed with a partial glossectomy with right cervical lymph node dissections on 12/03/23. Pathology from the procedure showed: tumor the size of 1.3 cm; histology of moderately differentiated invasive squamous cell carcinoma of the tongue (keratinizing) invading a depth of 0.8 cm; positive for focal LVI and intratumoral PNI; Margin positive, nodal status of 29/29 excised lymph nodes negative for carcinoma. 01/01/24 Consult with Dr. Izell, 01/07/24 Dental clearance, 01/13/24 Consult with Dr. Autumn. He will receive chemotherapy with radiation. Treatment plan: He will receive 33 fractions of radiation to his right tongue and right neck with weekly chemotherapy which started on 01/27/24 and will complete 03/17/24.Pretreatment procedures: 12/03/23 Partial Glossectomy with right cervical lymph node dissection at  [facility]. 01/26/24 PAC, he has declined PEG at this time and will be scheduled 4-5 weeks into treatment.    PAIN:  Are you having pain? Yes: NPRS scale: 5/10 Pain location: R shoulder Pain description: electric shock type pain Aggravating factors: certain postures Relieving factors: gabapentin  FALLS: Has patient  fallen in last 6 months?  No  LIVING ENVIRONMENT: Lives with: lives with their family Lives in: House/apartment  PLOF:  Level of assistance: Independent with ADLs, Independent with IADLs Employment: Full-time employment  PATIENT GOALS: Maintain WNL swallowing  OBJECTIVE:  Note: Objective measures were completed at Evaluation unless otherwise noted. DIAGNOSTIC FINDINGS: seepertinent history  INSTRUMENTAL SWALLOW STUDY FINDINGS (MBSS) none in chart  COGNITION: Overall cognitive status: Within functional limits for tasks assessed  LANGUAGE: Receptive and Expressive language appeared WNL.  ORAL MOTOR EXAMINATION: Overall status: Impaired: Lingual: Right (ROM, Symmetry, Sensation, and Coordination) Comments: Appreciate the surgical changes - pt's ROM was decr'd to lt due to changes surgically.   MOTOR SPEECH: Overall motor speech: Appears intact when pt speaks slower, he reports when he talks faster it will distort /r/ and /s/.  Respiration: thoracic breathing and diaphragmatic/abdominal breathing Phonation: normal Resonance: WFL Articulation: Appears intact Intelligibility: Intelligible Effective technique: slow rate  SUBJECTIVE DYSPHAGIA REPORTS:  Date of onset: after surgery Reported symptoms: cannot clear labial sulcus on rt, currently  Current diet: regular and thin liquids  Co-morbid voice changes: No  FACTORS WHICH MAY INCREASE RISK OF ADVERSE EVENT IN PRESENCE OF ASPIRATION:  General health: well appearing  Risk factors: tube present (not currently) ; pt undergoing ChRT for active cancer  CLINICAL SWALLOW ASSESSMENT:   Dentition: adequate natural dentition Vocal quality at baseline: normal Patient directly observed with POs: Yes: dysphagia 3 (soft) and thin liquids  Feeding: able to feed self Liquids provided by: cup Oral phase signs and symptoms: prolonged mastication and prolonged bolus formation Pharyngeal phase signs and symptoms: none noted                                                                                                                              TREATMENT DATE:   01/28/24: Research states the risk for dysphagia increases due to radiation and/or chemotherapy treatment due to a variety of factors, so SLP educated the pt about the possibility of reduced/limited ability for PO intake during rad tx. SLP also educated pt regarding possible changes to swallowing musculature after rad tx, and why adherence to dysphagia HEP provided today and PO consumption was necessary to inhibit muscle fibrosis following rad tx and to mitigate muscle disuse atrophy. SLP informed pt why this would be detrimental to their swallowing status and to their pulmonary health. Pt demonstrated understanding of these things to SLP. SLP encouraged pt to safely eat and drink as deep into their radiation/chemotherapy as possible to provide the best possible long-term swallowing outcome for pt.  SLP then developed an individualized HEP for pt involving oral and pharyngeal strengthening  and ROM and pt was instructed how to perform these exercises, including SLP demonstration. After SLP demonstration, pt return demonstrated each exercise. SLP ensured pt performance was correct prior to educating pt on next exercise. Pt required occasional min cues faded to modified independent to perform HEP. Pt was instructed to complete this program 6-7 days/week, at least 2 times a day until 6 months after his or her last day of rad tx, and then x2 a week after that, indefinitely. Among other modifications for days when pt cannot functionally swallow, SLP also suggested pt to perform only non-swallowing tasks on the handout/HEP, and if necessary to cycle through the swallowing portion so the full program of exercises can be completed instead of fatiguing on one of the swallowing exercises and being unable to perform the other swallowing exercises. SLP instructed that swallowing exercises should  then be added back into the regimen as pt is able to do so. Secondly, pt was told that former patients have told SLP that during their course of radiation therapy, taking prescribed pain medication just prior to performing HEP (and eating/drinking) has proven helpful in completing HEP (and eating and drinking) more regularly when going through their course of radiation treatment.    PATIENT EDUCATION: Education details: late effects head/neck radiation on swallow function, HEP procedure, and modification to HEP when difficulty experienced with swallowing during and after radiation course Person educated: Patient and Spouse Education method: Explanation, Demonstration, Verbal cues, and Handouts Education comprehension: verbalized understanding, returned demonstration, verbal cues required, and needs further education   ASSESSMENT:  CLINICAL IMPRESSION: Patient is a 33 y.o. M who was seen today for assessment of swallowing as they undergo radiation/chemoradiation therapy. Today pt ate turkey sandwich and drank thin liquids without overt s/s pharyngeal difficulty. Pt took extra time masticating and manipulating bolus, however this was functional for pt. At this time pt swallowing is deemed [facility] with these POs. No oral or overt s/sx pharyngeal deficits, including aspiration were observed. There are no overt s/s aspiration PNA observed by SLP nor any reported by pt at this time. Data indicate that pt's swallow ability will likely decrease over the course of radiation/chemoradiation therapy and could very well decline over time following the conclusion of that therapy due to muscle disuse atrophy and/or muscle fibrosis. Pt will need to be seen by SLP in order to assess safety of PO intake, assess the need for recommending any objective swallow assessment, and ensuring pt is correctly completing the individualized HEP.  OBJECTIVE IMPAIRMENTS: include dysphagia. These impairments are limiting patient from  safety when swallowing. Factors affecting potential to achieve goals and functional outcome are none noted today. Patient will benefit from skilled SLP services to address above impairments and improve overall function.   REHAB POTENTIAL: Good     GOALS: Goals reviewed with patient? No   SHORT TERM GOALS: Target: 3rd total session   1. Pt will complete HEP with modified independence in 2 sessions Baseline: Goal status: INITIAL   2.  pt will tell SLP why pt is completing HEP with modified independence Baseline:  Goal status: INITIAL   3.  pt will describe 3 overt s/s aspiration PNA with modified independence Baseline:  Goal status: INITIAL   4.  pt will demo knowledge of how a food journal could hasten return to a more normalized diet Baseline:  Goal status: INITIAL     LONG TERM GOALS: Target: 7th total session   1.  pt will complete HEP with independence  over two visits Baseline:  Goal status: INITIAL   2.  pt will describe how to modify HEP over time, and the timeline associated with reduction in HEP frequency with modified independence over two sessions Baseline:  Goal status: INITIAL     PLAN:   SLP FREQUENCY:  once approx every 4 weeks   SLP DURATION:  7 sessions   PLANNED INTERVENTIONS: Aspiration precaution training, Pharyngeal strengthening exercises, Diet toleration management , Trials of upgraded texture/liquids, SLP instruction and feedback, Compensatory strategies, and Patient/family education, [phone] (treatment of swallowing dysfunction and/or oral function for feeding)   Felica Chargois, CCC-SLP 01/28/2024, 11:57 AM

## 2024-01-29 ENCOUNTER — Inpatient Hospital Stay: Admitting: Oncology

## 2024-01-29 ENCOUNTER — Other Ambulatory Visit: Payer: Self-pay

## 2024-01-29 ENCOUNTER — Inpatient Hospital Stay

## 2024-01-29 ENCOUNTER — Ambulatory Visit
Admission: RE | Admit: 2024-01-29 | Discharge: 2024-01-29 | Disposition: A | Discharge status: Home / Self Care | Source: Ambulatory Visit | Attending: Radiation Oncology | Admitting: Radiation Oncology

## 2024-01-29 ENCOUNTER — Inpatient Hospital Stay: Admitting: Dietician

## 2024-01-29 ENCOUNTER — Encounter: Payer: Self-pay | Admitting: Oncology

## 2024-01-29 VITALS — BP 114/72 | HR 67 | Temp 98.2°F | Resp 17 | Wt 158.0 lb

## 2024-01-29 DIAGNOSIS — C023 Malignant neoplasm of anterior two-thirds of tongue, part unspecified: Secondary | ICD-10-CM

## 2024-01-29 DIAGNOSIS — Z9221 Personal history of antineoplastic chemotherapy: Secondary | ICD-10-CM | POA: Diagnosis not present

## 2024-01-29 DIAGNOSIS — C021 Malignant neoplasm of border of tongue: Secondary | ICD-10-CM | POA: Diagnosis not present

## 2024-01-29 DIAGNOSIS — Z5111 Encounter for antineoplastic chemotherapy: Secondary | ICD-10-CM | POA: Diagnosis not present

## 2024-01-29 DIAGNOSIS — Z923 Personal history of irradiation: Secondary | ICD-10-CM | POA: Diagnosis not present

## 2024-01-29 DIAGNOSIS — Z79899 Other long term (current) drug therapy: Secondary | ICD-10-CM | POA: Diagnosis not present

## 2024-01-29 LAB — RAD ONC ARIA SESSION SUMMARY
Course Elapsed Days: 2
Plan Fractions Treated to Date: 3
Plan Prescribed Dose Per Fraction: 2 Gy
Plan Total Fractions Prescribed: 33
Plan Total Prescribed Dose: 66 Gy
Reference Point Dosage Given to Date: 6 Gy
Reference Point Session Dosage Given: 2 Gy
Session Number: 3

## 2024-01-29 LAB — CBC WITH DIFFERENTIAL (CANCER CENTER ONLY)
Abs Immature Granulocytes: 0 K/uL (ref 0.00–0.07)
Basophils Absolute: 0 K/uL (ref 0.0–0.1)
Basophils Relative: 1 %
Eosinophils Absolute: 0.1 K/uL (ref 0.0–0.5)
Eosinophils Relative: 3 %
HCT: 43.2 % (ref 39.0–52.0)
Hemoglobin: 14.7 g/dL (ref 13.0–17.0)
Immature Granulocytes: 0 %
Lymphocytes Relative: 33 %
Lymphs Abs: 1.4 K/uL (ref 0.7–4.0)
MCH: 30.7 pg (ref 26.0–34.0)
MCHC: 34 g/dL (ref 30.0–36.0)
MCV: 90.2 fL (ref 80.0–100.0)
Monocytes Absolute: 0.4 K/uL (ref 0.1–1.0)
Monocytes Relative: 10 %
Neutro Abs: 2.3 K/uL (ref 1.7–7.7)
Neutrophils Relative %: 53 %
Platelet Count: 229 K/uL (ref 150–400)
RBC: 4.79 MIL/uL (ref 4.22–5.81)
RDW: 12.3 % (ref 11.5–15.5)
WBC Count: 4.2 K/uL (ref 4.0–10.5)
nRBC: 0 % (ref 0.0–0.2)

## 2024-01-29 LAB — BASIC METABOLIC PANEL - CANCER CENTER ONLY
Anion gap: 8 (ref 5–15)
BUN: 11 mg/dL (ref 6–20)
CO2: 27 mmol/L (ref 22–32)
Calcium: 9.4 mg/dL (ref 8.9–10.3)
Chloride: 103 mmol/L (ref 98–111)
Creatinine: 0.8 mg/dL (ref 0.61–1.24)
GFR, Estimated: >60 mL/min (ref >60)
Glucose, Bld: 94 mg/dL (ref 70–99)
Potassium: 4 mmol/L (ref 3.5–5.1)
Sodium: 138 mmol/L (ref 135–145)

## 2024-01-29 LAB — MAGNESIUM: Magnesium: 2 mg/dL (ref 1.7–2.4)

## 2024-01-29 MED ORDER — POTASSIUM CHLORIDE IN NACL 20-0.9 MEQ/L-% IV SOLN
Freq: Once | INTRAVENOUS | Status: AC
  Filled 2024-01-29: qty 1000

## 2024-01-29 MED ORDER — PALONOSETRON HCL INJECTION 0.25 MG/5ML
0.2500 mg | Freq: Once | INTRAVENOUS | Status: AC
  Administered 2024-01-29: 0.25 mg via INTRAVENOUS
  Filled 2024-01-29: qty 5

## 2024-01-29 MED ORDER — MAGNESIUM SULFATE 2 GM/50ML IV SOLN
2.0000 g | Freq: Once | INTRAVENOUS | Status: AC
  Administered 2024-01-29: 2 g via INTRAVENOUS
  Filled 2024-01-29: qty 50

## 2024-01-29 MED ORDER — DEXAMETHASONE SOD PHOSPHATE PF 10 MG/ML IJ SOLN
10.0000 mg | Freq: Once | INTRAMUSCULAR | Status: AC
  Administered 2024-01-29: 10 mg via INTRAVENOUS

## 2024-01-29 MED ORDER — SODIUM CHLORIDE 0.9 % IV SOLN
40.0000 mg/m2 | Freq: Once | INTRAVENOUS | Status: AC
  Administered 2024-01-29: 74 mg via INTRAVENOUS
  Filled 2024-01-29: qty 74

## 2024-01-29 MED ORDER — SODIUM CHLORIDE 0.9 % IV SOLN
150.0000 mg | Freq: Once | INTRAVENOUS | Status: AC
  Administered 2024-01-29: 150 mg via INTRAVENOUS
  Filled 2024-01-29: qty 150

## 2024-01-29 MED ORDER — SODIUM CHLORIDE 0.9 % IV SOLN: INTRAVENOUS | Status: DC

## 2024-01-29 NOTE — Progress Notes (Signed)
 [facility]  ONCOLOGY CLINIC PROGRESS NOTE   Patient Care Team: Wendee Lynwood HERO, NP as PCP - General (Nurse Practitioner) Otho Redell Curry, MD as Referring Physician (Otolaryngology) Malmfelt, Delon CROME, RN as Oncology Nurse Navigator Izell Domino, MD as Consulting Physician (Radiation Oncology)  PATIENT NAME: Benjamin Choi   MR#: [id number] DOB: 09-Mar-1991  Date of visit: 01/29/2024   ASSESSMENT & PLAN:   GRIGOR LIPSCHUTZ is a 33 y.o. 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 Focal positive for LVI and intratumoral PNI.  Margins positive.   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) Please review oncology history for additional details and timeline of events.  He underwent partial glossectomy with right cervical lymph node dissections on 12/03/23. 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 pT2, pN0, cM0.  His case was discussed in ENT multidisciplinary tumor conference on 01/13/2024.  Because of high risk features including positive margin status, focal positivity for LVI and intratumoral PNI, recommendation made to proceed with adjuvant concurrent chemoradiation.    Previously I discussed recent developments, findings, pathology reports and consensus opinion from ENT tumor conference with the patient and his wife who was accompanying.  They verbalized understanding.  He is in agreement with the plan to proceed  with concurrent chemoradiation, given positive margins.  We have discussed about role of cisplatin  being a radiosensitizer in the treatment of head and neck cancer.  We have discussed about the curative intent of chemoradiation for this patient.  Patient is willing to proceed with weekly cisplatin .  He has completed chemo education and had Port-A-Cath placement.  Started radiation treatments from 01/27/2024.  Labs today reveal no dose-limiting toxicities.  Will proceed with cycle 1 of cisplatin  today at 40 mg/m and continue this weekly during the course of radiation.  We will pre-schedule feeding tube placement around the 4th or 5th week of treatment.  Patient prefers to avoid feeding tube, if he can maintain nutrition and hydration.  Previously referrals sent for nutritionist, speech-language pathology, physical therapy for supportive care.  RTC in 1 week for cycle 2 of cisplatin .  I will see him in 2 weeks for labs, office visit and continuation of cisplatin .     I reviewed lab results and outside records for this visit and discussed relevant results with the patient. Diagnosis, plan of care and treatment options were also discussed in detail with the patient. Opportunity provided to ask questions and answers provided to his apparent satisfaction. Provided instructions to call our clinic with any problems, questions or concerns prior to return visit. I recommended to continue follow-up with PCP and sub-specialists. He verbalized understanding and agreed with the plan.   NCCN guidelines have been consulted in the planning of this patient's care.  I spent a total of 42 minutes during this encounter with the patient including review of chart and various tests results, discussions about plan of care and coordination of care plan.   Chinita Patten, MD  01/29/2024 11:57 AM  [facility] - A DEPT OF [facility] [address] [place] [address] Dept: [phone] Dept Fax: [phone]    CHIEF COMPLAINT/ REASON FOR VISIT:   Invasive squamous cell carcinoma of the right lateral margin of tongue, status post partial glossectomy and right cervical lymph node dissection in September 2025.  Focal positive for LVI and intratumoral PNI.  Margins positive.   Current Treatment: Concurrent chemoradiation with weekly cisplatin  started from 01/27/2024.  INTERVAL HISTORY:    Discussed the use of AI scribe software for clinical note transcription with the patient, who gave verbal consent to proceed.  History of Present Illness [name] is a 33 year old male who presents for treatment follow-up while undergoing chemotherapy and radiation therapy.  He is currently undergoing chemotherapy and radiation therapy, having started radiation therapy on Wednesday. Today marks his third day of treatment, and he states that the process is going well so far.  He is concerned about a stitch near his scar, which was placed with dissolvable sutures. The stitch near his scar has been present since December 03, 2023, and he wonders if it needs to be removed.  He experiences frequent urination, stating 'I'm literally peeing four times an hour,' which he attributes to increased water intake. His urine output will be monitored before starting chemotherapy today.  He has no preference for the specific day of the week for his treatments, as long as it falls between Wednesday and Friday.  He is heavily interested in motorsports.    I have reviewed the past medical history, past surgical history, social history and family history with the patient and they are unchanged from previous note.  HISTORY OF PRESENT ILLNESS:   ONCOLOGY HISTORY:   He initially presented to his PCP in December of 2024 with a painful sore on his right lateral tongue. He was given triamcinolone  paste at that time to manage this without relief achieved.   He returned to his PCP in May of 2025 with c/o persistent pain from the lesion and was advised to try magic mouthwash which again did not provide him any relief. He was subsequently advised to follow-up with his dentist who shaved down a sharp molar to help prevent further irritation against his tongue.   Given persistence of the painful tongue lesion, a biopsy was obtained on 10/27/23. Pathology showed findings consistent with keratinizing invasive squamous cell carcinoma with a minute focus of apparent LVI seen in a small vessel near the deep margin (and with tumor present in all margins of the submitted specimen).    Subsequently, the patient was referred to [facility] and was seen in consultation by Dr. Sallyann on 11/10/23 for further management. Oral examination performed at that time confirmed a 2 cm tender, ulcerated right lateral tongue mass. A laryngoscopy was also performed which showed no abnormal findings. Examination of the neck was also negative for cervical lymphadenopathy.    Imaging was advised for further evaluation and he accordingly presented for a soft tissue neck CT and chest CT at [facility] on 11/23/23 which collectively demonstrated: no definite evidence of invasive of local invasion within the soft tissues of the neck, with bilateral submental and jugular chain lymph nodes measure up to 7 mm in thickness, within normal limits. CT findings were also negative for metastatic disease within the chest.    Based on Dr. Elvis recommendations, he opted to proceed with a partial glossectomy with right cervical  lymph node dissections on 12/03/23. 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 pT2, pN0, cM0   He did return  to the ED on 12/15/23 with c/o bleeding from his mouth. Labs collected in the ED were unremarkable and the bleeding had stopped by the time that he was evaluated in the ED. No intervention was required and he was advised to follow-up with ENT in an OP setting.    Swallowing issues, if any: none   Weight Changes: 5 lbs of weight loss (likely due to diet changes secondary to tongue pain)   Pain status: tongue is numb, was 10/10 pain prior to surgery. Recently bit his tongue and caused a wound in the Right lateral tongue.  Had dental evaluation and no intervention was needed.   Other symptoms: only able to eat soft foods due to initial pain from the lesion (eventually improved to eating chicken and steak); also reported that even drinking water would irritate his tongue    Tobacco history, if any: Former off/on smoker -- quit   ETOH abuse, if any: stopped drinking.   His case was discussed in ENT multidisciplinary tumor conference on 01/13/2024.  Because of high risk features including positive margin status, focal positivity for LVI and intratumoral PNI, recommendation made to proceed with adjuvant concurrent chemoradiation.  Plan to proceed with weekly cisplatin  during the course of radiation.  Started concurrent chemoradiation with weekly cisplatin  from 01/27/2024.  Oncology History  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)  01/03/2024 Initial Diagnosis   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)   01/13/2024 Cancer Staging   Staging form: Oral Cavity, AJCC 8th Edition - Pathologic: Stage II (pT2, pN0, cM0) - Signed by Autumn Millman, MD on 01/13/2024 Stage prefix: Initial diagnosis Residual tumor (R): R1 Lymph-vascular invasion (LVI): LVI present/identified, NOS Perineural invasion (PNI): Present Subclassification of lymphovascular invasion: Intratumoral   01/29/2024 -  Chemotherapy   Patient is on Treatment Plan :  HEAD/NECK Cisplatin  (40) q7d         REVIEW OF SYSTEMS:   Review of Systems - Oncology  All other pertinent systems were reviewed with the patient and are negative.  ALLERGIES: He has no known allergies.  MEDICATIONS:  Current Outpatient Medications  Medication Sig Dispense Refill   dexamethasone  (DECADRON ) 4 MG tablet Take 2 tablets (8 mg) by mouth daily x 3 days starting the day after cisplatin  chemotherapy. Take with food. 30 tablet 1   fexofenadine  (ALLEGRA  ALLERGY) 180 MG tablet Take 1 tablet (180 mg total) by mouth daily. 90 tablet 1   fluticasone  (FLONASE ) 50 MCG/ACT nasal spray Place 2 sprays into both nostrils daily. 48 mL 1   gabapentin (NEURONTIN) 100 MG capsule Take 100 mg by mouth 3 (three) times daily.     hydrOXYzine  (ATARAX ) 10 MG tablet Take 1-2 tablets (10-20 mg total) by mouth 3 (three) times daily as needed for anxiety. 30 tablet 1   lidocaine -prilocaine  (EMLA ) cream Apply to affected area once 30 g 3   magic mouthwash (lidocaine , diphenhydrAMINE , alum & mag hydroxide) suspension Swish and spit 5 mLs 4 (four) times daily. 200 mL 0   Melatonin 10 MG TABS Take 10 mg by mouth at bedtime.  ondansetron  (ZOFRAN ) 8 MG tablet Take 1 tablet (8 mg total) by mouth every 8 (eight) hours as needed for nausea or vomiting. Start on the third day after cisplatin . 30 tablet 1   oxyCODONE (ROXICODONE) 5 MG/5ML solution Take 5 mg by mouth every 6 (six) hours as needed.     prochlorperazine  (COMPAZINE ) 10 MG tablet Take 1 tablet (10 mg total) by mouth every 6 (six) hours as needed (Nausea or vomiting). 30 tablet 1   No current facility-administered medications for this visit.   Facility-Administered Medications Ordered in Other Visits  Medication Dose Route Frequency Provider Last Rate Last Admin   0.9 %  sodium chloride  infusion   Intravenous Continuous Murial Beam, MD 10 mL/hr at 01/29/24 0932 New Bag at 01/29/24 0932   CISplatin  (PLATINOL ) 74 mg in sodium chloride  0.9 % 250  mL chemo infusion  40 mg/m2 (Treatment Plan Recorded) Intravenous Once Larcenia Holaday, MD         VITALS:   Blood pressure 114/72, pulse 67, temperature 98.2 F (36.8 C), resp. rate 17, weight 158 lb (71.7 kg), SpO2 98%.  Wt Readings from Last 3 Encounters:  01/29/24 158 lb (71.7 kg)  01/26/24 155 lb (70.3 kg)  01/18/24 155 lb 6 oz (70.5 kg)    Body mass index is 24.02 kg/m.    Onc Performance Status - 01/29/24 0852       ECOG Perf Status   ECOG Perf Status Restricted in physically strenuous activity but ambulatory and able to carry out work of a light or sedentary nature, e.g., light house work, office work      KPS SCALE   KPS % SCORE Normal activity with effort, some s/s of disease          PHYSICAL EXAM:   Physical Exam Constitutional:      General: He is not in acute distress.    Appearance: Normal appearance.  HENT:     Head: Normocephalic and atraumatic.     Mouth/Throat:     Comments: Right lateral partial glossectomy site is healed well without any residual ulceration. There is swelling/nodularity in the floor of mouth that could be reactive.  Cannot exclude gross tumor in oral cavity. Eyes:     Conjunctiva/sclera: Conjunctivae normal.  Neck:     Comments:  Right-sided neck dissection scar is healing well without any signs of infection.  Has 1 persistent stitch at the medial end. Cardiovascular:     Rate and Rhythm: Normal rate and regular rhythm.  Pulmonary:     Effort: Pulmonary effort is normal. No respiratory distress.  Abdominal:     General: There is no distension.  Neurological:     General: No focal deficit present.     Mental Status: He is alert and oriented to person, place, and time.  Psychiatric:        Mood and Affect: Mood normal.        Behavior: Behavior normal.       LABORATORY DATA:   I have reviewed the data as listed.  Results for orders placed or performed in visit on 01/29/24  Magnesium   Result Value Ref Range    Magnesium  2.0 1.7 - 2.4 mg/dL  Basic Metabolic Panel - Cancer Center Only  Result Value Ref Range   Sodium 138 135 - 145 mmol/L   Potassium 4.0 3.5 - 5.1 mmol/L   Chloride 103 98 - 111 mmol/L   CO2 27 22 - 32 mmol/L   Glucose, Bld 94  70 - 99 mg/dL   BUN 11 6 - 20 mg/dL   Creatinine 9.19 9.38 - 1.24 mg/dL   Calcium 9.4 8.9 - 89.6 mg/dL   GFR, Estimated >39 >39 mL/min   Anion gap 8 5 - 15  CBC with Differential (Cancer Center Only)  Result Value Ref Range   WBC Count 4.2 4.0 - 10.5 K/uL   RBC 4.79 4.22 - 5.81 MIL/uL   Hemoglobin 14.7 13.0 - 17.0 g/dL   HCT 56.7 60.9 - 47.9 %   MCV 90.2 80.0 - 100.0 fL   MCH 30.7 26.0 - 34.0 pg   MCHC 34.0 30.0 - 36.0 g/dL   RDW 87.6 88.4 - 84.4 %   Platelet Count 229 150 - 400 K/uL   nRBC 0.0 0.0 - 0.2 %   Neutrophils Relative % 53 %   Neutro Abs 2.3 1.7 - 7.7 K/uL   Lymphocytes Relative 33 %   Lymphs Abs 1.4 0.7 - 4.0 K/uL   Monocytes Relative 10 %   Monocytes Absolute 0.4 0.1 - 1.0 K/uL   Eosinophils Relative 3 %   Eosinophils Absolute 0.1 0.0 - 0.5 K/uL   Basophils Relative 1 %   Basophils Absolute 0.0 0.0 - 0.1 K/uL   Immature Granulocytes 0 %   Abs Immature Granulocytes 0.00 0.00 - 0.07 K/uL  Results for orders placed or performed in visit on 01/29/24  Rad Onc Aria Session Summary  Result Value Ref Range   Course ID [id number]    Course Start Date 01/18/2024    Session Number 3    Course First Treatment Date 01/27/2024 12:50 PM    Course Last Treatment Date 01/29/2024  7:48 AM    Course Elapsed Days 2    Reference Point ID [email or web address]_Tongue dp    Reference Point Dosage Given to Date 6 Gy   Reference Point Session Dosage Given 2 Gy   Plan ID HN_R_Tongue    Plan Name HN_R_Tongue    Plan Fractions Treated to Date 3    Plan Total Fractions Prescribed 33    Plan Prescribed Dose Per Fraction 2 Gy   Plan Total Prescribed Dose 66.000000 Gy   Plan Primary Reference Point HN_R_Tongue dp       RADIOGRAPHIC STUDIES:  I have personally  reviewed the radiological images as listed and agree with the findings in the report.  IR IMAGING GUIDED PORT INSERTION INDICATION: 33 year old with head and neck cancer. Port-A-Cath needed for chemotherapy.  EXAM: FLUOROSCOPIC AND ULTRASOUND GUIDED PLACEMENT OF A SUBCUTANEOUS PORT  MEDICATIONS: Benadryl  50 mg  ANESTHESIA/SEDATION: Moderate (conscious) sedation was employed during this procedure. A total of Versed  5 mg and fentanyl  150 mcg was administered intravenously at the order of the provider performing the procedure.  Total intra-service moderate sedation time: 35 minutes.  Patient's level of consciousness and vital signs were monitored continuously by radiology nurse throughout the procedure under the supervision of the provider performing the procedure.  FLUOROSCOPY TIME:  Radiation Exposure Index (as provided by the fluoroscopic device): 1 mGy Kerma  COMPLICATIONS: None immediate.  PROCEDURE: The procedure, risks, benefits, and alternatives were explained to the patient. Questions regarding the procedure were encouraged and answered. The patient understands and consents to the procedure.  Patient was placed supine on the interventional table. Ultrasound confirmed a patent right internal jugular vein. Ultrasound image was saved for documentation. The right chest and neck were cleaned with a skin antiseptic and a sterile drape was placed. Maximal barrier sterile technique was utilized  including caps, mask, sterile gowns, sterile gloves, sterile drape, hand hygiene and skin antiseptic. The right neck was anesthetized with 1% lidocaine . Small incision was made in the right neck with a blade. Micropuncture set was placed in the right internal jugular vein with ultrasound guidance. The micropuncture wire was used for measurement purposes. The right chest was anesthetized with 1% lidocaine  with epinephrine . #15 blade was used to make an incision and a subcutaneous  port pocket was formed. 8 french Power Port was assembled. Subcutaneous tunnel was formed with a stiff tunneling device. The port catheter was brought through the subcutaneous tunnel. The port was placed in the subcutaneous pocket. The micropuncture set was exchanged for a peel-away sheath. The catheter was placed through the peel-away sheath and the tip was positioned at the superior cavoatrial junction. Catheter placement was confirmed with fluoroscopy. The port was accessed and flushed with heparinized saline. The port pocket was closed using two layers of absorbable sutures and Dermabond. The vein skin site was closed using a single layer of absorbable suture and Dermabond. Sterile dressings were applied. Patient tolerated the procedure well without an immediate complication. Ultrasound and fluoroscopic images were taken and saved for this procedure.  IMPRESSION: Placement of a subcutaneous power-injectable port device. Catheter tip at the superior cavoatrial junction.  Electronically Signed   By: Juliene Balder M.D.   On: 01/26/2024 18:13    CODE STATUS:  Code Status History     Date Active Date Inactive Code Status Order ID Comments User Context   01/26/2024 1510 01/27/2024 0527 Full Code [id number]  Balder Juliene, MD HOV    Questions for Most Recent Historical Code Status (Order [id number])     Question Answer   By: Consent: discussion documented in EHR            Orders Placed This Encounter  Procedures   CBC with Differential (Cancer Center Only)    Standing Status:   Future    Expected Date:   03/11/2024    Expiration Date:   03/11/2025   Basic Metabolic Panel - Cancer Center Only    Standing Status:   Future    Expected Date:   03/11/2024    Expiration Date:   03/11/2025   Magnesium     Standing Status:   Future    Expected Date:   03/11/2024    Expiration Date:   03/11/2025     Future Appointments  Date Time Provider Department Center  01/31/2024  9:15 AM  [facility] [id number] [facility] None  02/01/2024  9:50 AM [facility] LINAC 3 [facility] None  02/01/2024 10:00 AM LINAC-SQUIRE [facility] None  02/02/2024  9:15 AM [facility] FLUSH [facility] None  02/02/2024  9:45 AM [facility] LINAC 3 [facility] None  02/03/2024  9:00 AM [facility] INFUSION [facility] None  02/03/2024  9:00 AM Neff, Barbara L, RD [facility] None  02/03/2024  9:30 AM [facility] LINAC 4 [facility] None  02/08/2024  9:15 AM [facility] [id number] [facility] None  02/09/2024  9:15 AM [facility] [id number] [facility] None  02/10/2024  9:15 AM [facility] [id number] [facility] None  02/11/2024  9:15 AM [facility] [id number] [facility] None  02/12/2024  9:15 AM [facility] [id number] [facility] None  02/15/2024  9:15 AM [facility] [id number] [facility] None  02/16/2024  9:15 AM [facility] [id number] [facility] None  02/17/2024  9:15 AM [facility] [id number] [facility] None  02/17/2024 10:00 AM Breedlove Blue, Blaire L, PT [facility] None  02/18/2024  9:15 AM [facility] [id number] [facility] None  02/19/2024  9:15 AM [facility] [id number] [facility] None  02/22/2024  9:15 AM [facility] [id number] [facility] None  02/23/2024  9:15 AM [facility] [id number] [facility] None  02/24/2024  9:15 AM [facility] [id number] [facility] None  02/25/2024  9:15 AM [facility] [id number] [facility] None  02/26/2024  9:15 AM [facility] [id number] [facility] None  02/29/2024  9:15 AM [facility] [id number] [facility] None  03/01/2024  9:15 AM [facility] [id number] [facility] None  03/02/2024  9:15 AM [facility] [id number] [facility] None  03/07/2024  9:15 AM [facility] [id number] [facility] None  03/08/2024  9:15 AM [facility] [id number] [facility] None  03/09/2024  9:15 AM [facility] [id number] [facility] None  03/11/2024  9:15 AM [facility] [id number] [facility] None  03/14/2024  9:15 AM [facility] [id number] [facility] None  03/15/2024  9:15 AM [facility] [id number]  [facility] None  03/16/2024  9:15 AM [facility] [id number] [facility] None  03/17/2024  9:15 AM [facility] [id number] [facility] None      This document was completed utilizing speech recognition software. Grammatical errors, random word insertions, pronoun errors, and incomplete sentences are an occasional consequence of this system due to software limitations, ambient noise, and hardware issues. Any formal questions or concerns about the content, text or information contained within the body of this dictation should be directly addressed to the provider for clarification.

## 2024-01-29 NOTE — Assessment & Plan Note (Signed)
 Please review oncology history for additional details and timeline of events.  He underwent partial glossectomy with right cervical lymph node dissections on 12/03/23. 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 pT2, pN0, cM0.  His case was discussed in ENT multidisciplinary tumor conference on 01/13/2024.  Because of high risk features including positive margin status, focal positivity for LVI and intratumoral PNI, recommendation made to proceed with adjuvant concurrent chemoradiation.    Previously I discussed recent developments, findings, pathology reports and consensus opinion from ENT tumor conference with the patient and his wife who was accompanying.  They verbalized understanding.  He is in agreement with the plan to proceed with concurrent chemoradiation, given positive margins.  We have discussed about role of cisplatin  being a radiosensitizer in the treatment of head and neck cancer.  We have discussed about the curative intent of chemoradiation for this patient.  Patient is willing to proceed with weekly cisplatin .  He has completed chemo education and had Port-A-Cath placement.  Started radiation treatments from 01/27/2024.  Labs today reveal no dose-limiting toxicities.  Will proceed with cycle 1 of cisplatin  today at 40 mg/m and continue this weekly during the course of radiation.  We will pre-schedule feeding tube placement around the 4th or 5th week of treatment.  Patient prefers to avoid feeding tube, if he can maintain nutrition and hydration.  Previously referrals sent for nutritionist, speech-language pathology, physical therapy for supportive care.  RTC in 1 week for cycle 2 of cisplatin .  I will  see him in 2 weeks for labs, office visit and continuation of cisplatin .

## 2024-01-29 NOTE — Progress Notes (Signed)
 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 S/p partial glossectomy 9/25 at [facility]. He is receiving concurrent chemoradiation with weekly cisplatin  (first RT 11/19). Patient is under the care of Dr. Izell and Dr. Autumn  Past medical history includes vitiligo capitis, depression, anxiety, BPD, allergic rhinitis  Met with patient and wife in infusion. Reports doing well. He is very active and having to sit for chemo is challenging. Patient has a good appetite. Tolerating regular diet well s/p partial glossectomy. Yesterday had egg sandwich, chicken/avocado flat bread, 2 chicken wraps from [employer], strawberries, rice krispies, 2 Ensure Plus. He is drinking 100-120 ounces of water. Patient reports having a tickle in throat that started yesterday after radiation. Patient has occasional constipation. Takes miralax as needed. Last BM was yesterday.   Nutrition Focused Physical Exam: deferred    Medications: decadron , gabapentin, atarax , MMW, melatonin, zofran , roxicodone, compazine    Labs: reviewed    Anthropometrics:   Height: 5'8 Weight: 158 lb  UBW: 160 lb (per pt) BMI: 24.02   Estimated Energy Needs  Kcals: [phone] Protein: 93-115 Fluid: >/= 2.3 L   NUTRITION DIAGNOSIS: Predicted sub-optimal intake related to [facility] and associated treatment as evidenced by s/p partial glossectomy, anticipated side effects including dry mouth, sore throat/painful swallow, taste changes to affect ability to eat/drink orally   INTERVENTION:  Educated on importance of adequate calorie/protein energy intake to preserve LBM and support post-treatment healing Encourage high calorie high protein foods - recommend soft moist textures for ease of intake - snack ideas + soft moist high protein foods list Educated on benefits of baking soda salt water gargles, recommend using several times daily - handout with taste changes + recipe  provided Continue drinking Ensure Plus/equivalent BID - samples of Ensure Plus, CIB, Boost VHC + coupons provided  Discussed anticipated side effects of treatment (dry mouth/thick saliva, sore throat/pain with swallow, fatigue, taste changes) likely to affect ability to meet adequate nutrition/hydration needs orally - strongly recommend consideration of feeding tube. Discussed feeding tube in depth and showed tube on PEG teaching device. Patient is agreeable to placement. Nurse navigator aware and will place referral.  Contact information   MONITORING, EVALUATION, GOAL: Patient will tolerate increased calories and protein to minimize weight loss during treatment    Next Visit: Wednesday November 26 during infusion with Heron

## 2024-01-29 NOTE — Patient Instructions (Addendum)
 [facility] - A DEPT OF [facility]  Discharge Instructions: Thank you for choosing [facility] to provide your oncology and hematology care.   If you have a lab appointment with the Cancer Center, please go directly to the Cancer Center and check in at the registration area.   Wear comfortable clothing and clothing appropriate for easy access to any Portacath or PICC line.   We strive to give you quality time with your provider. You may need to reschedule your appointment if you arrive late (15 or more minutes).  Arriving late affects you and other patients whose appointments are after yours.  Also, if you miss three or more appointments without notifying the office, you may be dismissed from the clinic at the provider's discretion.      For prescription refill requests, have your pharmacy contact our office and allow 72 hours for refills to be completed.    Today you received the following chemotherapy and/or immunotherapy agents: Cisplatin      To help prevent nausea and vomiting after your treatment, we encourage you to take your nausea medication as directed.  BELOW ARE SYMPTOMS THAT SHOULD BE REPORTED IMMEDIATELY: *FEVER GREATER THAN 100.4 F (38 C) OR HIGHER *CHILLS OR SWEATING *NAUSEA AND VOMITING THAT IS NOT CONTROLLED WITH YOUR NAUSEA MEDICATION *UNUSUAL SHORTNESS OF BREATH *UNUSUAL BRUISING OR BLEEDING *URINARY PROBLEMS (pain or burning when urinating, or frequent urination) *BOWEL PROBLEMS (unusual diarrhea, constipation, pain near the anus) TENDERNESS IN MOUTH AND THROAT WITH OR WITHOUT PRESENCE OF ULCERS (sore throat, sores in mouth, or a toothache) UNUSUAL RASH, SWELLING OR PAIN  UNUSUAL VAGINAL DISCHARGE OR ITCHING   Items with * indicate a potential emergency and should be followed up as soon as possible or go to the Emergency Department if any problems should occur.  Please show the CHEMOTHERAPY ALERT CARD or IMMUNOTHERAPY  ALERT CARD at check-in to the Emergency Department and triage nurse.  Should you have questions after your visit or need to cancel or reschedule your appointment, please contact [facility] - A DEPT OF [facility]  Dept: [phone]  and follow the prompts.  Office hours are 8:00 a.m. to 4:30 p.m. Monday - Friday. Please note that voicemails left after 4:00 p.m. may not be returned until the following business day.  We are closed weekends and major holidays. You have access to a nurse at all times for urgent questions. Please call the main number to the clinic Dept: [phone] and follow the prompts.   For any non-urgent questions, you may also contact your provider using MyChart. We now offer e-Visits for anyone 65 and older to request care online for non-urgent symptoms. For details visit [email or web address].   Also download the MyChart app! Go to the app store, search "MyChart", open the app, select [facility], and log in with your MyChart username and password.  Cisplatin Injection What is this medication? CISPLATIN (SIS pla tin) treats some types of cancer. It works by slowing down the growth of cancer cells. This medicine may be used for other purposes; ask your health care provider or pharmacist if you have questions. COMMON BRAND NAME(S): Platinol, Platinol -AQ What should I tell my care team before I take this medication? They need to know if you have any of these conditions: Eye disease, vision problems Hearing problems Kidney disease Low blood counts, such as low white cells, platelets, or red  blood cells Tingling of the fingers or toes, or other nerve disorder An unusual or allergic reaction to cisplatin, carboplatin, oxaliplatin, other medications, foods, dyes, or preservatives If you or your partner are pregnant or trying to get pregnant Breast-feeding How should I use this medication? This medication is injected into a vein. It is given  by your care team in a hospital or clinic setting. Talk to your care team about the use of this medication in children. Special care may be needed. Overdosage: If you think you have taken too much of this medicine contact a poison control center or emergency room at once. NOTE: This medicine is only for you. Do not share this medicine with others. What if I miss a dose? Keep appointments for follow-up doses. It is important not to miss your dose. Call your care team if you are unable to keep an appointment. What may interact with this medication? Do not take this medication with any of the following: Live virus vaccines This medication may also interact with the following: Certain antibiotics, such as amikacin, gentamicin, neomycin, polymyxin B, streptomycin, tobramycin, vancomycin Foscarnet This list may not describe all possible interactions. Give your health care provider a list of all the medicines, herbs, non-prescription drugs, or dietary supplements you use. Also tell them if you smoke, drink alcohol, or use illegal drugs. Some items may interact with your medicine. What should I watch for while using this medication? Your condition will be monitored carefully while you are receiving this medication. You may need blood work done while taking this medication. This medication may make you feel generally unwell. This is not uncommon, as chemotherapy can affect healthy cells as well as cancer cells. Report any side effects. Continue your course of treatment even though you feel ill unless your care team tells you to stop. This medication may increase your risk of getting an infection. Call your care team for advice if you get a fever, chills, sore throat, or other symptoms of a cold or flu. Do not treat yourself. Try to avoid being around people who are sick. Avoid taking medications that contain aspirin, acetaminophen, ibuprofen, naproxen, or ketoprofen unless instructed by your care team. These  medications may hide a fever. This medication may increase your risk to bruise or bleed. Call your care team if you notice any unusual bleeding. Be careful brushing or flossing your teeth or using a toothpick because you may get an infection or bleed more easily. If you have any dental work done, tell your dentist you are receiving this medication. Drink fluids as directed while you are taking this medication. This will help protect your kidneys. Call your care team if you get diarrhea. Do not treat yourself. Talk to your care team if you or your partner wish to become pregnant or think you might be pregnant. This medication can cause serious birth defects if taken during pregnancy and for 14 months after the last dose. A negative pregnancy test is required before starting this medication. A reliable form of contraception is recommended while taking this medication and for 14 months after the last dose. Talk to your care team about effective forms of contraception. Do not father a child while taking this medication and for 11 months after the last dose. Use a condom during sex during this time period. Do not breast-feed while taking this medication. This medication may cause infertility. Talk to your care team if you are concerned about your fertility. What side effects may I  notice from receiving this medication? Side effects that you should report to your care team as soon as possible: Allergic reactions--skin rash, itching, hives, swelling of the face, lips, tongue, or throat Eye pain, change in vision, vision loss Hearing loss, ringing in ears Infection--fever, chills, cough, sore throat, wounds that don't heal, pain or trouble when passing urine, general feeling of discomfort or being unwell Kidney injury--decrease in the amount of urine, swelling of the ankles, hands, or feet Low red blood cell level--unusual weakness or fatigue, dizziness, headache, trouble breathing Painful swelling, warmth,  or redness of the skin, blisters or sores at the infusion site Pain, tingling, or numbness in the hands or feet Unusual bruising or bleeding Side effects that usually do not require medical attention (report to your care team if they continue or are bothersome): Hair loss Nausea Vomiting This list may not describe all possible side effects. Call your doctor for medical advice about side effects. You may report side effects to FDA at 1-800-FDA-1088. Where should I keep my medication? This medication is given in a hospital or clinic. It will not be stored at home. NOTE: This sheet is a summary. It may not cover all possible information. If you have questions about this medicine, talk to your doctor, pharmacist, or health care provider.  2024 Elsevier/Gold Standard (2021-06-28 00:00:00)

## 2024-01-31 ENCOUNTER — Other Ambulatory Visit: Payer: Self-pay

## 2024-01-31 ENCOUNTER — Ambulatory Visit
Admission: RE | Admit: 2024-01-31 | Discharge: 2024-01-31 | Disposition: A | Discharge status: Home / Self Care | Source: Ambulatory Visit | Attending: Radiation Oncology | Admitting: Radiation Oncology

## 2024-01-31 DIAGNOSIS — C021 Malignant neoplasm of border of tongue: Secondary | ICD-10-CM | POA: Diagnosis not present

## 2024-01-31 DIAGNOSIS — Z5111 Encounter for antineoplastic chemotherapy: Secondary | ICD-10-CM | POA: Diagnosis not present

## 2024-01-31 DIAGNOSIS — Z9221 Personal history of antineoplastic chemotherapy: Secondary | ICD-10-CM | POA: Diagnosis not present

## 2024-01-31 DIAGNOSIS — Z79899 Other long term (current) drug therapy: Secondary | ICD-10-CM | POA: Diagnosis not present

## 2024-01-31 DIAGNOSIS — Z923 Personal history of irradiation: Secondary | ICD-10-CM | POA: Diagnosis not present

## 2024-01-31 DIAGNOSIS — C023 Malignant neoplasm of anterior two-thirds of tongue, part unspecified: Secondary | ICD-10-CM | POA: Diagnosis not present

## 2024-01-31 LAB — RAD ONC ARIA SESSION SUMMARY
Course Elapsed Days: 4
Plan Fractions Treated to Date: 4
Plan Prescribed Dose Per Fraction: 2 Gy
Plan Total Fractions Prescribed: 33
Plan Total Prescribed Dose: 66 Gy
Reference Point Dosage Given to Date: 8 Gy
Reference Point Session Dosage Given: 2 Gy
Session Number: 4

## 2024-02-01 ENCOUNTER — Ambulatory Visit
Admission: RE | Admit: 2024-02-01 | Discharge: 2024-02-01 | Disposition: A | Source: Ambulatory Visit | Attending: Radiation Oncology

## 2024-02-01 ENCOUNTER — Other Ambulatory Visit: Payer: Self-pay

## 2024-02-01 ENCOUNTER — Ambulatory Visit
Admission: RE | Admit: 2024-02-01 | Discharge: 2024-02-01 | Disposition: A | Discharge status: Home / Self Care | Source: Ambulatory Visit | Attending: Radiation Oncology | Admitting: Radiation Oncology

## 2024-02-01 ENCOUNTER — Other Ambulatory Visit: Payer: Self-pay | Admitting: Nurse Practitioner

## 2024-02-01 ENCOUNTER — Telehealth: Payer: Self-pay

## 2024-02-01 ENCOUNTER — Other Ambulatory Visit: Payer: Self-pay | Admitting: Radiation Oncology

## 2024-02-01 DIAGNOSIS — C023 Malignant neoplasm of anterior two-thirds of tongue, part unspecified: Secondary | ICD-10-CM

## 2024-02-01 DIAGNOSIS — Z9221 Personal history of antineoplastic chemotherapy: Secondary | ICD-10-CM | POA: Diagnosis not present

## 2024-02-01 DIAGNOSIS — Z5111 Encounter for antineoplastic chemotherapy: Secondary | ICD-10-CM | POA: Diagnosis not present

## 2024-02-01 DIAGNOSIS — Z923 Personal history of irradiation: Secondary | ICD-10-CM | POA: Diagnosis not present

## 2024-02-01 DIAGNOSIS — C021 Malignant neoplasm of border of tongue: Secondary | ICD-10-CM | POA: Diagnosis not present

## 2024-02-01 DIAGNOSIS — Z79899 Other long term (current) drug therapy: Secondary | ICD-10-CM | POA: Diagnosis not present

## 2024-02-01 LAB — RAD ONC ARIA SESSION SUMMARY
Course Elapsed Days: 5
Plan Fractions Treated to Date: 5
Plan Prescribed Dose Per Fraction: 2 Gy
Plan Total Fractions Prescribed: 33
Plan Total Prescribed Dose: 66 Gy
Reference Point Dosage Given to Date: 10 Gy
Reference Point Session Dosage Given: 2 Gy
Session Number: 5

## 2024-02-01 MED ORDER — SONAFINE EX EMUL
1.0000 | Freq: Two times a day (BID) | CUTANEOUS | Status: DC
  Administered 2024-02-01: 1 via TOPICAL

## 2024-02-01 MED ORDER — LIDOCAINE VISCOUS HCL 2 % MT SOLN: OROMUCOSAL | 3 refills | Status: AC

## 2024-02-01 NOTE — Telephone Encounter (Unsigned)
 Copied from CRM [id number]. Topic: Clinical - Medication Refill >> Feb 01, 2024 11:52 AM Eva FALCON wrote: Medication:  hydrOXYzine  (ATARAX ) 10 MG tablet    Has the patient contacted their pharmacy? Yes, had to contact doctor (Agent: If no, request that the patient contact the pharmacy for the refill. If patient does not wish to contact the pharmacy document the reason why and proceed with request.) (Agent: If yes, when and what did the pharmacy advise?)  This is the patient's preferred pharmacy:  [facility] [phone] [facility] [place] - [address] [place] [address] Phone: [phone] Fax: [phone]  Is this the correct pharmacy for this prescription? Yes If no, delete pharmacy and type the correct one.   Has the prescription been filled recently? Yes  Is the patient out of the medication? No  Has the patient been seen for an appointment in the last year OR does the patient have an upcoming appointment? Yes  Can we respond through MyChart? Yes  Agent: Please be advised that Rx refills may take up to 3 business days. We ask that you follow-up with your pharmacy.

## 2024-02-01 NOTE — Telephone Encounter (Signed)
-----   Message from Nurse Ileana HERO sent at 01/29/2024  2:53 PM EST ----- Regarding: First time Cisplatin . Pt of Pasam First time cisplatin . Pt of pasam. Tolerated infusion well. Please call to check in with pt when possible, thanks!

## 2024-02-02 ENCOUNTER — Inpatient Hospital Stay

## 2024-02-02 ENCOUNTER — Ambulatory Visit (HOSPITAL_COMMUNITY)
Admission: RE | Admit: 2024-02-02 | Discharge: 2024-02-02 | Disposition: A | Discharge status: Home / Self Care | Source: Ambulatory Visit | Attending: Oncology | Admitting: Oncology

## 2024-02-02 ENCOUNTER — Other Ambulatory Visit: Payer: Self-pay

## 2024-02-02 ENCOUNTER — Ambulatory Visit
Admission: RE | Admit: 2024-02-02 | Discharge: 2024-02-02 | Disposition: A | Discharge status: Home / Self Care | Source: Ambulatory Visit | Attending: Radiation Oncology | Admitting: Radiation Oncology

## 2024-02-02 DIAGNOSIS — C023 Malignant neoplasm of anterior two-thirds of tongue, part unspecified: Secondary | ICD-10-CM | POA: Insufficient documentation

## 2024-02-02 DIAGNOSIS — C760 Malignant neoplasm of head, face and neck: Secondary | ICD-10-CM | POA: Diagnosis not present

## 2024-02-02 DIAGNOSIS — C021 Malignant neoplasm of border of tongue: Secondary | ICD-10-CM | POA: Diagnosis not present

## 2024-02-02 DIAGNOSIS — Z79899 Other long term (current) drug therapy: Secondary | ICD-10-CM | POA: Diagnosis not present

## 2024-02-02 DIAGNOSIS — Z4682 Encounter for fitting and adjustment of non-vascular catheter: Secondary | ICD-10-CM | POA: Diagnosis not present

## 2024-02-02 DIAGNOSIS — Z01818 Encounter for other preprocedural examination: Secondary | ICD-10-CM | POA: Diagnosis not present

## 2024-02-02 DIAGNOSIS — Z9221 Personal history of antineoplastic chemotherapy: Secondary | ICD-10-CM | POA: Diagnosis not present

## 2024-02-02 DIAGNOSIS — Z931 Gastrostomy status: Secondary | ICD-10-CM | POA: Diagnosis not present

## 2024-02-02 DIAGNOSIS — Z5111 Encounter for antineoplastic chemotherapy: Secondary | ICD-10-CM | POA: Diagnosis not present

## 2024-02-02 DIAGNOSIS — Z923 Personal history of irradiation: Secondary | ICD-10-CM | POA: Diagnosis not present

## 2024-02-02 LAB — RAD ONC ARIA SESSION SUMMARY
Course Elapsed Days: 6
Plan Fractions Treated to Date: 6
Plan Prescribed Dose Per Fraction: 2 Gy
Plan Total Fractions Prescribed: 33
Plan Total Prescribed Dose: 66 Gy
Reference Point Dosage Given to Date: 12 Gy
Reference Point Session Dosage Given: 2 Gy
Session Number: 6

## 2024-02-02 LAB — CBC WITH DIFFERENTIAL (CANCER CENTER ONLY)
Abs Immature Granulocytes: 0.01 K/uL (ref 0.00–0.07)
Basophils Absolute: 0 K/uL (ref 0.0–0.1)
Basophils Relative: 1 %
Eosinophils Absolute: 0.1 K/uL (ref 0.0–0.5)
Eosinophils Relative: 3 %
HCT: 44 % (ref 39.0–52.0)
Hemoglobin: 15.4 g/dL (ref 13.0–17.0)
Immature Granulocytes: 0 %
Lymphocytes Relative: 28 %
Lymphs Abs: 1.3 K/uL (ref 0.7–4.0)
MCH: 31 pg (ref 26.0–34.0)
MCHC: 35 g/dL (ref 30.0–36.0)
MCV: 88.5 fL (ref 80.0–100.0)
Monocytes Absolute: 0.5 K/uL (ref 0.1–1.0)
Monocytes Relative: 10 %
Neutro Abs: 2.8 K/uL (ref 1.7–7.7)
Neutrophils Relative %: 58 %
Platelet Count: 226 K/uL (ref 150–400)
RBC: 4.97 MIL/uL (ref 4.22–5.81)
RDW: 12.1 % (ref 11.5–15.5)
WBC Count: 4.8 K/uL (ref 4.0–10.5)
nRBC: 0 % (ref 0.0–0.2)

## 2024-02-02 LAB — BASIC METABOLIC PANEL - CANCER CENTER ONLY
Anion gap: 8 (ref 5–15)
BUN: 16 mg/dL (ref 6–20)
CO2: 28 mmol/L (ref 22–32)
Calcium: 9.5 mg/dL (ref 8.9–10.3)
Chloride: 101 mmol/L (ref 98–111)
Creatinine: 0.78 mg/dL (ref 0.61–1.24)
GFR, Estimated: >60 mL/min (ref >60)
Glucose, Bld: 79 mg/dL (ref 70–99)
Potassium: 4.3 mmol/L (ref 3.5–5.1)
Sodium: 137 mmol/L (ref 135–145)

## 2024-02-02 LAB — MAGNESIUM: Magnesium: 2.1 mg/dL (ref 1.7–2.4)

## 2024-02-02 MED FILL — Fosaprepitant Dimeglumine For IV Infusion 150 MG (Base Eq): INTRAVENOUS | Qty: 5 | Status: AC

## 2024-02-03 ENCOUNTER — Inpatient Hospital Stay: Admitting: Nutrition

## 2024-02-03 ENCOUNTER — Encounter (HOSPITAL_COMMUNITY): Payer: Self-pay

## 2024-02-03 ENCOUNTER — Ambulatory Visit
Admission: RE | Admit: 2024-02-03 | Discharge: 2024-02-03 | Disposition: A | Discharge status: Home / Self Care | Source: Ambulatory Visit | Attending: Radiation Oncology | Admitting: Radiation Oncology

## 2024-02-03 ENCOUNTER — Encounter: Payer: Self-pay | Admitting: Oncology

## 2024-02-03 ENCOUNTER — Inpatient Hospital Stay

## 2024-02-03 ENCOUNTER — Other Ambulatory Visit: Payer: Self-pay

## 2024-02-03 VITALS — BP 135/73 | HR 83 | Temp 97.6°F | Resp 18 | Wt 157.1 lb

## 2024-02-03 DIAGNOSIS — Z923 Personal history of irradiation: Secondary | ICD-10-CM | POA: Diagnosis not present

## 2024-02-03 DIAGNOSIS — Z9221 Personal history of antineoplastic chemotherapy: Secondary | ICD-10-CM | POA: Diagnosis not present

## 2024-02-03 DIAGNOSIS — Z5111 Encounter for antineoplastic chemotherapy: Secondary | ICD-10-CM | POA: Diagnosis not present

## 2024-02-03 DIAGNOSIS — C023 Malignant neoplasm of anterior two-thirds of tongue, part unspecified: Secondary | ICD-10-CM | POA: Diagnosis not present

## 2024-02-03 DIAGNOSIS — C021 Malignant neoplasm of border of tongue: Secondary | ICD-10-CM | POA: Diagnosis not present

## 2024-02-03 DIAGNOSIS — Z79899 Other long term (current) drug therapy: Secondary | ICD-10-CM | POA: Diagnosis not present

## 2024-02-03 LAB — RAD ONC ARIA SESSION SUMMARY
Course Elapsed Days: 7
Plan Fractions Treated to Date: 7
Plan Prescribed Dose Per Fraction: 2 Gy
Plan Total Fractions Prescribed: 33
Plan Total Prescribed Dose: 66 Gy
Reference Point Dosage Given to Date: 14 Gy
Reference Point Session Dosage Given: 2 Gy
Session Number: 7

## 2024-02-03 MED ORDER — SODIUM CHLORIDE 0.9 % IV SOLN: INTRAVENOUS | Status: DC

## 2024-02-03 MED ORDER — POTASSIUM CHLORIDE IN NACL 20-0.9 MEQ/L-% IV SOLN
Freq: Once | INTRAVENOUS | Status: AC
  Filled 2024-02-03: qty 1000

## 2024-02-03 MED ORDER — SODIUM CHLORIDE 0.9 % IV SOLN
40.0000 mg/m2 | Freq: Once | INTRAVENOUS | Status: AC
  Administered 2024-02-03: 74 mg via INTRAVENOUS
  Filled 2024-02-03: qty 74

## 2024-02-03 MED ORDER — PALONOSETRON HCL INJECTION 0.25 MG/5ML
0.2500 mg | Freq: Once | INTRAVENOUS | Status: AC
  Administered 2024-02-03: 0.25 mg via INTRAVENOUS
  Filled 2024-02-03: qty 5

## 2024-02-03 MED ORDER — HYDROXYZINE HCL 10 MG PO TABS: 10.0000 mg | ORAL_TABLET | Freq: Three times a day (TID) | ORAL | 1 refills | Status: DC | PRN

## 2024-02-03 MED ORDER — DEXAMETHASONE SOD PHOSPHATE PF 10 MG/ML IJ SOLN
10.0000 mg | Freq: Once | INTRAMUSCULAR | Status: AC
  Administered 2024-02-03: 10 mg via INTRAVENOUS

## 2024-02-03 MED ORDER — MAGNESIUM SULFATE 2 GM/50ML IV SOLN
2.0000 g | Freq: Once | INTRAVENOUS | Status: AC
  Administered 2024-02-03: 2 g via INTRAVENOUS
  Filled 2024-02-03: qty 50

## 2024-02-03 MED ORDER — SODIUM CHLORIDE 0.9 % IV SOLN
150.0000 mg | Freq: Once | INTRAVENOUS | Status: AC
  Administered 2024-02-03: 150 mg via INTRAVENOUS
  Filled 2024-02-03: qty 150

## 2024-02-03 NOTE — Progress Notes (Signed)
 Nutrition follow-up completed with patient during infusion for SCC of the right two thirds tongue.  Status post partial glossectomy September 2025 at [facility].  Patient receives concurrent chemoradiation therapy with weekly cisplatin  and is under the care of Dr. Izell and Dr. Autumn.  G-tube placement: Scheduled December 5. DME: Not applicable  Weight: 157 pounds November 26 155 pounds November 18 155.2 pounds October 23 167.6 pounds June 30  Labs reviewed.  Estimated nutrition needs: 2365-2580 cal, 93-115 g protein, greater equal to 2.3 L fluid.  Patient has been trying to stay active when able.  His appetite is good and he has gained 2 pounds.  He had some nausea but it has resolved.  He has suffered from constipation and is taking MiraLAX regularly.  He is drinking oral nutrition supplements as well as at least 100 ounces of water daily.  His throat is beginning to get sore.  Nutrition diagnosis: Predicted sub-optimal energy intake, continues.  Intervention: Provided support and encouragement regarding oral intake as well as fluid intake. Education provided on reasons for feeding tube placement. Continue oral nutrition supplements as needed. Continue baking soda and salt water gargle frequently throughout the day.  Monitoring, evaluation, goals: Patient will tolerate adequate calories and protein for minimal weight loss.  Next visit: To be scheduled with upcoming treatment.  **Disclaimer: This note was dictated with voice recognition software. Similar sounding words can inadvertently be transcribed and this note may contain transcription errors which may not have been corrected upon publication of note.**

## 2024-02-03 NOTE — Progress Notes (Signed)
 RE: GTUBE REQUEST Received: Today Karalee Wilkie POUR, MD  Tommie Riggs Approved.  HKM

## 2024-02-03 NOTE — Progress Notes (Signed)
 Oncology Nurse Navigator Documentation   Met with Benjamin Choi and his wife to provide PEG education prior to 02/12/24 placement. Using  PEG teaching device and Teach Back, provided education for PEG use and care, including: hand hygiene, gravity bolus administration of daily water flushes and nutritional supplement, fluids and medications; care of tube insertion site including daily dressing change and cleaning; S&S of infection.   Benjamin Choi correctly verbalized procedures for gravity administration of water, dressing change and site care.  I provided written instructions for PEG flushing/dressing change in support of verbal instruction.   I provided/described contents of Start of Care Bolus Feeding Kit (2 60 cc syringes, 1 box 4x4 drainage sponges, 1 package mesh briefs, 1 roll paper tape, 1 case Osmolite 1.5).  He voiced understanding he is to start using Osmolite per guidance of Nutrition. He understands I will be available for ongoing PEG support. Provided barium sulfate prep which I obtained from [facility] and reviewed instructions.    [name] RN, BSN, OCN Head & Neck Oncology Nurse Navigator [facility] Phone # [phone]  Fax # [phone]

## 2024-02-03 NOTE — Patient Instructions (Signed)
 [facility] - A DEPT OF [facility]  Discharge Instructions: Thank you for choosing [facility] to provide your oncology and hematology care.   If you have a lab appointment with the Cancer Center, please go directly to the Cancer Center and check in at the registration area.   Wear comfortable clothing and clothing appropriate for easy access to any Portacath or PICC line.   We strive to give you quality time with your provider. You may need to reschedule your appointment if you arrive late (15 or more minutes).  Arriving late affects you and other patients whose appointments are after yours.  Also, if you miss three or more appointments without notifying the office, you may be dismissed from the clinic at the provider's discretion.      For prescription refill requests, have your pharmacy contact our office and allow 72 hours for refills to be completed.    Today you received the following chemotherapy and/or immunotherapy agents: Cisplatin      To help prevent nausea and vomiting after your treatment, we encourage you to take your nausea medication as directed.  BELOW ARE SYMPTOMS THAT SHOULD BE REPORTED IMMEDIATELY: *FEVER GREATER THAN 100.4 F (38 C) OR HIGHER *CHILLS OR SWEATING *NAUSEA AND VOMITING THAT IS NOT CONTROLLED WITH YOUR NAUSEA MEDICATION *UNUSUAL SHORTNESS OF BREATH *UNUSUAL BRUISING OR BLEEDING *URINARY PROBLEMS (pain or burning when urinating, or frequent urination) *BOWEL PROBLEMS (unusual diarrhea, constipation, pain near the anus) TENDERNESS IN MOUTH AND THROAT WITH OR WITHOUT PRESENCE OF ULCERS (sore throat, sores in mouth, or a toothache) UNUSUAL RASH, SWELLING OR PAIN  UNUSUAL VAGINAL DISCHARGE OR ITCHING   Items with * indicate a potential emergency and should be followed up as soon as possible or go to the Emergency Department if any problems should occur.  Please show the CHEMOTHERAPY ALERT CARD or IMMUNOTHERAPY  ALERT CARD at check-in to the Emergency Department and triage nurse.  Should you have questions after your visit or need to cancel or reschedule your appointment, please contact [facility] - A DEPT OF [facility]  Dept: [phone]  and follow the prompts.  Office hours are 8:00 a.m. to 4:30 p.m. Monday - Friday. Please note that voicemails left after 4:00 p.m. may not be returned until the following business day.  We are closed weekends and major holidays. You have access to a nurse at all times for urgent questions. Please call the main number to the clinic Dept: [phone] and follow the prompts.   For any non-urgent questions, you may also contact your provider using MyChart. We now offer e-Visits for anyone 65 and older to request care online for non-urgent symptoms. For details visit [email or web address].   Also download the MyChart app! Go to the app store, search "MyChart", open the app, select [facility], and log in with your MyChart username and password.  Cisplatin Injection What is this medication? CISPLATIN (SIS pla tin) treats some types of cancer. It works by slowing down the growth of cancer cells. This medicine may be used for other purposes; ask your health care provider or pharmacist if you have questions. COMMON BRAND NAME(S): Platinol, Platinol -AQ What should I tell my care team before I take this medication? They need to know if you have any of these conditions: Eye disease, vision problems Hearing problems Kidney disease Low blood counts, such as low white cells, platelets, or red  blood cells Tingling of the fingers or toes, or other nerve disorder An unusual or allergic reaction to cisplatin, carboplatin, oxaliplatin, other medications, foods, dyes, or preservatives If you or your partner are pregnant or trying to get pregnant Breast-feeding How should I use this medication? This medication is injected into a vein. It is given  by your care team in a hospital or clinic setting. Talk to your care team about the use of this medication in children. Special care may be needed. Overdosage: If you think you have taken too much of this medicine contact a poison control center or emergency room at once. NOTE: This medicine is only for you. Do not share this medicine with others. What if I miss a dose? Keep appointments for follow-up doses. It is important not to miss your dose. Call your care team if you are unable to keep an appointment. What may interact with this medication? Do not take this medication with any of the following: Live virus vaccines This medication may also interact with the following: Certain antibiotics, such as amikacin, gentamicin, neomycin, polymyxin B, streptomycin, tobramycin, vancomycin Foscarnet This list may not describe all possible interactions. Give your health care provider a list of all the medicines, herbs, non-prescription drugs, or dietary supplements you use. Also tell them if you smoke, drink alcohol, or use illegal drugs. Some items may interact with your medicine. What should I watch for while using this medication? Your condition will be monitored carefully while you are receiving this medication. You may need blood work done while taking this medication. This medication may make you feel generally unwell. This is not uncommon, as chemotherapy can affect healthy cells as well as cancer cells. Report any side effects. Continue your course of treatment even though you feel ill unless your care team tells you to stop. This medication may increase your risk of getting an infection. Call your care team for advice if you get a fever, chills, sore throat, or other symptoms of a cold or flu. Do not treat yourself. Try to avoid being around people who are sick. Avoid taking medications that contain aspirin, acetaminophen, ibuprofen, naproxen, or ketoprofen unless instructed by your care team. These  medications may hide a fever. This medication may increase your risk to bruise or bleed. Call your care team if you notice any unusual bleeding. Be careful brushing or flossing your teeth or using a toothpick because you may get an infection or bleed more easily. If you have any dental work done, tell your dentist you are receiving this medication. Drink fluids as directed while you are taking this medication. This will help protect your kidneys. Call your care team if you get diarrhea. Do not treat yourself. Talk to your care team if you or your partner wish to become pregnant or think you might be pregnant. This medication can cause serious birth defects if taken during pregnancy and for 14 months after the last dose. A negative pregnancy test is required before starting this medication. A reliable form of contraception is recommended while taking this medication and for 14 months after the last dose. Talk to your care team about effective forms of contraception. Do not father a child while taking this medication and for 11 months after the last dose. Use a condom during sex during this time period. Do not breast-feed while taking this medication. This medication may cause infertility. Talk to your care team if you are concerned about your fertility. What side effects may I  notice from receiving this medication? Side effects that you should report to your care team as soon as possible: Allergic reactions--skin rash, itching, hives, swelling of the face, lips, tongue, or throat Eye pain, change in vision, vision loss Hearing loss, ringing in ears Infection--fever, chills, cough, sore throat, wounds that don't heal, pain or trouble when passing urine, general feeling of discomfort or being unwell Kidney injury--decrease in the amount of urine, swelling of the ankles, hands, or feet Low red blood cell level--unusual weakness or fatigue, dizziness, headache, trouble breathing Painful swelling, warmth,  or redness of the skin, blisters or sores at the infusion site Pain, tingling, or numbness in the hands or feet Unusual bruising or bleeding Side effects that usually do not require medical attention (report to your care team if they continue or are bothersome): Hair loss Nausea Vomiting This list may not describe all possible side effects. Call your doctor for medical advice about side effects. You may report side effects to FDA at 1-800-FDA-1088. Where should I keep my medication? This medication is given in a hospital or clinic. It will not be stored at home. NOTE: This sheet is a summary. It may not cover all possible information. If you have questions about this medicine, talk to your doctor, pharmacist, or health care provider.  2024 Elsevier/Gold Standard (2021-06-28 00:00:00)

## 2024-02-08 ENCOUNTER — Ambulatory Visit
Admission: RE | Admit: 2024-02-08 | Discharge: 2024-02-08 | Disposition: A | Source: Ambulatory Visit | Attending: Radiation Oncology

## 2024-02-08 ENCOUNTER — Other Ambulatory Visit: Payer: Self-pay

## 2024-02-08 DIAGNOSIS — Z923 Personal history of irradiation: Secondary | ICD-10-CM | POA: Insufficient documentation

## 2024-02-08 DIAGNOSIS — T451X5A Adverse effect of antineoplastic and immunosuppressive drugs, initial encounter: Secondary | ICD-10-CM | POA: Insufficient documentation

## 2024-02-08 DIAGNOSIS — Z5111 Encounter for antineoplastic chemotherapy: Secondary | ICD-10-CM | POA: Insufficient documentation

## 2024-02-08 DIAGNOSIS — C021 Malignant neoplasm of border of tongue: Secondary | ICD-10-CM | POA: Diagnosis not present

## 2024-02-08 DIAGNOSIS — K1233 Oral mucositis (ulcerative) due to radiation: Secondary | ICD-10-CM | POA: Insufficient documentation

## 2024-02-08 DIAGNOSIS — Z808 Family history of malignant neoplasm of other organs or systems: Secondary | ICD-10-CM | POA: Diagnosis not present

## 2024-02-08 DIAGNOSIS — R110 Nausea: Secondary | ICD-10-CM | POA: Diagnosis not present

## 2024-02-08 DIAGNOSIS — L089 Local infection of the skin and subcutaneous tissue, unspecified: Secondary | ICD-10-CM | POA: Insufficient documentation

## 2024-02-08 DIAGNOSIS — Z7952 Long term (current) use of systemic steroids: Secondary | ICD-10-CM | POA: Diagnosis not present

## 2024-02-08 DIAGNOSIS — D701 Agranulocytosis secondary to cancer chemotherapy: Secondary | ICD-10-CM | POA: Insufficient documentation

## 2024-02-08 DIAGNOSIS — R1310 Dysphagia, unspecified: Secondary | ICD-10-CM | POA: Diagnosis not present

## 2024-02-08 DIAGNOSIS — Z79899 Other long term (current) drug therapy: Secondary | ICD-10-CM | POA: Insufficient documentation

## 2024-02-08 DIAGNOSIS — C770 Secondary and unspecified malignant neoplasm of lymph nodes of head, face and neck: Secondary | ICD-10-CM | POA: Diagnosis not present

## 2024-02-08 DIAGNOSIS — Z510 Encounter for antineoplastic radiation therapy: Secondary | ICD-10-CM | POA: Insufficient documentation

## 2024-02-08 DIAGNOSIS — C023 Malignant neoplasm of anterior two-thirds of tongue, part unspecified: Secondary | ICD-10-CM | POA: Insufficient documentation

## 2024-02-08 LAB — RAD ONC ARIA SESSION SUMMARY
Course Elapsed Days: 12
Plan Fractions Treated to Date: 8
Plan Prescribed Dose Per Fraction: 2 Gy
Plan Total Fractions Prescribed: 33
Plan Total Prescribed Dose: 66 Gy
Reference Point Dosage Given to Date: 16 Gy
Reference Point Session Dosage Given: 2 Gy
Session Number: 8

## 2024-02-09 ENCOUNTER — Ambulatory Visit
Admission: RE | Admit: 2024-02-09 | Discharge: 2024-02-09 | Disposition: A | Source: Ambulatory Visit | Attending: Radiation Oncology

## 2024-02-09 ENCOUNTER — Other Ambulatory Visit: Payer: Self-pay

## 2024-02-09 DIAGNOSIS — Z510 Encounter for antineoplastic radiation therapy: Secondary | ICD-10-CM | POA: Diagnosis not present

## 2024-02-09 LAB — RAD ONC ARIA SESSION SUMMARY
Course Elapsed Days: 13
Plan Fractions Treated to Date: 9
Plan Prescribed Dose Per Fraction: 2 Gy
Plan Total Fractions Prescribed: 33
Plan Total Prescribed Dose: 66 Gy
Reference Point Dosage Given to Date: 18 Gy
Reference Point Session Dosage Given: 2 Gy
Session Number: 9

## 2024-02-10 ENCOUNTER — Ambulatory Visit
Admission: RE | Admit: 2024-02-10 | Discharge: 2024-02-10 | Disposition: A | Source: Ambulatory Visit | Attending: Radiation Oncology

## 2024-02-10 ENCOUNTER — Other Ambulatory Visit: Payer: Self-pay

## 2024-02-10 DIAGNOSIS — Z510 Encounter for antineoplastic radiation therapy: Secondary | ICD-10-CM | POA: Diagnosis not present

## 2024-02-10 LAB — RAD ONC ARIA SESSION SUMMARY
Course Elapsed Days: 14
Plan Fractions Treated to Date: 10
Plan Prescribed Dose Per Fraction: 2 Gy
Plan Total Fractions Prescribed: 33
Plan Total Prescribed Dose: 66 Gy
Reference Point Dosage Given to Date: 20 Gy
Reference Point Session Dosage Given: 2 Gy
Session Number: 10

## 2024-02-10 MED FILL — Fosaprepitant Dimeglumine For IV Infusion 150 MG (Base Eq): INTRAVENOUS | Qty: 5 | Status: AC

## 2024-02-11 ENCOUNTER — Other Ambulatory Visit: Payer: Self-pay

## 2024-02-11 ENCOUNTER — Inpatient Hospital Stay: Admitting: Nutrition

## 2024-02-11 ENCOUNTER — Inpatient Hospital Stay

## 2024-02-11 ENCOUNTER — Ambulatory Visit: Admission: RE | Admit: 2024-02-11 | Discharge: 2024-02-11 | Attending: Radiation Oncology

## 2024-02-11 ENCOUNTER — Inpatient Hospital Stay: Attending: Oncology | Admitting: Oncology

## 2024-02-11 ENCOUNTER — Encounter: Payer: Self-pay | Admitting: Oncology

## 2024-02-11 ENCOUNTER — Inpatient Hospital Stay: Attending: Oncology

## 2024-02-11 VITALS — BP 119/71 | HR 88 | Temp 98.3°F | Resp 18 | Ht 68.0 in | Wt 157.0 lb

## 2024-02-11 DIAGNOSIS — Z5111 Encounter for antineoplastic chemotherapy: Secondary | ICD-10-CM | POA: Insufficient documentation

## 2024-02-11 DIAGNOSIS — Z79899 Other long term (current) drug therapy: Secondary | ICD-10-CM | POA: Insufficient documentation

## 2024-02-11 DIAGNOSIS — Z510 Encounter for antineoplastic radiation therapy: Secondary | ICD-10-CM | POA: Diagnosis not present

## 2024-02-11 DIAGNOSIS — Z923 Personal history of irradiation: Secondary | ICD-10-CM | POA: Insufficient documentation

## 2024-02-11 DIAGNOSIS — C021 Malignant neoplasm of border of tongue: Secondary | ICD-10-CM | POA: Insufficient documentation

## 2024-02-11 DIAGNOSIS — T451X5A Adverse effect of antineoplastic and immunosuppressive drugs, initial encounter: Secondary | ICD-10-CM | POA: Insufficient documentation

## 2024-02-11 DIAGNOSIS — K1233 Oral mucositis (ulcerative) due to radiation: Secondary | ICD-10-CM | POA: Insufficient documentation

## 2024-02-11 DIAGNOSIS — D701 Agranulocytosis secondary to cancer chemotherapy: Secondary | ICD-10-CM | POA: Insufficient documentation

## 2024-02-11 DIAGNOSIS — R110 Nausea: Secondary | ICD-10-CM | POA: Insufficient documentation

## 2024-02-11 DIAGNOSIS — C770 Secondary and unspecified malignant neoplasm of lymph nodes of head, face and neck: Secondary | ICD-10-CM | POA: Insufficient documentation

## 2024-02-11 DIAGNOSIS — L089 Local infection of the skin and subcutaneous tissue, unspecified: Secondary | ICD-10-CM | POA: Insufficient documentation

## 2024-02-11 DIAGNOSIS — C023 Malignant neoplasm of anterior two-thirds of tongue, part unspecified: Secondary | ICD-10-CM

## 2024-02-11 DIAGNOSIS — Z7952 Long term (current) use of systemic steroids: Secondary | ICD-10-CM | POA: Insufficient documentation

## 2024-02-11 DIAGNOSIS — Z01812 Encounter for preprocedural laboratory examination: Secondary | ICD-10-CM

## 2024-02-11 LAB — BASIC METABOLIC PANEL - CANCER CENTER ONLY
Anion gap: 8 (ref 5–15)
BUN: 10 mg/dL (ref 6–20)
CO2: 28 mmol/L (ref 22–32)
Calcium: 9.2 mg/dL (ref 8.9–10.3)
Chloride: 104 mmol/L (ref 98–111)
Creatinine: 0.76 mg/dL (ref 0.61–1.24)
GFR, Estimated: >60 mL/min (ref >60)
Glucose, Bld: 117 mg/dL — ABNORMAL HIGH (ref 70–99)
Potassium: 4 mmol/L (ref 3.5–5.1)
Sodium: 139 mmol/L (ref 135–145)

## 2024-02-11 LAB — RAD ONC ARIA SESSION SUMMARY
Course Elapsed Days: 15
Plan Fractions Treated to Date: 11
Plan Prescribed Dose Per Fraction: 2 Gy
Plan Total Fractions Prescribed: 33
Plan Total Prescribed Dose: 66 Gy
Reference Point Dosage Given to Date: 22 Gy
Reference Point Session Dosage Given: 2 Gy
Session Number: 11

## 2024-02-11 LAB — CBC WITH DIFFERENTIAL (CANCER CENTER ONLY)
Abs Immature Granulocytes: 0.04 K/uL (ref 0.00–0.07)
Basophils Absolute: 0 K/uL (ref 0.0–0.1)
Basophils Relative: 0 %
Eosinophils Absolute: 0.2 K/uL (ref 0.0–0.5)
Eosinophils Relative: 3 %
HCT: 41.7 % (ref 39.0–52.0)
Hemoglobin: 14.6 g/dL (ref 13.0–17.0)
Immature Granulocytes: 1 %
Lymphocytes Relative: 15 %
Lymphs Abs: 0.9 K/uL (ref 0.7–4.0)
MCH: 31.4 pg (ref 26.0–34.0)
MCHC: 35 g/dL (ref 30.0–36.0)
MCV: 89.7 fL (ref 80.0–100.0)
Monocytes Absolute: 0.4 K/uL (ref 0.1–1.0)
Monocytes Relative: 8 %
Neutro Abs: 4.1 K/uL (ref 1.7–7.7)
Neutrophils Relative %: 73 %
Platelet Count: 200 K/uL (ref 150–400)
RBC: 4.65 MIL/uL (ref 4.22–5.81)
RDW: 12.6 % (ref 11.5–15.5)
WBC Count: 5.6 K/uL (ref 4.0–10.5)
nRBC: 0 % (ref 0.0–0.2)

## 2024-02-11 LAB — MAGNESIUM: Magnesium: 2.1 mg/dL (ref 1.7–2.4)

## 2024-02-11 MED ORDER — SODIUM CHLORIDE 0.9 % IV SOLN
40.0000 mg/m2 | Freq: Once | INTRAVENOUS | Status: AC
  Administered 2024-02-11: 74 mg via INTRAVENOUS
  Filled 2024-02-11: qty 74

## 2024-02-11 MED ORDER — SODIUM CHLORIDE 0.9 % IV SOLN: INTRAVENOUS | Status: DC

## 2024-02-11 MED ORDER — SODIUM CHLORIDE 0.9 % IV SOLN
150.0000 mg | Freq: Once | INTRAVENOUS | Status: AC
  Administered 2024-02-11: 150 mg via INTRAVENOUS
  Filled 2024-02-11: qty 150

## 2024-02-11 MED ORDER — CIPROFLOXACIN HCL 500 MG PO TABS: 500.0000 mg | ORAL_TABLET | Freq: Two times a day (BID) | ORAL | 0 refills | Status: DC

## 2024-02-11 MED ORDER — DEXAMETHASONE SOD PHOSPHATE PF 10 MG/ML IJ SOLN
10.0000 mg | Freq: Once | INTRAMUSCULAR | Status: AC
  Administered 2024-02-11: 10 mg via INTRAVENOUS

## 2024-02-11 MED ORDER — POTASSIUM CHLORIDE IN NACL 20-0.9 MEQ/L-% IV SOLN
Freq: Once | INTRAVENOUS | Status: AC
  Filled 2024-02-11: qty 1000

## 2024-02-11 MED ORDER — MAGNESIUM SULFATE 2 GM/50ML IV SOLN
2.0000 g | Freq: Once | INTRAVENOUS | Status: AC
  Administered 2024-02-11: 2 g via INTRAVENOUS
  Filled 2024-02-11: qty 50

## 2024-02-11 MED ORDER — PALONOSETRON HCL INJECTION 0.25 MG/5ML
0.2500 mg | Freq: Once | INTRAVENOUS | Status: AC
  Administered 2024-02-11: 0.25 mg via INTRAVENOUS
  Filled 2024-02-11: qty 5

## 2024-02-11 NOTE — Patient Instructions (Signed)

## 2024-02-11 NOTE — H&P (Signed)
 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 male with medical history of malignant neoplasm of lateral margin of anterior two-thirds of tongue. He had a partial glossectomy and right cervical lymph node dissection in September 2025. He presents today for image guided gastrostomy tube placement to assist with nutritional needs.   Allergies Reviewed:  Patient has no known allergies.   Patient is Full Code  Past Medical History:  Diagnosis Date   Bipolar disorder (HCC)    Depression     Past Surgical History:  Procedure Laterality Date   IR IMAGING GUIDED PORT INSERTION  01/26/2024      Medications: Prior to Admission medications   Medication Sig Start Date End Date Taking? Authorizing Provider  ciprofloxacin  (CIPRO ) 500 MG tablet Take 1 tablet (500 mg total) by mouth 2 (two) times daily. 02/11/24   Pasam, Chinita, MD  dexamethasone  (DECADRON ) 4 MG tablet Take 2 tablets (8 mg) by mouth daily x 3 days starting the day after cisplatin  chemotherapy. Take with food. 01/13/24   Pasam, Chinita, MD  fexofenadine  (ALLEGRA  ALLERGY) 180 MG tablet Take 1 tablet (180 mg total) by mouth daily. 11/02/23   Wendee Lynwood CHRISTELLA, NP  fluticasone  (FLONASE ) 50 MCG/ACT nasal spray Place 2 sprays into both nostrils daily. 11/02/23   Wendee Lynwood CHRISTELLA, NP  gabapentin (NEURONTIN) 100 MG capsule Take 100 mg by mouth 3 (three) times daily.    [provider]  hydrOXYzine  (ATARAX ) 10 MG tablet Take 1-2 tablets (10-20 mg total) by mouth 3 (three) times daily as needed for anxiety. 02/03/24   Wendee Lynwood CHRISTELLA, NP  lidocaine  (XYLOCAINE ) 2 % solution  Patient: Mix 1part 2% viscous lidocaine , 1part H20. Swish & spit 8 mL of diluted mixture up to 12 times a day PRN soreness. May swallow up to 4 times a day if throat is sore. 02/01/24   Izell Domino, MD  lidocaine -prilocaine  (EMLA ) cream Apply to affected area once 01/13/24   Pasam, Avinash, MD  Melatonin 10 MG TABS Take 10 mg by mouth at bedtime.    [provider]  ondansetron  (ZOFRAN ) 8 MG tablet Take 1 tablet (8 mg total) by mouth every 8 (eight) hours as needed for nausea or vomiting. Start on the third day after cisplatin . 01/13/24   Pasam, Avinash, MD  oxyCODONE (ROXICODONE) 5 MG/5ML solution Take 5 mg by mouth every 6 (six) hours as needed. Patient not taking: Reported on 02/11/2024 12/05/23   [provider]  prochlorperazine  (COMPAZINE ) 10 MG tablet Take 1 tablet (10 mg total) by mouth every 6 (six) hours as needed (Nausea or vomiting). 01/13/24   Autumn Chinita, MD     Family History  Problem Relation Age of Onset   Arthritis Mother    Depression Mother    Cancer Paternal Grandmother        skin cancer    Social History   Socioeconomic History   Marital status: Married    Spouse name: Andrez   Number of children: 1   Years of education: Not on file   Highest education level: Not on file  Occupational History   Not on file  Tobacco Use   Smoking status: Never  Smokeless tobacco: Never  Vaping Use   Vaping status: Never Used  Substance and Sexual Activity   Alcohol use: Yes    Comment: socially   Drug use: No   Sexual activity: Yes    Birth control/protection: None  Other Topics Concern   Not on file  Social History Narrative   Fulltime: Chiropractor (4)   [employer] (7)      Hobbies: play music and dirt bikes    Social Drivers of Health   Financial Resource Strain: Low Risk  (01/22/2024)   Overall Financial Resource Strain (CARDIA)    Difficulty of Paying Living Expenses: Not hard at all  Food Insecurity: No Food Insecurity  (01/22/2024)   Hunger Vital Sign    Worried About Running Out of Food in the Last Year: Never true    Ran Out of Food in the Last Year: Never true  Transportation Needs: No Transportation Needs (01/22/2024)   PRAPARE - Administrator, Civil Service (Medical): No    Lack of Transportation (Non-Medical): No  Physical Activity: Not on file  Stress: Not on file  Social Connections: Not on file     Review of Systems: Denies fever, headache, chest pain, dyspnea, cough, back pain, vomiting or bleeding.  He is anxious, has some occasional nausea, some intermittent abdominal discomfort, dysphagia.    Vital Signs: Vitals:   02/12/24 0948  BP: 135/84  Pulse: 82  Resp: 16  Temp: (!) 97.4 F (36.3 C)  SpO2: 98%       Physical Exam: Awake, alert.  Chest clear to auscultation bilaterally.  Heart with regular rate and rhythm.  Abdomen soft, positive bowel sounds, nontender.  No lower extremity edema.  Clean, intact right chest wall Port-A-Cath.  Right sided neck dissection scar present; recent scattered maculopapular rash on the back, upper torso and back of the neck on the right side  Imaging: CT ABDOMEN PELVIS WO CONTRAST Result Date: 02/02/2024 CLINICAL DATA:  Head and neck cancer, preprocedural planning for a peg tube placement EXAM: CT ABDOMEN AND PELVIS WITHOUT CONTRAST TECHNIQUE: Multidetector CT imaging of the abdomen and pelvis was performed following the standard protocol without IV contrast. RADIATION DOSE REDUCTION: This exam was performed according to the departmental dose-optimization program which includes automated exposure control, adjustment of the mA and/or kV according to patient size and/or use of iterative reconstruction technique. COMPARISON:  05/04/2022 FINDINGS: Lower chest: No acute pleural or parenchymal lung disease. Hepatobiliary: Unremarkable unenhanced appearance of the liver and gallbladder. No biliary duct dilation. Pancreas: Unremarkable unenhanced  appearance. Spleen: Unremarkable unenhanced appearance. Adrenals/Urinary Tract: No urinary tract calculi or obstructive uropathy. The adrenals are unremarkable. Bladder is decompressed, limiting its evaluation. Stomach/Bowel: No bowel obstruction or ileus. Normal appendix right lower quadrant. No bowel wall thickening or inflammatory change. The ventral aspect of the distal gastric body and gastric antrum are immediately beneath the anterior abdominal wall, in a position conducive to percutaneous gastrostomy tube placement. Vascular/Lymphatic: No significant vascular findings are present. No enlarged abdominal or pelvic lymph nodes. Reproductive: Prostate is unremarkable. Other: No free fluid or free intraperitoneal gas. No abdominal wall hernia. Musculoskeletal: No acute or destructive bony abnormalities. Reconstructed images demonstrate no additional findings. IMPRESSION: 1. Position of the stomach conducive to placement of percutaneous gastrostomy tube within the distal body or antrum. 2. No acute intra-abdominal or intrapelvic process. Electronically Signed   By: Ozell Daring M.D.   On: 02/02/2024 18:35   IR  IMAGING GUIDED PORT INSERTION Result Date: 01/26/2024 INDICATION: 33 year old with head and neck cancer. Port-A-Cath needed for chemotherapy. EXAM: FLUOROSCOPIC AND ULTRASOUND GUIDED PLACEMENT OF A SUBCUTANEOUS PORT MEDICATIONS: Benadryl  50 mg ANESTHESIA/SEDATION: Moderate (conscious) sedation was employed during this procedure. A total of Versed  5 mg and fentanyl  150 mcg was administered intravenously at the order of the provider performing the procedure. Total intra-service moderate sedation time: 35 minutes. Patient's level of consciousness and vital signs were monitored continuously by radiology nurse throughout the procedure under the supervision of the provider performing the procedure. FLUOROSCOPY TIME:  Radiation Exposure Index (as provided by the fluoroscopic device): 1 mGy Kerma  COMPLICATIONS: None immediate. PROCEDURE: The procedure, risks, benefits, and alternatives were explained to the patient. Questions regarding the procedure were encouraged and answered. The patient understands and consents to the procedure. Patient was placed supine on the interventional table. Ultrasound confirmed a patent right internal jugular vein. Ultrasound image was saved for documentation. The right chest and neck were cleaned with a skin antiseptic and a sterile drape was placed. Maximal barrier sterile technique was utilized including caps, mask, sterile gowns, sterile gloves, sterile drape, hand hygiene and skin antiseptic. The right neck was anesthetized with 1% lidocaine . Small incision was made in the right neck with a blade. Micropuncture set was placed in the right internal jugular vein with ultrasound guidance. The micropuncture wire was used for measurement purposes. The right chest was anesthetized with 1% lidocaine  with epinephrine . #15 blade was used to make an incision and a subcutaneous port pocket was formed. 8 french Power Port was assembled. Subcutaneous tunnel was formed with a stiff tunneling device. The port catheter was brought through the subcutaneous tunnel. The port was placed in the subcutaneous pocket. The micropuncture set was exchanged for a peel-away sheath. The catheter was placed through the peel-away sheath and the tip was positioned at the superior cavoatrial junction. Catheter placement was confirmed with fluoroscopy. The port was accessed and flushed with heparinized saline. The port pocket was closed using two layers of absorbable sutures and Dermabond. The vein skin site was closed using a single layer of absorbable suture and Dermabond. Sterile dressings were applied. Patient tolerated the procedure well without an immediate complication. Ultrasound and fluoroscopic images were taken and saved for this procedure. IMPRESSION: Placement of a subcutaneous power-injectable  port device. Catheter tip at the superior cavoatrial junction. Electronically Signed   By: Juliene Balder M.D.   On: 01/26/2024 18:13    Labs:  CBC: Recent Labs    01/13/24 1411 01/29/24 0801 02/02/24 0926 02/11/24 0822  WBC 6.5 4.2 4.8 5.6  HGB 15.8 14.7 15.4 14.6  HCT 45.5 43.2 44.0 41.7  PLT 256 229 226 200    COAGS: No results for input(s): INR, APTT in the last 8760 hours.  BMP: Recent Labs    01/13/24 1411 01/29/24 0801 02/02/24 0926 02/11/24 0822  NA 139 138 137 139  K 4.1 4.0 4.3 4.0  CL 102 103 101 104  CO2 31 27 28 28   GLUCOSE 92 94 79 117*  BUN 11 11 16 10   CALCIUM 10.2 9.4 9.5 9.2  CREATININE 0.83 0.80 0.78 0.76  GFRNONAA >60 >60 >60 >60    LIVER FUNCTION TESTS: Recent Labs    01/13/24 1411  BILITOT 0.7  AST 18  ALT 13  ALKPHOS 65  PROT 8.1  ALBUMIN 5.0    TUMOR MARKERS: No results for input(s): AFPTM, CEA, CA199, CHROMGRNA in the last 8760 hours.  Assessment  and Plan: Malignant neoplasm of lateral margin of anterior two-thirds of tongue-  Request for image guided gastrostomy tube placement.   Risks and benefits image guided gastrostomy tube placement was discussed with the patient /spouse including, but not limited to the need for a barium enema during the procedure, bleeding, infection, peritonitis and/or damage to adjacent structures.  All of the patient's questions were answered, patient is agreeable to proceed.  Consent signed and in chart.   WBC 12.8- pt has had recent steroids, afebrile  Thank you for allowing our service to participate in Benjamin Choi 's care.    Electronically Signed: Kristi B Davenport, NP/Kevin Skilar Marcou,PA-C   02/11/2024, 3:04 PM     I spent a total of 20 minutes  in face to face in clinical consultation, greater than 50% of which was counseling/coordinating care for image guided gastrostomy tube placement.    (A copy of this note was sent to the referring provider and the time of visit.)

## 2024-02-11 NOTE — Assessment & Plan Note (Addendum)
 Please review oncology history for additional details and timeline of events.  He underwent partial glossectomy with right cervical lymph node dissections on 12/03/23. 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 pT2, pN0, cM0.  His case was discussed in ENT multidisciplinary tumor conference on 01/13/2024.  Because of high risk features including positive margin status, focal positivity for LVI and intratumoral PNI, recommendation made to proceed with adjuvant concurrent chemoradiation.    Previously I discussed recent developments, findings, pathology reports and consensus opinion from ENT tumor conference with the patient and his wife who was accompanying.  They verbalized understanding.  He is in agreement with the plan to proceed with concurrent chemoradiation, given positive margins.  We have discussed about role of cisplatin  being a radiosensitizer in the treatment of head and neck cancer.  We have discussed about the curative intent of chemoradiation for this patient.  Patient is willing to proceed with weekly cisplatin .  Started radiation treatments from 01/27/2024.  Started chemotherapy with cisplatin  from 01/29/2024.  Plan to continue this weekly during the course of radiation.  Due for cycle 3 of cisplatin  today.  Labs reveal no dose-limiting toxicities.  Will proceed with cisplatin  at 40 mg/m dose today.  We will pre-schedule feeding tube placement around the 4th or 5th week of treatment.  Patient prefers to avoid feeding tube, if he can maintain nutrition and hydration.  Previously referrals sent for nutritionist, speech-language pathology, physical therapy for supportive care.  RTC in 1 week for labs, office  visit and continuation of cisplatin .

## 2024-02-11 NOTE — Progress Notes (Signed)
 Benjamin [facility]oi  ONCOLOGY CLINIC PROGRESS NOTE   Patient Care Team: Wendee Lynwood HERO, NP as PCP - General (Nurse Practitioner) Otho Redell Curry, MD as Referring Physician (Otolaryngology) Malmfelt, Delon CROME, RN as Oncology Nurse Navigator Izell Domino, MD as Consulting Physician (Radiation Oncology)  PATIENT NAME: Benjamin Choi   MR#: [id number] DOB: [birth year]  Date of visit: 02/11/2024   ASSESSMENT & PLAN:   Benjamin Choi is a 33 y.o. 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 Focal positive for LVI and intratumoral PNI.  Margins positive.   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) Please review oncology history for additional details and timeline of events.  He underwent partial glossectomy with right cervical lymph node dissections on 12/03/23. 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 pT2, pN0, cM0.  His case was discussed in ENT multidisciplinary tumor conference on 01/13/2024.  Because of high risk features including positive margin status, focal positivity for LVI and intratumoral PNI, recommendation made to proceed with adjuvant concurrent chemoradiation.    Previously I discussed recent developments, findings, pathology reports and consensus opinion from ENT tumor conference with the patient and his wife who was accompanying.  They verbalized understanding.  He is in agreement with the plan to proceed  with concurrent chemoradiation, given positive margins.  We have discussed about role of cisplatin  being a radiosensitizer in the treatment of head and neck cancer.  We have discussed about the curative intent of chemoradiation for this patient.  Patient is willing to proceed with weekly cisplatin .  Started radiation treatments from 01/27/2024.  Started chemotherapy with cisplatin  from 01/29/2024.  Plan to continue this weekly during the course of radiation.  Due for cycle 3 of cisplatin  today.  Labs reveal no dose-limiting toxicities.  Will proceed with cisplatin  at 40 mg/m dose today.  We will pre-schedule feeding tube placement around the 4th or 5th week of treatment.  Patient prefers to avoid feeding tube, if he can maintain nutrition and hydration.  Previously referrals sent for nutritionist, speech-language pathology, physical therapy for supportive care.  RTC in 1 week for labs, office visit and continuation of cisplatin .  Oral mucositis due to radiation therapy Experiencing oral soreness attributed to radiation therapy. Using prescribed mouthwashes and numbing cream for symptom management. - Continue using prescribed mouthwashes and numbing cream.  Skin and soft tissue infection, upper torso Developed bumps with pus tips on back, chest, behind ear, and head, uncertain etiology. Mild itching present. No recent changes in routine or exposure to irritants. Antibiotic treatment initiated to prevent infection. - Prescribed ciprofloxacin 500 mg twice daily for 10 days.  Chemotherapy-induced nausea Experiencing nausea without vomiting. Managed with antiemetic medication and Decadron  post-chemotherapy. - Continue antiemetic medication and Decadron  post-chemotherapy.   I reviewed lab results and outside records for this visit and discussed relevant results with the patient. Diagnosis, plan of care and treatment options were also discussed in detail with the patient. Opportunity provided to  ask questions and answers provided to his apparent satisfaction.  Provided instructions to call our clinic with any problems, questions or concerns prior to return visit. I recommended to continue follow-up with PCP and sub-specialists. He verbalized understanding and agreed with the plan.   NCCN guidelines have been consulted in the planning of this patient's care.  I spent a total of 42 minutes during this encounter with the patient including review of chart and various tests results, discussions about plan of care and coordination of care plan.   Chinita Patten, MD  02/11/2024 12:58 PM  [facility] - A DE[facility] [address] [place] [address] Dept: [phone] Dept Fax: [phone]    CHIEF COMPLAINT/ REASON FOR VISIT:   Invasive squamous cell carcinoma of the right lateral margin of tongue, status post partial glossectomy and right cervical lymph node dissection in September 2025.  Focal positive for LVI and intratumoral PNI.  Margins positive.   Current Treatment: Concurrent chemoradiation with weekly cisplatin  started from 01/27/2024.  INTERVAL HISTORY:    Discussed the use of AI scribe software for clinical note transcription with the patient, who gave verbal consent to proceed.  History of Present Illness  Benjamin Choi is a 33 year old male undergoing chemotherapy who presents with skin bumps and nausea.  He experiences nausea without vomiting during chemotherapy and takes medication to manage it, including Decadron  for three days following sessions. His appetite decreases during chemotherapy, but he is still able to eat.  He has developed bumps on his back, chest, behind his ear, and on his head, which started on Monday, a few days after his second chemotherapy session. Some bumps have pus tips, and there is slight itchiness on the one located on his hairline, though he refrains from scratching  it. No recent changes or insults could have caused the bumps.  He experiences soreness in his mouth and uses prescribed mouthwashes and lidocaine  for relief. He also has numbing cream for his port.  His energy levels are adequate, allowing him to perform daily activities, although he sleeps more in the days following chemotherapy. He maintains a stable weight of 157 pounds. No vomiting, no itching of the bumps except for slight itchiness on the hairline, and no recent changes or insults that could have caused the bumps.    I have reviewed the past medical history, past surgical history, social history and family history with the patient and they are unchanged from previous note.  HISTORY OF PRESENT ILLNESS:   ONCOLOGY HISTORY:   He initially presented to his PCP in December of 2024 with a painful sore on his right lateral tongue. He was given triamcinolone  paste at that time to manage this without relief achieved.  He returned to his PCP in May of 2025 with c/o persistent pain from the lesion and was advised to try magic mouthwash which again did not provide him any relief. He was subsequently advised to follow-up with his dentist who shaved down a sharp molar to help prevent further irritation against his tongue.   Given persistence of the painful tongue lesion, a biopsy was obtained on 10/27/23. Pathology showed findings consistent with keratinizing invasive squamous cell carcinoma with a minute focus of apparent LVI seen in a small vessel near the deep margin (and with tumor present in all margins of the submitted specimen).    Subsequently, the patient was referred to [facility] and was seen in consultation by Dr. Sallyann on 11/10/23  for further management. Oral examination performed at that time confirmed a 2 cm tender, ulcerated right lateral tongue mass. A laryngoscopy was also performed which showed no abnormal findings. Examination of the neck was also negative for cervical lymphadenopathy.     Imaging was advised for further evaluation and he accordingly presented for a soft tissue neck CT and chest CT at [facility] on 11/23/23 which collectively demonstrated: no definite evidence of invasive of local invasion within the soft tissues of the neck, with bilateral submental and jugular chain lymph nodes measure up to 7 mm in thickness, within normal limits. CT findings were also negative for metastatic disease within the chest.    Based on Dr. Elvis recommendations, he opted to proceed with a partial glossectomy with right cervical lymph node dissections on 12/03/23. 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 pT2, pN0, cM0   He did return to the ED on 12/15/23 with c/o bleeding from his mouth. Labs collected in the ED were unremarkable and the bleeding had stopped by the time that he was evaluated in the ED. No intervention was required and he was advised to follow-up with ENT in an OP setting.    Swallowing issues, if any: none   Weight Changes: 5 lbs of weight loss (likely due to diet changes secondary to tongue pain)   Pain status: tongue is numb, was 10/10 pain prior to surgery. Recently bit his tongue and caused a wound in the Right lateral tongue.  Had dental evaluation and no intervention was needed.   Other symptoms: only able to eat soft foods due to initial pain from the lesion (eventually improved to eating chicken and steak); also reported that even drinking water would irritate his tongue    Tobacco history, if any: Former off/on smoker -- quit   ETOH abuse, if any: stopped drinking.   His case was discussed in ENT multidisciplinary tumor conference on 01/13/2024.  Because of high risk features  including positive margin status, focal positivity for LVI and intratumoral PNI, recommendation made to proceed with adjuvant concurrent chemoradiation.  Plan to proceed with weekly cisplatin  during the course of radiation.  Started concurrent chemoradiation with weekly cisplatin  from 01/27/2024.  Oncology History  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)  01/03/2024 Initial Diagnosis   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)   01/13/2024 Cancer Staging   Staging form: Oral Cavity, AJCC 8th Edition - Pathologic: Stage II (pT2, pN0, cM0) - Signed by Autumn Millman, MD on 01/13/2024 Stage prefix: Initial diagnosis Residual tumor (R): R1 Lymph-vascular invasion (LVI): LVI present/identified, NOS Perineural invasion (PNI): Present Subclassification of lymphovascular invasion: Intratumoral   01/29/2024 -  Chemotherapy   Patient is on Treatment Plan : HEAD/NECK Cisplatin  (40) q7d         REVIEW OF SYSTEMS:   Review of Systems - Oncology  All other pertinent systems were reviewed with the patient and are negative.  ALLERGIES: He has no known allergies.  MEDICATIONS:  Current Outpatient Medications  Medication Sig Dispense Refill   ciprofloxacin  (CIPRO ) 500 MG tablet Take 1 tablet (500 mg total) by mouth 2 (two) times daily. 20 tablet 0   dexamethasone  (DECADRON ) 4 MG tablet Take 2  tablets (8 mg) by mouth daily x 3 days starting the day after cisplatin  chemotherapy. Take with food. 30 tablet 1   fexofenadine  (ALLEGRA  ALLERGY) 180 MG tablet Take 1 tablet (180 mg total) by mouth daily. 90 tablet 1   fluticasone  (FLONASE ) 50 MCG/ACT nasal spray Place 2 sprays into both nostrils daily. 48 mL 1   gabapentin (NEURONTIN) 100 MG capsule Take 100 mg by mouth 3 (three) times daily.     hydrOXYzine  (ATARAX ) 10 MG tablet Take 1-2 tablets (10-20 mg total) by mouth 3 (three) times daily as needed for anxiety. 30 tablet 1    lidocaine  (XYLOCAINE ) 2 % solution Patient: Mix 1part 2% viscous lidocaine , 1part H20. Swish & spit 8 mL of diluted mixture up to 12 times a day PRN soreness. May swallow up to 4 times a day if throat is sore. 300 mL 3   lidocaine -prilocaine  (EMLA ) cream Apply to affected area once 30 g 3   Melatonin 10 MG TABS Take 10 mg by mouth at bedtime.     ondansetron  (ZOFRAN ) 8 MG tablet Take 1 tablet (8 mg total) by mouth every 8 (eight) hours as needed for nausea or vomiting. Start on the third day after cisplatin . 30 tablet 1   prochlorperazine  (COMPAZINE ) 10 MG tablet Take 1 tablet (10 mg total) by mouth every 6 (six) hours as needed (Nausea or vomiting). 30 tablet 1   oxyCODONE (ROXICODONE) 5 MG/5ML solution Take 5 mg by mouth every 6 (six) hours as needed. (Patient not taking: Reported on 02/11/2024)     No current facility-administered medications for this visit.   Facility-Administered Medications Ordered in Other Visits  Medication Dose Route Frequency Provider Last Rate Last Admin   0.9 %  sodium chloride  infusion   Intravenous Continuous Juliane Guest, MD 10 mL/hr at 02/11/24 1014 New Bag at 02/11/24 1014   CISplatin  (PLATINOL ) 74 mg in sodium chloride  0.9 % 250 mL chemo infusion  40 mg/m2 (Treatment Plan Recorded) Intravenous Once Larone Kliethermes, MD 324 mL/hr at 02/11/24 1254 74 mg at 02/11/24 1254     VITALS:   Blood pressure 119/71, pulse 88, temperature 98.3 F (36.8 C), resp. rate 18, height 5' 8 (1.727 m), weight 157 lb (71.2 kg), SpO2 99%.  Wt Readings from Last 3 Encounters:  02/11/24 157 lb (71.2 kg)  02/03/24 157 lb 1.9 oz (71.3 kg)  01/29/24 158 lb (71.7 kg)    Body mass index is 23.87 kg/m.    Onc Performance Status - 02/11/24 0912       ECOG Perf Status   ECOG Perf Status Ambulatory and capable of all selfcare but unable to carry out any work activities.  Up and about more than 50% of waking hours      KPS SCALE   KPS % SCORE Cares for self, unable to carry on  normal activity or to do active work           PHYSICAL EXAM:   Physical Exam Constitutional:      General: He is not in acute distress.    Appearance: Normal appearance.  HENT:     Head: Normocephalic and atraumatic.     Mouth/Throat:     Comments: Right lateral partial glossectomy site is healed well without any residual ulceration. There is swelling/nodularity in the floor of mouth that could be reactive.  Cannot exclude gross tumor in oral cavity. Eyes:     Conjunctiva/sclera: Conjunctivae normal.  Neck:     Comments:  Right-sided  neck dissection scar has healed well. Cardiovascular:     Rate and Rhythm: Normal rate and regular rhythm.  Pulmonary:     Effort: Pulmonary effort is normal. No respiratory distress.  Abdominal:     General: There is no distension.  Skin:    Findings: Rash (Scattered, maculopapular rash on the back, upper torso and back of neck on the right side) present.  Neurological:     General: No focal deficit present.     Mental Status: He is alert and oriented to person, place, and time.  Psychiatric:        Mood and Affect: Mood normal.        Behavior: Behavior normal.       LABORATORY DATA:   I have reviewed the data as listed.  Results for orders placed or performed in visit on 02/11/24  Rad Onc Aria Session Summary  Result Value Ref Range   Course ID [id number]    Course Start Date 01/18/2024    Session Number 11    Course First Treatment Date 01/27/2024 12:50 PM    Course Last Treatment Date 02/11/2024  9:53 AM    Course Elapsed Days 15    Reference Point ID [email or web address]_Tongue dp    Reference Point Dosage Given to Date 22 Gy   Reference Point Session Dosage Given 2 Gy   Plan ID HN_R_Tongue    Plan Name HN_R_Tongue    Plan Fractions Treated to Date 11    Plan Total Fractions Prescribed 33    Plan Prescribed Dose Per Fraction 2 Gy   Plan Total Prescribed Dose 66.000000 Gy   Plan Primary Reference Point HN_R_Tongue dp   Results for orders  placed or performed in visit on 02/11/24  Magnesium   Result Value Ref Range   Magnesium  2.1 1.7 - 2.4 mg/dL  Basic Metabolic Panel - Cancer [facility] Only  Result Value Ref Range   Sodium 139 135 - 145 mmol/L   Potassium 4.0 3.5 - 5.1 mmol/L   Chloride 104 98 - 111 mmol/L   CO2 28 22 - 32 mmol/L   Glucose, Bld 117 (H) 70 - 99 mg/dL   BUN 10 6 - 20 mg/dL   Creatinine 9.23 9.38 - 1.24 mg/dL   Calcium 9.2 8.9 - 89.6 mg/dL   GFR, Estimated >39 >39 mL/min   Anion gap 8 5 - 15  CBC with Differential (Cancer [facility] Only)  Result Value Ref Range   WBC Count 5.6 4.0 - 10.5 K/uL   RBC 4.65 4.22 - 5.81 MIL/uL   Hemoglobin 14.6 13.0 - 17.0 g/dL   HCT 58.2 60.9 - 47.9 %   MCV 89.7 80.0 - 100.0 fL   MCH 31.4 26.0 - 34.0 pg   MCHC 35.0 30.0 - 36.0 g/dL   RDW 87.3 88.4 - 84.4 %   Platelet Count 200 150 - 400 K/uL   nRBC 0.0 0.0 - 0.2 %   Neutrophils Relative % 73 %   Neutro Abs 4.1 1.7 - 7.7 K/uL   Lymphocytes Relative 15 %   Lymphs Abs 0.9 0.7 - 4.0 K/uL   Monocytes Relative 8 %   Monocytes Absolute 0.4 0.1 - 1.0 K/uL   Eosinophils Relative 3 %   Eosinophils Absolute 0.2 0.0 - 0.5 K/uL   Basophils Relative 0 %   Basophils Absolute 0.0 0.0 - 0.1 K/uL   Immature Granulocytes 1 %   Abs Immature Granulocytes 0.04 0.00 - 0.07 K/uL      RADIOGRAPHIC  STUDIES:  I have personally reviewed the radiological images as listed and agree with the findings in the report.  CT ABDOMEN PELVIS WO CONTRAST CLINICAL DATA:  Head and neck cancer, preprocedural planning for a peg tube placement  EXAM: CT ABDOMEN AND PELVIS WITHOUT CONTRAST  TECHNIQUE: Multidetector CT imaging of the abdomen and pelvis was performed following the standard protocol without IV contrast.  RADIATION DOSE REDUCTION: This exam was performed according to the departmental dose-optimization program which includes automated exposure control, adjustment of the mA and/or kV according to patient size and/or use of iterative  reconstruction technique.  COMPARISON:  05/04/2022  FINDINGS: Lower chest: No acute pleural or parenchymal lung disease.  Hepatobiliary: Unremarkable unenhanced appearance of the liver and gallbladder. No biliary duct dilation.  Pancreas: Unremarkable unenhanced appearance.  Spleen: Unremarkable unenhanced appearance.  Adrenals/Urinary Tract: No urinary tract calculi or obstructive uropathy. The adrenals are unremarkable. Bladder is decompressed, limiting its evaluation.  Stomach/Bowel: No bowel obstruction or ileus. Normal appendix right lower quadrant. No bowel wall thickening or inflammatory change.  The ventral aspect of the distal gastric body and gastric antrum are immediately beneath the anterior abdominal wall, in a position conducive to percutaneous gastrostomy tube placement.  Vascular/Lymphatic: No significant vascular findings are present. No enlarged abdominal or pelvic lymph nodes.  Reproductive: Prostate is unremarkable.  Other: No free fluid or free intraperitoneal gas. No abdominal wall hernia.  Musculoskeletal: No acute or destructive bony abnormalities. Reconstructed images demonstrate no additional findings.  IMPRESSION: 1. Position of the stomach conducive to placement of percutaneous gastrostomy tube within the distal body or antrum. 2. No acute intra-abdominal or intrapelvic process.  Electronically Signed   By: Ozell Daring M.D.   On: 02/02/2024 18:35    CODE STATUS:  Code Status History     Date Active Date Inactive Code Status Order ID Comments User Context   01/26/2024 1510 01/27/2024 0527 Full Code [id number]  Philip Cornet, MD HOV    Questions for Most Recent Historical Code Status (Order [id number])     Question Answer   By: Consent: discussion documented in EHR            No orders of the defined types were placed in this encounter.    Future Appointments  Date Time Provider Department [facility]  02/12/2024  9:15 AM  [facility] [id number] [facility] None  02/12/2024  9:30 AM [facility] ROOM [facility] None  02/12/2024 11:30 AM [facility]-IR 1 [facility]  02/15/2024  9:15 AM [facility] [id number] [facility] None  02/15/2024  9:30 AM [facility] None  02/16/2024  9:15 AM [facility] [id number] [facility] None  02/17/2024  9:15 AM [facility] [id number] [facility] None  02/17/2024 10:00 AM Breedlove Blue, Blaire L, PT [facility] None  02/18/2024  9:15 AM [facility] [id number] [facility] None  02/19/2024  8:15 AM [facility] FLUSH [facility] None  02/19/2024  8:45 AM Suzzanne Brunkhorst, MD [facility] None  02/19/2024  9:15 AM [facility] [id number] [facility] None  02/19/2024 10:00 AM [facility] INFUSION [facility] None  02/19/2024  1:45 PM Ivonne Harlene RAMAN, RD [facility] None  02/22/2024  9:15 AM [facility] [id number] [facility] None  02/23/2024  9:15 AM [facility] [id number] [facility] None  02/24/2024  9:15 AM [facility] [id number] [facility] None  02/25/2024  8:00 AM [facility] FLUSH [facility] None  02/25/2024  8:30 AM Undine Nealis, MD [facility] None  02/25/2024  9:15 AM [facility] [id number] [facility] None  02/26/2024  9:15 AM [facility] [id number] [facility] None  02/26/2024 10:00 AM [facility] INFUSION [facility] None  02/26/2024  12:00 PM Ivonne Harlene RAMAN, RD [facility] None  02/29/2024  9:15 AM [facility] [id number] [facility] None  03/01/2024  9:15 AM [facility] [id number] [facility] None  03/02/2024  8:30 AM [facility] FLUSH [facility] None  03/02/2024  9:00 AM Shamari Lofquist, MD [facility] None  03/02/2024  9:15 AM [facility] [id number] [facility] None  03/04/2024  7:30 AM [facility] INFUSION [facility] None  03/04/2024  9:45 AM Ivonne Harlene RAMAN, RD [facility] None  03/07/2024  9:15 AM [facility] [id number] [facility] None  03/08/2024  9:15 AM [facility] [id number] [facility] None  03/09/2024  9:15 AM [facility] [id number] [facility] None  03/11/2024   8:45 AM [facility] FLUSH [facility] None  03/11/2024  9:15 AM [facility] [id number] [facility] None  03/11/2024 10:00 AM [facility] INFUSION [facility] None  03/11/2024 11:15 AM Ivonne Harlene RAMAN, RD [facility] None  03/14/2024  9:15 AM [facility] [id number] [facility] None  03/15/2024  9:15 AM [facility] [id number] [facility] None  03/16/2024  9:15 AM [facility] [id number] [facility] None  03/17/2024  9:15 AM [facility] [id number] [facility] None      This document was completed utilizing speech recognition software. Grammatical errors, random word insertions, pronoun errors, and incomplete sentences are an occasional consequence of this system due to software limitations, ambient noise, and hardware issues. Any formal questions or concerns about the content, text or information contained within the body of this dictation should be directly addressed to the provider for clarification.

## 2024-02-11 NOTE — Progress Notes (Signed)
 Nutrition follow-up completed with patient and wife during infusion for SCC of the right two thirds of tongue.  Patient is status post partial glossectomy September 2025 at [facility].  He receives concurrent chemoradiation therapy with weekly cisplatin  and is under the care of Dr. Izell and Dr. Autumn.  G-tube placement: Scheduled for December 5.  DME: Not applicable  Weight: 157 pounds December for 157 pounds November 26 155 pounds November 18  Labs include glucose 117  Estimated nutrition needs: 2365-2580 cal, 93-115 g protein, greater than 2 liters fluid.  Patient denies nausea and vomiting because he is able to take his antiemetics.  Reports good appetite and good oral intake.  States he eats as much as he can at any given time.  He has not been drinking any oral nutrition supplements because he has been too full after eating food.  He continues to drink increased water and estimates 100 ounces daily.  Beginning to note more mouth soreness.  Reports an episode of constipation and is taking MiraLAX as needed.  He is using baking soda and salt water gargles and finds them very helpful.  Nutrition diagnosis: Predicted sub-optimal energy intake continues.  Intervention: Encourage patient to try some homemade smoothies/shakes. Continue to eat foods as tolerated/desired. Continue baking soda and salt water gargles. Incorporate regular bowel regimen.  Monitoring, evaluation, goals: Patient will tolerate adequate calories and protein to minimize weight loss.  Next visit: Friday, December 12 during infusion with Elvie.  **Disclaimer: This note was dictated with voice recognition software. Similar sounding words can inadvertently be transcribed and this note may contain transcription errors which may not have been corrected upon publication of note.**

## 2024-02-12 ENCOUNTER — Ambulatory Visit (HOSPITAL_COMMUNITY)
Admission: RE | Admit: 2024-02-12 | Discharge: 2024-02-12 | Disposition: A | Source: Ambulatory Visit | Attending: Oncology

## 2024-02-12 ENCOUNTER — Ambulatory Visit
Admission: RE | Admit: 2024-02-12 | Discharge: 2024-02-12 | Disposition: A | Source: Ambulatory Visit | Attending: Radiation Oncology

## 2024-02-12 ENCOUNTER — Encounter (HOSPITAL_COMMUNITY): Payer: Self-pay

## 2024-02-12 ENCOUNTER — Other Ambulatory Visit (HOSPITAL_COMMUNITY): Payer: Self-pay | Admitting: Interventional Radiology

## 2024-02-12 ENCOUNTER — Telehealth: Payer: Self-pay

## 2024-02-12 ENCOUNTER — Other Ambulatory Visit: Payer: Self-pay

## 2024-02-12 ENCOUNTER — Ambulatory Visit (HOSPITAL_COMMUNITY)
Admission: RE | Admit: 2024-02-12 | Discharge: 2024-02-12 | Disposition: A | Discharge status: Home / Self Care | Source: Ambulatory Visit | Attending: Oncology | Admitting: Oncology

## 2024-02-12 DIAGNOSIS — C023 Malignant neoplasm of anterior two-thirds of tongue, part unspecified: Secondary | ICD-10-CM | POA: Insufficient documentation

## 2024-02-12 DIAGNOSIS — Z510 Encounter for antineoplastic radiation therapy: Secondary | ICD-10-CM | POA: Diagnosis not present

## 2024-02-12 DIAGNOSIS — R1310 Dysphagia, unspecified: Secondary | ICD-10-CM | POA: Insufficient documentation

## 2024-02-12 DIAGNOSIS — Z01812 Encounter for preprocedural laboratory examination: Secondary | ICD-10-CM

## 2024-02-12 DIAGNOSIS — C760 Malignant neoplasm of head, face and neck: Secondary | ICD-10-CM | POA: Diagnosis not present

## 2024-02-12 HISTORY — PX: IR GASTROSTOMY TUBE MOD SED: IMG625

## 2024-02-12 LAB — CBC
HCT: 42.8 % (ref 39.0–52.0)
Hemoglobin: 14.8 g/dL (ref 13.0–17.0)
MCH: 31.6 pg (ref 26.0–34.0)
MCHC: 34.6 g/dL (ref 30.0–36.0)
MCV: 91.5 fL (ref 80.0–100.0)
Platelets: 219 K/uL (ref 150–400)
RBC: 4.68 MIL/uL (ref 4.22–5.81)
RDW: 12.8 % (ref 11.5–15.5)
WBC: 12.8 K/uL — ABNORMAL HIGH (ref 4.0–10.5)
nRBC: 0 % (ref 0.0–0.2)

## 2024-02-12 LAB — RAD ONC ARIA SESSION SUMMARY
Course Elapsed Days: 16
Plan Fractions Treated to Date: 12
Plan Prescribed Dose Per Fraction: 2 Gy
Plan Total Fractions Prescribed: 33
Plan Total Prescribed Dose: 66 Gy
Reference Point Dosage Given to Date: 24 Gy
Reference Point Session Dosage Given: 2 Gy
Session Number: 12

## 2024-02-12 LAB — BASIC METABOLIC PANEL WITH GFR
Anion gap: 12 (ref 5–15)
BUN: 10 mg/dL (ref 6–20)
CO2: 24 mmol/L (ref 22–32)
Calcium: 9.2 mg/dL (ref 8.9–10.3)
Chloride: 103 mmol/L (ref 98–111)
Creatinine, Ser: 0.69 mg/dL (ref 0.61–1.24)
GFR, Estimated: >60 mL/min (ref >60)
Glucose, Bld: 115 mg/dL — ABNORMAL HIGH (ref 70–99)
Potassium: 3.8 mmol/L (ref 3.5–5.1)
Sodium: 139 mmol/L (ref 135–145)

## 2024-02-12 LAB — PROTIME-INR
INR: 1 (ref 0.8–1.2)
Prothrombin Time: 13.6 s (ref 11.4–15.2)

## 2024-02-12 MED ORDER — CEFAZOLIN SODIUM-DEXTROSE 2-4 GM/100ML-% IV SOLN
INTRAVENOUS | Status: AC | PRN
  Administered 2024-02-12: 2 g via INTRAVENOUS

## 2024-02-12 MED ORDER — SODIUM CHLORIDE 0.9 % IV SOLN: INTRAVENOUS | Status: DC

## 2024-02-12 MED ORDER — DIPHENHYDRAMINE HCL 50 MG/ML IJ SOLN
INTRAMUSCULAR | Status: AC | PRN
  Administered 2024-02-12: 50 mg via INTRAVENOUS

## 2024-02-12 MED ORDER — LIDOCAINE-EPINEPHRINE 1 %-1:100000 IJ SOLN
INTRAMUSCULAR | Status: AC
  Filled 2024-02-12: qty 1

## 2024-02-12 MED ORDER — GLUCAGON HCL RDNA (DIAGNOSTIC) 1 MG IJ SOLR
INTRAMUSCULAR | Status: AC | PRN
  Administered 2024-02-12: .5 mg via INTRAVENOUS

## 2024-02-12 MED ORDER — HYDROCODONE-ACETAMINOPHEN 5-325 MG PO TABS: 1.0000 | ORAL_TABLET | ORAL | Status: DC | PRN

## 2024-02-12 MED ORDER — MIDAZOLAM HCL (PF) 2 MG/2ML IJ SOLN
INTRAMUSCULAR | Status: AC | PRN
  Administered 2024-02-12: .5 mg via INTRAVENOUS
  Administered 2024-02-12 (×2): 1 mg via INTRAVENOUS
  Administered 2024-02-12: .5 mg via INTRAVENOUS

## 2024-02-12 MED ORDER — ONDANSETRON HCL 4 MG/2ML IJ SOLN
INTRAMUSCULAR | Status: AC
  Filled 2024-02-12: qty 2

## 2024-02-12 MED ORDER — MIDAZOLAM HCL 2 MG/2ML IJ SOLN
INTRAMUSCULAR | Status: AC
  Filled 2024-02-12: qty 2

## 2024-02-12 MED ORDER — IOHEXOL 300 MG/ML  SOLN
50.0000 mL | Freq: Once | INTRAMUSCULAR | Status: AC | PRN
  Administered 2024-02-12: 15 mL

## 2024-02-12 MED ORDER — FENTANYL CITRATE (PF) 100 MCG/2ML IJ SOLN
INTRAMUSCULAR | Status: AC
  Filled 2024-02-12: qty 2

## 2024-02-12 MED ORDER — LIDOCAINE-EPINEPHRINE 1 %-1:100000 IJ SOLN
20.0000 mL | Freq: Once | INTRAMUSCULAR | Status: AC
  Administered 2024-02-12: 20 mL via INTRADERMAL

## 2024-02-12 MED ORDER — CEFAZOLIN SODIUM-DEXTROSE 2-4 GM/100ML-% IV SOLN
INTRAVENOUS | Status: AC
  Filled 2024-02-12: qty 100

## 2024-02-12 MED ORDER — CEFAZOLIN SODIUM-DEXTROSE 2-4 GM/100ML-% IV SOLN: 2.0000 g | INTRAVENOUS | Status: DC

## 2024-02-12 MED ORDER — LIDOCAINE VISCOUS HCL 2 % MT SOLN
OROMUCOSAL | Status: AC
  Filled 2024-02-12: qty 15

## 2024-02-12 MED ORDER — DIPHENHYDRAMINE HCL 50 MG/ML IJ SOLN
INTRAMUSCULAR | Status: AC
  Filled 2024-02-12: qty 1

## 2024-02-12 MED ORDER — GLUCAGON HCL RDNA (DIAGNOSTIC) 1 MG IJ SOLR
INTRAMUSCULAR | Status: AC
  Filled 2024-02-12: qty 1

## 2024-02-12 MED ORDER — FENTANYL CITRATE (PF) 100 MCG/2ML IJ SOLN
INTRAMUSCULAR | Status: AC | PRN
  Administered 2024-02-12 (×3): 50 ug via INTRAVENOUS

## 2024-02-12 MED ORDER — ONDANSETRON HCL 4 MG/2ML IJ SOLN
INTRAMUSCULAR | Status: AC | PRN
  Administered 2024-02-12: 4 mg via INTRAVENOUS

## 2024-02-12 NOTE — Discharge Instructions (Addendum)
 Please call Interventional Radiology clinic [phone] with any questions or concerns.  Liquid diet for 24 hours.  Clamp G-tube at 2:30 today   PEG Tube Home Guide A PEG tube is used to put food, medicine, and fluids into the stomach. Before you leave the hospital, make sure that you know: How to care for your PEG tube. How to care for the opening (stoma) in your belly. How to give yourself a feeding. How to give yourself medicines. When to call your doctor for help. Supplies needed: Soapy water. Clean, plain water. Clean washcloth. Bandage (dressing). This is optional. Syringe. How to care for a PEG tube Check your PEG tube every day. Make sure: It is not too tight. It is in the correct place. There is a mark on the tube that shows when the tube is in the correct place. Adjust the tube if you need to. Cleaning your stoma Clean your stoma every day. Follow these steps: Wash your hands with soap and water for at least 20 seconds. If you cannot use soap and water, use hand sanitizer. Check your stoma. Let your doctor know if there is: Redness. Swelling. Leaking. Skin irritation. Wash the stoma gently with warm, soapy water. Rinse the stoma with plain water. Pat the stoma area dry. Place a bandage over the stoma if your doctor told you to do that.  Giving yourself a feeding  Your doctor will tell you: How much nutrition and fluid you will need for each feeding. How often to have a feeding. Whether you should take medicine in the tube by itself or with a feeding. To give yourself a feeding, follow these steps: [place] out all of the things that you will need. Make sure that the nutritional formula is at room temperature. Wash your hands with soap and water for at least 20 seconds. If you cannot use soap and water, use hand sanitizer. Sit up or stand up straight. You will need to stay sitting up or standing up while you give yourself a feeding. Make sure the syringe plunger is  pushed in. Place the tip of the syringe in clean water, and slowly pull the plunger to bring (draw up) the water into the syringe. Remove the clamp and the cap from the PEG tube. Push the water out of the syringe to clean (flush) the tube. If the tube is clear, draw up the formula into the syringe. Make sure to use the right amount for each feeding. Add water if you need to. Slowly push the formula from the syringe through the tube. After the feeding, flush the tube with water. Put the clamp and the cap on the tube. Stay sitting up or standing up straight for at least 30 minutes. Use the feeding tube equipment, such as syringes and connectors, as told by your doctor. Giving yourself medicine To give yourself medicine, follow these steps: Lay out all of the things that you will need. If your medicine is in tablet form, crush it and dissolve it in water. Wash your hands with soap and water for at least 20 seconds. If you cannot use soap and water, use hand sanitizer. Sit up or stand up straight. You will need to stay sitting up or standing up while you give yourself medicine. Make sure the syringe plunger is pushed in. Place the tip of the syringe in clean water, and slowly pull the plunger to bring (draw up) the water into the syringe. Remove the clamp and the cap from the  PEG tube. Push the water out of the syringe to clean (flush) the tube. If the tube is clear, draw up the medicine into the syringe. Slowly push the medicine from the syringe through the tube. Flush the tube with water. Put the clamp and the cap on the tube. Stay sitting up or standing up straight for at least 30 minutes. Do not take sustained release (SR) medicines through your tube. If you are not sure if your medicine is an SR medicine, ask your doctor. Contact a doctor if: The area around your stoma is sore, irritated, or red. You have belly pain or bloating while you are feeding or after you feed. You feel like you  may vomit (nauseous) and the feeling will not go away. You have trouble pooping (constipation) or you have watery poop (diarrhea) for a long time. You have a fever. You have problems with your PEG tube. Get help right away if: Your tube is blocked. Your tube falls out. You have pain around your stoma. You are bleeding from your stoma. Your tube is leaking. You choke or you have trouble breathing while you are feeding or after you feed. Summary A PEG tube is used to put food and fluids into the stomach. Before you leave the hospital, you will be taught how to use and care for your PEG tube. Your doctor will give you instructions on how to give yourself feedings and medicines. Contact your doctor if you have a fever or soreness, redness, or irritation around your stoma. Get help right away if your tube leaks, is blocked, or falls out. Also, get help right away if you have pain or bleeding around your stoma. This information is not intended to replace advice given to you by your health care provider. Make sure you discuss any questions you have with your health care provider. Document Revised: 02/15/2020 Document Reviewed: 07/08/2019 Elsevier Patient Education  2023 [employer]     Using and Caring for your PEG (Feeding Tube)  PEG Use - Gravity Flush/Feed   When NOT using for nutritional supplement or medication flush daily with 60 cc water.   When using for nutritional supplement or medication flush with 60 cc water BEFORE and AFTER use.   Do not use well water.  Procedure to Change Dressing   Change dressing daily to keep insertion site clean and to check for signs of infection:  o Redness around insertion site  o Greenish foul-smelling discharge at insertion site  o Temperature greater than 100.4 degrees F   Steps  1. Change dressing after showering.  2. When showering, lather and rinse as usual but avoid direct spray to PEG site.  3. Remove old dressing,  throw into trash.  4. Wash hands with warm soapy water.  5. Dip Q-tip into warm, soapy water. Gently insert Q-tip under retention ring up to tube insertion. Draw "around the clock one time" to remove discharge, dried blood. Repeat as necessary using a clean Q-tip each time.  6. Dip Q-tip into clean warm water. Gently insert Q-tip under retention ring up to tube insertion. Draw "around the clock one time" to rinse. Repeat as necessary using a clean Q-tip each time.  7. Insert dry Q-tip under retention ring. Draw "around the clock one time" to dry. Repeat as necessary with clean Q-tip.  8. Place single split gauze under retention ring, snug up to PEG tube. Secure PEG tube with tape OR mesh brief.  Starter Kit   Contains:  o 3 syringes  o 1 box 4x4 split gauze  o 1 role of paper tape  o 1 package of mesh briefs  o 1 case of Osmolite 1.5 nutritional supplement  Use your PEG for nutritional supplementation as directed by your Dietician.   Moderate Conscious Sedation, Adult, Care After This sheet gives you information about how to care for yourself after your procedure. Your health care provider may also give you more specific instructions. If you have problems or questions, contact your health care provider. What can I expect after the procedure? After the procedure, it is common to have: Sleepiness for several hours. Impaired judgment for several hours. Difficulty with balance. Vomiting if you eat too soon. Follow these instructions at home: For the time period you were told by your health care provider: Rest. Do not participate in activities where you could fall or become injured. Do not drive or use machinery. Do not drink alcohol. Do not take sleeping pills or medicines that cause drowsiness. Do not make important decisions or sign legal documents. Do not take care of children on your own.      Eating and drinking Follow the diet recommended by your health care  provider. Drink enough fluid to keep your urine pale yellow. If you vomit: Drink water, juice, or soup when you can drink without vomiting. Make sure you have little or no nausea before eating solid foods.   General instructions Take over-the-counter and prescription medicines only as told by your health care provider. Have a responsible adult stay with you for the time you are told. It is important to have someone help care for you until you are awake and alert. Do not smoke. Keep all follow-up visits as told by your health care provider. This is important. Contact a health care provider if: You are still sleepy or having trouble with balance after 24 hours. You feel light-headed. You keep feeling nauseous or you keep vomiting. You develop a rash. You have a fever. You have redness or swelling around the IV site. Get help right away if: You have trouble breathing. You have new-onset confusion at home. Summary After the procedure, it is common to feel sleepy, have impaired judgment, or feel nauseous if you eat too soon. Rest after you get home. Know the things you should not do after the procedure. Follow the diet recommended by your health care provider and drink enough fluid to keep your urine pale yellow. Get help right away if you have trouble breathing or new-onset confusion at home. This information is not intended to replace advice given to you by your health care provider. Make sure you discuss any questions you have with your health care provider. Document Revised: 06/24/2019 Document Reviewed: 01/20/2019 Elsevier Patient Education  2021 [employer].

## 2024-02-12 NOTE — Telephone Encounter (Signed)
 Returned patient's call regarding if he should take Ibuprofen  for his G-tube insertion pain that was placed this morning. Patient was advised to stay away from Ibuprofen  indefinitely and education was provided as to why this is important. Patient was also recently prescribed Ciprofloxacin  by Dr. Autumn. Patient wanted to hold off taking this for bumps he has on his skin. Dr. Pasam agreed to hold off starting this until next week. All instructions given to patient who understood and agreed.

## 2024-02-12 NOTE — Procedures (Signed)
 Vascular [facility] Interventional [facility]iology Procedure Note  Patient: Benjamin Choi DOB: 04-Nov-1990 Medical Record Number: [id number] Note Date/Time: 02/12/24 10:48 AM   Performing Physician: Thom Hall, MD Assistant(s): None  Diagnosis: Head and Neck CA  Procedure: PERCUTANEOUS GASTROSTOMY TUBE PLACEMENT  Anesthesia: Conscious Sedation Complications: None Estimated Blood Loss: Minimal  Findings:  Successful placement of a 65F gastrostomy tube under fluoroscopy.  Plan: G-tube to gravity drainage bag x 2 hrs Liquid diet x 24 hrs OK to cap G-tube for 2 hours post liquid meal. OK for meds per tube post procedure. OK to begin TFs and G-tube use in 4 hrs, taper from trickle to goal as tolerated.   Pt is to return to [facility] for routine G-tube exchange in 6 months.  See detailed procedure note with images in PACS. The patient tolerated the procedure well without incident or complication and was returned to [facility] in stable condition.    Thom Hall, MD [facility]   Pager. [phone] Clinic. [phone]

## 2024-02-14 ENCOUNTER — Other Ambulatory Visit: Payer: Self-pay

## 2024-02-15 ENCOUNTER — Other Ambulatory Visit: Payer: Self-pay

## 2024-02-15 ENCOUNTER — Ambulatory Visit
Admission: RE | Admit: 2024-02-15 | Discharge: 2024-02-15 | Disposition: A | Source: Ambulatory Visit | Attending: Radiation Oncology

## 2024-02-15 ENCOUNTER — Ambulatory Visit: Admission: RE | Admit: 2024-02-15 | Discharge: 2024-02-15 | Attending: Radiation Oncology

## 2024-02-15 DIAGNOSIS — Z510 Encounter for antineoplastic radiation therapy: Secondary | ICD-10-CM | POA: Diagnosis not present

## 2024-02-15 LAB — RAD ONC ARIA SESSION SUMMARY
Course Elapsed Days: 19
Plan Fractions Treated to Date: 13
Plan Prescribed Dose Per Fraction: 2 Gy
Plan Total Fractions Prescribed: 33
Plan Total Prescribed Dose: 66 Gy
Reference Point Dosage Given to Date: 26 Gy
Reference Point Session Dosage Given: 2 Gy
Session Number: 13

## 2024-02-16 ENCOUNTER — Telehealth: Payer: Self-pay | Admitting: Radiation Oncology

## 2024-02-16 ENCOUNTER — Ambulatory Visit

## 2024-02-16 NOTE — Telephone Encounter (Signed)
 12/9 Received call from patient to cancel treatment appt on today due to the weather.  Email sent to L2 machine and copied Support RTT, so they are aware.

## 2024-02-17 ENCOUNTER — Other Ambulatory Visit: Payer: Self-pay

## 2024-02-17 ENCOUNTER — Ambulatory Visit
Admission: RE | Admit: 2024-02-17 | Discharge: 2024-02-17 | Disposition: A | Discharge status: Home / Self Care | Source: Ambulatory Visit | Attending: Radiation Oncology | Admitting: Radiation Oncology

## 2024-02-17 ENCOUNTER — Ambulatory Visit: Payer: Self-pay | Attending: Radiation Oncology | Admitting: Physical Therapy

## 2024-02-17 ENCOUNTER — Encounter: Payer: Self-pay | Admitting: Physical Therapy

## 2024-02-17 DIAGNOSIS — C023 Malignant neoplasm of anterior two-thirds of tongue, part unspecified: Secondary | ICD-10-CM | POA: Diagnosis not present

## 2024-02-17 DIAGNOSIS — M25511 Pain in right shoulder: Secondary | ICD-10-CM

## 2024-02-17 DIAGNOSIS — M25611 Stiffness of right shoulder, not elsewhere classified: Secondary | ICD-10-CM | POA: Diagnosis not present

## 2024-02-17 DIAGNOSIS — R293 Abnormal posture: Secondary | ICD-10-CM

## 2024-02-17 DIAGNOSIS — Z510 Encounter for antineoplastic radiation therapy: Secondary | ICD-10-CM | POA: Diagnosis not present

## 2024-02-17 LAB — RAD ONC ARIA SESSION SUMMARY
Course Elapsed Days: 21
Plan Fractions Treated to Date: 14
Plan Prescribed Dose Per Fraction: 2 Gy
Plan Total Fractions Prescribed: 33
Plan Total Prescribed Dose: 66 Gy
Reference Point Dosage Given to Date: 28 Gy
Reference Point Session Dosage Given: 2 Gy
Session Number: 14

## 2024-02-17 NOTE — Therapy (Addendum)
 OUTPATIENT PHYSICAL THERAPY HEAD AND NECK BASELINE TREATMENT   Patient Name: Benjamin Choi MRN: [id number] DOB:1991/01/30, 33 y.o., male Today's Date: 02/17/2024  END OF SESSION:  PT End of Session - 02/17/24 1014     Visit Number 2    Number of Visits 7    Date for Recertification  03/30/24    PT Start Time 1013    PT Stop Time 1111    PT Time Calculation (min) 58 min    Activity Tolerance Patient tolerated treatment well    Behavior During Therapy [facility] for tasks assessed/performed          Past Medical History:  Diagnosis Date   Bipolar disorder (HCC)    Depression    Past Surgical History:  Procedure Laterality Date   IR GASTROSTOMY TUBE MOD SED  02/12/2024   IR IMAGING GUIDED PORT INSERTION  01/26/2024   Patient Active Problem List   Diagnosis Date Noted   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) 01/03/2024   Anxiety 11/02/2023   Tongue cancer (HCC) 11/02/2023   Radicular pain in left arm 04/22/2023   Allergic rhinitis 04/22/2023   Tongue lesion 02/16/2023   Puncture wound 12/15/2022   Screening for lipid disorders 06/24/2022   Preventative health care 06/24/2022   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 He presented to his PCP in Dec 2024 with a painful sore on his right lateral  tongue. He was given triamcinolone  paste without relief and returned May 2025 with persistent pain from the lesion and tried magic mouthwash again without relief. He saw a dentist who shaved down a sharp molar to prevent irritation to his tongue. Given persistent pain to the tongue lesion a biopsy was done 10/27/23 which showed findings consistent with keratinizing invasive SCC. He was referred to [facility]. 11/10/23 Consult with Dr. Sallyann. Exam at that time confirmed a 2 cm tender ulcerated right lateral tongue mass. Laryngoscopy negative and neck negative for cervical lymphadenopathy. 11/22/13 CT neck/chest at [facility] which collectively demonstrated: no definite evidence of invasive of local invasion within the soft tissues of the neck, with bilateral submental and jugular chain lymph nodes measure up to 7 mm in thickness, within normal limits. CT findings were also negative for metastatic disease within the chest.12/03/23 he opted to proceed with a partial glossectomy with right cervical lymph node dissections on 12/03/23. Pathology from the procedure showed: tumor the size of 1.3 cm; histology of moderately differentiated invasive squamous cell carcinoma of the tongue (keratinizing) invading a depth of 0.8 cm; positive for focal LVI and intratumoral PNI; Margin positive, nodal status of 29/29 excised lymph nodes negative for carcinoma. He will receive 33 fractions of radiation to his right tongue and right neck with weekly chemotherapy which started on 01/27/24 and will complete 03/17/24.  PATIENT GOALS:   to be educated  about the signs and symptoms of lymphedema and learn post op HEP.   PAIN:  Are you having pain? Yes: NPRS scale: 5/10 Pain location: feeding tube site Pain description: sharp Aggravating factors: using abs, when stomach growls Relieving factors: keeping food on stomach  PRECAUTIONS: Active CA  RED FLAGS: None   WEIGHT BEARING RESTRICTIONS: No  FALLS:  Has patient fallen in last 6  months? No Does the patient have a fear of falling that limits activity? No Is the patient reluctant to leave the house due to a fear of falling?No  LIVING ENVIRONMENT: Patient lives with: wife and kids aged 31 and 7 Lives in: House/apartment Has following equipment at home: None  OCCUPATION: on medical leave- retail banker   LEISURE: outside in yard daily, rides bikes (dirt bikes, mountain bikes), plays ball with kids  PRIOR LEVEL OF FUNCTION: Independent   OBJECTIVE: Note: Objective measures were completed at Evaluation unless otherwise noted.  COGNITION: Overall cognitive status: Within functional limits for tasks assessed                  POSTURE:  Forward head and rounded shoulders posture  30 SEC SIT TO STAND: 20 reps in 30 sec without use of UEs which is  Poor for patient's age. Pt reports he felt he could have completed more.   SHOULDER AROM:   Impaired  R shoulder limited post neck dissection surgery - will measure at next session   CERVICAL AROM:   Percent limited  Flexion WFL  Extension WFL  Right lateral flexion 25% limited  Left lateral flexion 35% limited  Right rotation WFL  Left rotation WFL    (Blank rows=not tested)  UPPER EXTREMITY AROM/PROM:  A/PROM RIGHT   eval   Shoulder extension 68  Shoulder flexion 128  Shoulder abduction 75  Shoulder internal rotation Unable   Shoulder external rotation unable    (Blank rows = not tested)  A/PROM LEFT   eval  Shoulder extension 70  Shoulder flexion 144  Shoulder abduction 157  Shoulder internal rotation 65  Shoulder external rotation 85    (Blank rows = not tested)  Neck Disability Index score: 19 / 50 = 38.0 %  TREATMENT PERFORMED: 02/17/24: Measured bialteral shoulder ROM  Therapeutic Exercise: Pulleys x 2 min in direction of flexion and 2 min in direction of abduction with v/c to keep shoulder relaxed  Therapeutic Activity: Ball up wall x 10 reps in direction of flexion with v/c  and t/c to keep scapular muscle engaged throughout and then 5 reps in to R shoulder abduction - stopped due to pain in joint L sidelying: R shoulder abduction with therapist manual assist to keep shoulder from going forward with v/c to keep scapular muscles engaged x 10 with the last few pt able to complete without therapist assist with good control, then R shoulder flexion x 10 reps with initial manual assist but then pt able to complete without assist with good control, ER with towel under elbow x 5 reps with 2 lbs and then 5 with 1 lb (decreased due to discomfort at feeding tube) Seated: scapular retraction with red band x 10 with good activation of scapular muscles Shoulder shrugs with 2 lbs with initial t/c from therapist for shoulder positioning but then pt able to control shoulder x 10 reps  PATIENT EDUCATION:  Education details: how to control scapular muscles to avoid compensation to decrease pain, new HEP Person educated: Patient Education method: Programmer, Multimedia, Facilities Manager, Handout Education  comprehension: Patient verbalized understanding and returned demonstration  HOME EXERCISE PROGRAM: Neck ROM exercises   Access Code: [email or web address] URL: [email or web address] Date: 02/17/2024 Prepared by: Florina Lanis Carbon  Exercises - Sidelying Shoulder External Rotation (Mirrored)  - 1 x daily - 7 x weekly - 1 sets - 10 reps - Sidelying Shoulder Abduction Palm Forward  - 1 x daily - 7 x weekly - 1 sets - 10 reps - Sidelying Shoulder Horizontal Abduction  - 1 x daily - 7 x weekly - 1 sets - 10 reps - Sidelying Shoulder Flexion 15 Degrees (Mirrored)  - 1 x daily - 7 x weekly - 1 sets - 10 reps - Standing Shoulder Shrug and Retraction with Resistance  - 1 x daily - 7 x weekly - 1 sets - 10 reps - Standing Shoulder Row with Anchored Resistance  - 1 x daily - 7 x weekly - 1 sets - 10 reps  ASSESSMENT:  CLINICAL IMPRESSION: Pt returns to PT. Began instructing pt in proper muscle  activation during flexion, abduction and ER to help improve glenohumeral rhythm and decrease pain. Pt initially required t/c for proper form but then demonstrated improved awareness and was able to self correct. Issued new HEP with new exercises given today.    Pt will benefit from skilled therapeutic intervention to improve on the following deficits: Decreased knowledge of precautions and postural dysfunction. Other deficits: decreased ROM, decreased strength, increased fascial restrictions, impaired UE functional use, postural dysfunction, and pain  PT treatment/interventions: ADL/self-care home management, pt/family education, therapeutic exercise. Other interventions [id number]- PT Re-evaluation, 97110-Therapeutic exercises, 97530- Therapeutic activity, [id number]- Neuromuscular re-education, 97535- Self Care, [id number]- Manual therapy, 97760- Orthotic Initial, [phone]- Orthotic/Prosthetic subsequent, and Patient/Family education  REHAB POTENTIAL: Good  CLINICAL DECISION MAKING: Evolving/moderate complexity  EVALUATION COMPLEXITY: Moderate   GOALS: Goals reviewed with patient? YES  LONG TERM GOALS: (STG=LTG)   Name Target Date  Goal status  1 Patient will be able to verbalize understanding of a home exercise program for cervical range of motion, posture, and walking.   Baseline:  No knowledge 01/28/2024 Achieved at eval  2 Patient will be able to verbalize understanding of proper sitting and standing posture. Baseline:  No knowledge 01/28/2024 Achieved at eval  3 Patient will be able to verbalize understanding of lymphedema risk and availability of treatment for this condition Baseline:  No knowledge 01/28/2024 Achieved at eval  4 Pt will demonstrate a return to full cervical ROM and function post operatively compared to baselines and not demonstrate any signs or symptoms of lymphedema.  Baseline: See objective measurements taken today. 03/30/24 New  5 Pt will demonstrate 150 degrees of L shoulder  flexion to allow him to reach overhead.  03/30/24 NEW  6 Pt will demonstrate 150 degrees of L shoulder abduction to allow him to reach out to the side. 03/30/24 NEW   7 Pt will report a 50% improvement in R shoulder pain to allow improved comfort and function.   03/30/24 NEW  8 Pt will be independent in a home exercise program for continued stretching and strengtheing.   03/30/24 NEW    PLAN:  PT FREQUENCY/DURATION: 1x/wk for 6 weeks starting 02/17/24  PLAN FOR NEXT SESSION: measure R shoulder ROM, cont instruction on proper gleno humeral rhythm, pulleys, ball, side lying exercises, scap mobs    Physical Therapy Information for During and After Head/Neck Cancer Treatment: Lymphedema is a swelling condition that you may be at risk for in your neck and/or face if you  have radiation treatment to the area and/or if you have surgery that includes removing lymph nodes.  There is treatment available for this condition and it is not life-threatening.  Contact your physician or physical therapist with concerns. An excellent resource for those seeking information on lymphedema is the National Lymphedema Network's website.  It can be accessed at www.lymphnet.org If you notice swelling in your neck or face at any time following surgery (even if it is many years from now), please contact your doctor or physical therapist to discuss this.  Lymphedema can be treated at any time but it is easier for you if it is treated early on. If you have had surgery to your neck, please check with your surgeon about how soon to start doing neck range of motion exercises.  If you are not having surgery, I encourage you to start doing neck range of motion exercises today and continue these while undergoing treatment, UNLESS you have irritation of your skin or soft tissue that is aggravated by doing them.  These exercises are intended to help you prevent loss of range of motion and/or to gain range of motion in your neck (which  can be limited by tightening effects of radiation), and NOT to aggravate these tissues if they develop sensitivities from treatment. Neck range of motion exercises should be done to the point of feeling a GENTLE, TOLERABLE stretch only.  You are encouraged to start a walking or other exercise program tomorrow and continue this as much as you are able through and after treatment.  Please feel free to call me with any questions. Florina Lanis Carbon, PT, CLT Physical Therapist and Certified Lymphedema Therapist [facility] [address], [address], [place], [place] [address] [phone] [email or web address]  WALKING  Walking is a great form of exercise to increase your strength, endurance and overall fitness.  A walking program can help you start slowly and gradually build endurance as you go.  Everyone's ability is different, so each person's starting point will be different.  You do not have to follow them exactly.  The are just samples. You should simply find out what's right for you and stick to that program.   In the beginning, you'll start off walking 2-3 times a day for short distances.  As you get stronger, you'll be walking further at just 1-2 times per day.  A. You Can Walk For A Certain Length Of Time Each Day    Walk 5 minutes 3 times per day.  Increase 2 minutes every 2 days (3 times per day).  Work up to 25-30 minutes (1-2 times per day).   Example:   Day 1-2 5 minutes 3 times per day   Day 7-8 12 minutes 2-3 times per day   Day 13-14 25 minutes 1-2 times per day  B. You Can Walk For a Certain Distance Each Day     Distance can be substituted for time.    Example:   3 trips to mailbox (at road)   3 trips to corner of block   3 trips around the block  C. Go to local high school and use the track.    Walk for distance ____ around track  Or time ____ minutes  D. Walk ____ Jog ____ Run ___   Why exercise?  So many benefits! Here  are SOME of them: Heart health, including raising your good cholesterol level and reducing heart rate and blood pressure Lung health, including improved lung capacity  It burns fats, and most of us  can stand to be leaner, whether or not we are overweight. It increases the body's natural painkillers and mood elevators, so makes you feel better. Not only makes you feel better, but look better too Improves sleep Takes a bite out of stress May decrease your risk of many types of cancer If you are currently undergoing cancer treatment, exercise may improve your ability to tolerate treatments including chemotherapy. For everybody, it can improve your energy level. Those with cancer-related fatigue report a 40-50% reduction in this symptom when exercising regularly. If you are a survivor of breast, colon, or prostate cancer, it may decrease your risk of a recurrence. (This may hold for other cancers too, but so far we have data just for these three types.)  How to exercise: Get your doctor's okay. Pick something you enjoy doing, like walking, Zumba, biking, swimming, or whatever. Start at low intensity and time, then gradually increase.  (See walking program handout.) Set a goal to achieve over time.  The American Cancer Society, American Heart Association, and U.S. Dept. of Health and Human Services recommend 150 minutes of moderate exercise, 75 minutes of vigorous exercise, or a combination of both per week. This should be done in episodes at least 10 minutes long, spread throughout the week.  Need help being motivated? Pick something you enjoy doing, because you'll be more inclined to stick with that activity than something that feels like a chore. Do it with a friend so that you are accountable to each other. Schedule it into your day. Place it on your calendar and keep that appointment just like you do any appointment that you make. Join an exercise group that meets at a specific time.  That way,  you have to show up on time, and that makes it harder to procrastinate about doing your workout.  It also keeps you accountable--people begin to expect you to be there. Join a gym where you feel comfortable and not intimidated, at the right cost. Sign up for something that you'll need to be in shape for on a specific date, like a 1K or a 5K to walk or run, a 20 or 30 mile bike ride, a mud run or something like that. If the date is looming, you know you'll need to train to be ready for it.  An added benefit is that many of these are fundraisers for good causes. If you've already paid for a gym membership, group exercise class or event, you might as well work out, so you haven't wasted your money!    [facility], PT 02/17/2024, 11:27 AM

## 2024-02-18 ENCOUNTER — Other Ambulatory Visit: Payer: Self-pay

## 2024-02-18 ENCOUNTER — Ambulatory Visit
Admission: RE | Admit: 2024-02-18 | Discharge: 2024-02-18 | Disposition: A | Discharge status: Home / Self Care | Source: Ambulatory Visit | Attending: Radiation Oncology | Admitting: Radiation Oncology

## 2024-02-18 DIAGNOSIS — Z510 Encounter for antineoplastic radiation therapy: Secondary | ICD-10-CM | POA: Diagnosis not present

## 2024-02-18 LAB — RAD ONC ARIA SESSION SUMMARY
Course Elapsed Days: 22
Plan Fractions Treated to Date: 15
Plan Prescribed Dose Per Fraction: 2 Gy
Plan Total Fractions Prescribed: 33
Plan Total Prescribed Dose: 66 Gy
Reference Point Dosage Given to Date: 30 Gy
Reference Point Session Dosage Given: 2 Gy
Session Number: 15

## 2024-02-19 ENCOUNTER — Inpatient Hospital Stay

## 2024-02-19 ENCOUNTER — Other Ambulatory Visit: Payer: Self-pay

## 2024-02-19 ENCOUNTER — Inpatient Hospital Stay: Admitting: Oncology

## 2024-02-19 ENCOUNTER — Ambulatory Visit
Admission: RE | Admit: 2024-02-19 | Discharge: 2024-02-19 | Disposition: A | Source: Ambulatory Visit | Attending: Radiation Oncology

## 2024-02-19 ENCOUNTER — Encounter: Payer: Self-pay | Admitting: Oncology

## 2024-02-19 ENCOUNTER — Inpatient Hospital Stay: Admitting: Nutrition

## 2024-02-19 VITALS — BP 130/79 | HR 82 | Temp 98.5°F | Resp 17 | Ht 68.0 in | Wt 155.0 lb

## 2024-02-19 DIAGNOSIS — C023 Malignant neoplasm of anterior two-thirds of tongue, part unspecified: Secondary | ICD-10-CM | POA: Diagnosis not present

## 2024-02-19 DIAGNOSIS — Z510 Encounter for antineoplastic radiation therapy: Secondary | ICD-10-CM | POA: Diagnosis not present

## 2024-02-19 LAB — CBC WITH DIFFERENTIAL (CANCER CENTER ONLY)
Abs Immature Granulocytes: 0.01 K/uL (ref 0.00–0.07)
Basophils Absolute: 0 K/uL (ref 0.0–0.1)
Basophils Relative: 1 %
Eosinophils Absolute: 0.1 K/uL (ref 0.0–0.5)
Eosinophils Relative: 3 %
HCT: 39.5 % (ref 39.0–52.0)
Hemoglobin: 13.9 g/dL (ref 13.0–17.0)
Immature Granulocytes: 0 %
Lymphocytes Relative: 21 %
Lymphs Abs: 0.8 K/uL (ref 0.7–4.0)
MCH: 31.2 pg (ref 26.0–34.0)
MCHC: 35.2 g/dL (ref 30.0–36.0)
MCV: 88.8 fL (ref 80.0–100.0)
Monocytes Absolute: 0.5 K/uL (ref 0.1–1.0)
Monocytes Relative: 13 %
Neutro Abs: 2.4 K/uL (ref 1.7–7.7)
Neutrophils Relative %: 62 %
Platelet Count: 207 K/uL (ref 150–400)
RBC: 4.45 MIL/uL (ref 4.22–5.81)
RDW: 12.1 % (ref 11.5–15.5)
WBC Count: 3.9 K/uL — ABNORMAL LOW (ref 4.0–10.5)
nRBC: 0 % (ref 0.0–0.2)

## 2024-02-19 LAB — RAD ONC ARIA SESSION SUMMARY
Course Elapsed Days: 23
Plan Fractions Treated to Date: 16
Plan Prescribed Dose Per Fraction: 2 Gy
Plan Total Fractions Prescribed: 33
Plan Total Prescribed Dose: 66 Gy
Reference Point Dosage Given to Date: 32 Gy
Reference Point Session Dosage Given: 2 Gy
Session Number: 16

## 2024-02-19 LAB — BASIC METABOLIC PANEL - CANCER CENTER ONLY
Anion gap: 8 (ref 5–15)
BUN: 12 mg/dL (ref 6–20)
CO2: 28 mmol/L (ref 22–32)
Calcium: 9.4 mg/dL (ref 8.9–10.3)
Chloride: 103 mmol/L (ref 98–111)
Creatinine: 0.79 mg/dL (ref 0.61–1.24)
GFR, Estimated: >60 mL/min (ref >60)
Glucose, Bld: 91 mg/dL (ref 70–99)
Potassium: 4.1 mmol/L (ref 3.5–5.1)
Sodium: 139 mmol/L (ref 135–145)

## 2024-02-19 LAB — MAGNESIUM: Magnesium: 2.1 mg/dL (ref 1.7–2.4)

## 2024-02-19 MED ORDER — PALONOSETRON HCL INJECTION 0.25 MG/5ML
0.2500 mg | Freq: Once | INTRAVENOUS | Status: AC
  Administered 2024-02-19: 0.25 mg via INTRAVENOUS
  Filled 2024-02-19: qty 5

## 2024-02-19 MED ORDER — SODIUM CHLORIDE 0.9 % IV SOLN
150.0000 mg | Freq: Once | INTRAVENOUS | Status: AC
  Administered 2024-02-19: 150 mg via INTRAVENOUS
  Filled 2024-02-19: qty 150

## 2024-02-19 MED ORDER — SODIUM CHLORIDE 0.9 % IV SOLN
40.0000 mg/m2 | Freq: Once | INTRAVENOUS | Status: AC
  Administered 2024-02-19: 74 mg via INTRAVENOUS
  Filled 2024-02-19: qty 74

## 2024-02-19 MED ORDER — MAGNESIUM SULFATE 2 GM/50ML IV SOLN
2.0000 g | Freq: Once | INTRAVENOUS | Status: AC
  Administered 2024-02-19: 2 g via INTRAVENOUS
  Filled 2024-02-19: qty 50

## 2024-02-19 MED ORDER — DEXAMETHASONE SOD PHOSPHATE PF 10 MG/ML IJ SOLN
10.0000 mg | Freq: Once | INTRAMUSCULAR | Status: AC
  Administered 2024-02-19: 10 mg via INTRAVENOUS

## 2024-02-19 MED ORDER — NUTREN 1.5 EN LIQD: ENTERAL | Status: DC

## 2024-02-19 MED ORDER — POTASSIUM CHLORIDE IN NACL 20-0.9 MEQ/L-% IV SOLN
Freq: Once | INTRAVENOUS | Status: AC
  Filled 2024-02-19: qty 1000

## 2024-02-19 MED ORDER — SODIUM CHLORIDE 0.9 % IV SOLN: INTRAVENOUS | Status: DC

## 2024-02-19 NOTE — Progress Notes (Signed)
 Nutrition follow-up with patient and wife during infusion.  Patient is receiving concurrent chemoradiation therapy for SCC of the right two thirds of tongue.  He is followed by Dr. Izell and Dr. Autumn.  G-tube: December 5 DME: [facility]  Weight:  155 pounds December 12 157 pounds December 4 157 pounds November 26 155 pounds November 18  Labs reviewed.  Estimated nutrition needs:  2365-2580 cal, 93-115 g protein, greater than 2 L fluid.  Patient reports his mouth is very sore and he is supplementing oral intake with tube feeding.  He is able to eat some soft foods and drink water and other oral nutrition drinks.  Reports 10 out of 10 pain after tube placement but this is gradually getting better.  He started administering bolus feeding very slowly taking 30 minutes to infuse one half carton.  He is fearful of vomiting and is doing what he can to listen to his body.  He is flushing his feeding tube every day and cleaning daily.  No drainage or redness noted.  He is using baking soda and salt water gargles.  He is starting to think about converting pain medications to liquids or crushable medications.  *Patient will require long-term enteral nutrition  Nutrition diagnosis:  Predicted sub-optimal energy intake evolving into inadequate oral intake related to tongue cancer as evidenced by patient's self-report of inability to eat adequate amounts.  Intervention: Patient educated on tube feeding administration. Discussed patient should continue to use feeding tube 3 times daily between meals.  Encouraged him to work up to 1 full carton 3 times a day over approximately 10 to 15 minutes.  Give 60 mL of free water before and after bolus feeding.  Will gradually increase formula to goal rate of 7 cartons daily split over 4 feedings.  He will flush with 60 mL water before and after bolus feedings and add additional 200 mL free water 4 times a day via tube. Reminded patient to use antiemetics as needed  and hold tube feeding for any nausea or vomiting. Written instructions provided.  Questions were answered.  Monitoring, evaluation, goals: Patient will tolerate adequate calories and protein to minimize weight loss.  Next visit: Friday, December 19 during infusion.  **Disclaimer: This note was dictated with voice recognition software. Similar sounding words can inadvertently be transcribed and this note may contain transcription errors which may not have been corrected upon publication of note.**

## 2024-02-19 NOTE — Progress Notes (Signed)
 [facility]  ONCOLOGY CLINIC PROGRESS NOTE   Patient Care Team: Wendee Lynwood HERO, NP as PCP - General (Nurse Practitioner) Otho Redell Curry, MD as Referring Physician (Otolaryngology) Malmfelt, Delon CROME, RN as Oncology Nurse Navigator Izell Domino, MD as Consulting Physician (Radiation Oncology) Autumn Millman, MD as Consulting Physician (Oncology)  PATIENT NAME: Benjamin Choi   MR#: [id number] DOB: May 31, 1990  Date of visit: 02/19/2024   ASSESSMENT & PLAN:   Benjamin Choi is a 33 y.o. 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 Focal positive for LVI and intratumoral PNI.  Margins positive.   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) Please review oncology history for additional details and timeline of events.  He underwent partial glossectomy with right cervical lymph node dissections on 12/03/23. 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 pT2, pN0, cM0.  His case was discussed in ENT multidisciplinary tumor conference on 01/13/2024.  Because of high risk features including positive margin status, focal positivity for LVI and intratumoral PNI, recommendation made to proceed with adjuvant concurrent chemoradiation.    Previously I discussed recent developments, findings, pathology reports and consensus opinion from ENT tumor conference with the patient and his wife who was accompanying.  They verbalized  understanding.  He is in agreement with the plan to proceed with concurrent chemoradiation, given positive margins.  We have discussed about role of cisplatin  being a radiosensitizer in the treatment of head and neck cancer.  We have discussed about the curative intent of chemoradiation for this patient.  Patient is willing to proceed with weekly cisplatin .  Started radiation treatments from 01/27/2024.  Started chemotherapy with cisplatin  from 01/29/2024.  Plan to continue this weekly during the course of radiation.  Due for cycle 4 of cisplatin  today.  Labs reveal no dose-limiting toxicities.  Mild leukopenia with white count of 3900 but ANC is at a safe level of 2400.  Will proceed with cisplatin  at 40 mg/m dose today.  He did undergo feeding tube placement on 02/12/2024.  Relying on tube feeds now mostly.  Previously referrals sent for nutritionist, speech-language pathology, physical therapy for supportive care.  RTC in 1 week for labs, office visit and continuation of cisplatin .  Oral mucositis due to antineoplastic chemoradiation therapy Significant oral mucositis with marked mouth soreness, expected to worsen over the next week as treatment continues. Using baking soda and salt rinses; declined Magic Mouthwash due to cost. Pain managed with liquid acetaminophen . Anticipated improvement after completion of chemoradiation. - Encouraged continued use of baking soda and salt mouth rinses. - Discussed Magic Mouthwash prescription; declined due to cost. - Provided anticipatory guidance regarding expected worsening of mucositis over the next week.  Leukopenia secondary to chemotherapy Mild leukopenia (WBC 3,900; ANC 2,400), within expected range for current chemotherapy cycle. Red cell and platelet counts stable. Dose adjustments and bone marrow stimulants will be considered if counts decline further. Counts expected to recover post-therapy. - Monitored complete blood count; neutrophil count  adequate for continuation of chemotherapy. - Discussed  possibility of dose adjustment or use of bone marrow stimulants if counts decline further. - Provided education regarding expected nadir and recovery of counts post-therapy.  Gastrostomy status Utilizing gastrostomy tube for nutrition due to oral mucositis and decreased oral intake. Experiencing discomfort with tube feeds. Nutritionist support ongoing. - Continued gastrostomy tube feeds for nutrition support. - Coordinated ongoing follow-up with nutritionist during treatment visits.  Drug-induced dermatitis Developed rash, likely drug-induced, which has improved without antibiotics. Unable to swallow pills due to throat pain; rash resolving. - Held antibiotics as rash is improving.  Epistaxis Recurrent mild epistaxis, likely multifactorial due to chemotherapy/radiation-induced mucosal dryness and cold weather. No evidence of severe bleeding or infection. - Recommended use of humidifier at night to reduce mucosal dryness. - Advised use of saline nasal spray for symptomatic relief.  I reviewed lab results and outside records for this visit and discussed relevant results with the patient. Diagnosis, plan of care and treatment options were also discussed in detail with the patient. Opportunity provided to ask questions and answers provided to his apparent satisfaction. Provided instructions to call our clinic with any problems, questions or concerns prior to return visit. I recommended to continue follow-up with PCP and sub-specialists. He verbalized understanding and agreed with the plan.   NCCN guidelines have been consulted in the planning of this patients care.  I spent a total of 42 minutes during this encounter with the patient including review of chart and various tests results, discussions about plan of care and coordination of care plan.   Chinita Patten, MD  02/19/2024 9:43 AM  [facility]  - [facility] [address] [place] [address] Dept: [phone] Dept Fax: [phone]    CHIEF COMPLAINT/ REASON FOR VISIT:   Invasive squamous cell carcinoma of the right lateral margin of tongue, status post partial glossectomy and right cervical lymph node dissection in September 2025.  Focal positive for LVI and intratumoral PNI.  Margins positive.   Current Treatment: Concurrent chemoradiation with weekly cisplatin  started from 01/27/2024.  INTERVAL HISTORY:    Discussed the use of AI scribe software for clinical note transcription with the patient, who gave verbal consent to proceed.  History of Present Illness  Benjamin Choi is a 33 year old male with malignant neoplasm of the anterior two-thirds of the tongue undergoing concurrent chemoradiation who presents for follow-up during active treatment.  He is utilizing his gastrostomy tube more frequently for nutrition due to significant oral mucositis and decreased oral intake, but remains able to consume small amounts by mouth and is maintaining hydration.  He reports severe oral mucositis and odynophagia, describing his mouth as extremely sore, which has impaired swallowing and oral medication administration. He is using baking soda and salt rinses for symptomatic relief and has not obtained Magic Mouthwash due to cost. He anticipates worsening mucositis as treatment continues. He experiences severe pain with coughing or vomiting, which has improved since gastrostomy tube placement one week ago. He is using liquid acetaminophen  for pain control and has not required opioid analgesics.  He has developed recurrent epistaxis, which he attributes to nasal dryness from cold weather and ongoing chemoradiation. He is not currently using a humidifier or saline spray. He also reports increased bowel movements, managed with polyethylene glycol, and feels he is nearing completion of this regimen.  A  drug-induced rash developed during treatment but has improved over the past week without antibiotics, as he  was unable to swallow pills due to throat pain. No new dermatologic symptoms are present. He denies sexual activity at this time.    I have reviewed the past medical history, past surgical history, social history and family history with the patient and they are unchanged from previous note.  HISTORY OF PRESENT ILLNESS:   ONCOLOGY HISTORY:   He initially presented to his PCP in December of 2024 with a painful sore on his right lateral tongue. He was given triamcinolone  paste at that time to manage this without relief achieved.  He returned to his PCP in May of 2025 with c/o persistent pain from the lesion and was advised to try magic mouthwash which again did not provide him any relief. He was subsequently advised to follow-up with his dentist who shaved down a sharp molar to help prevent further irritation against his tongue.   Given persistence of the painful tongue lesion, a biopsy was obtained on 10/27/23. Pathology showed findings consistent with keratinizing invasive squamous cell carcinoma with a minute focus of apparent LVI seen in a small vessel near the deep margin (and with tumor present in all margins of the submitted specimen).    Subsequently, the patient was referred to [facility] and was seen in consultation by Dr. Sallyann on 11/10/23 for further management. Oral examination performed at that time confirmed a 2 cm tender, ulcerated right lateral tongue mass. A laryngoscopy was also performed which showed no abnormal findings. Examination of the neck was also negative for cervical lymphadenopathy.    Imaging was advised for further evaluation and he accordingly presented for a soft tissue neck CT and chest CT at [facility] on 11/23/23 which collectively demonstrated: no definite evidence of invasive of local invasion within the soft tissues of the neck, with bilateral submental and  jugular chain lymph nodes measure up to 7 mm in thickness, within normal limits. CT findings were also negative for metastatic disease within the chest.    Based on Dr. Elvis recommendations, he opted to proceed with a partial glossectomy with right cervical lymph node dissections on 12/03/23. 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 pT2, pN0, cM0   He did return to the ED on 12/15/23 with c/o bleeding from his mouth. Labs collected in the ED were unremarkable and the bleeding had stopped by the time that he was evaluated in the ED. No intervention was required and he was advised to follow-up with ENT in an OP setting.    Swallowing issues, if any: none   Weight Changes: 5 lbs of weight loss (likely due to diet changes secondary to tongue pain)   Pain status: tongue is numb, was 10/10 pain prior to surgery. Recently bit his tongue and caused a wound in the Right lateral tongue.  Had dental evaluation and no intervention was needed.   Other symptoms: only able to eat soft foods due to initial pain from the lesion (eventually improved to eating chicken and steak); also reported that even drinking water would irritate his tongue    Tobacco history, if any: Former off/on smoker -- quit   ETOH abuse, if any: stopped drinking.   His case was discussed in ENT multidisciplinary tumor conference on 01/13/2024.  Because of high risk features including  positive margin status, focal positivity for LVI and intratumoral PNI, recommendation made to proceed with adjuvant concurrent chemoradiation.  Plan to proceed with weekly cisplatin  during the course of radiation.  Started concurrent chemoradiation with weekly cisplatin  from  01/27/2024.  Oncology History  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)  01/03/2024 Initial Diagnosis   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)   01/13/2024 Cancer Staging   Staging form: Oral Cavity, AJCC 8th Edition - Pathologic: Stage II (pT2, pN0, cM0) - Signed by Autumn Millman, MD on 01/13/2024 Stage prefix: Initial diagnosis Residual tumor (R): R1 Lymph-vascular invasion (LVI): LVI present/identified, NOS Perineural invasion (PNI): Present Subclassification of lymphovascular invasion: Intratumoral   01/29/2024 -  Chemotherapy   Patient is on Treatment Plan : HEAD/NECK Cisplatin  (40) q7d         REVIEW OF SYSTEMS:   Review of Systems - Oncology  All other pertinent systems were reviewed with the patient and are negative.  ALLERGIES: He has no known allergies.  MEDICATIONS:  Current Outpatient Medications  Medication Sig Dispense Refill   ciprofloxacin  (CIPRO ) 500 MG tablet Take 1 tablet (500 mg total) by mouth 2 (two) times daily. 20 tablet 0   dexamethasone  (DECADRON ) 4 MG tablet Take 2 tablets (8 mg) by mouth daily x 3 days starting the day after cisplatin  chemotherapy. Take with food. 30 tablet 1   fexofenadine  (ALLEGRA  ALLERGY) 180 MG tablet Take 1 tablet (180 mg total) by mouth daily. 90 tablet 1   fluticasone  (FLONASE ) 50 MCG/ACT nasal spray Place 2 sprays into both nostrils daily. 48 mL 1   gabapentin (NEURONTIN) 100 MG capsule Take 100 mg by mouth 3 (three) times daily.     hydrOXYzine  (ATARAX ) 10 MG tablet Take 1-2 tablets (10-20 mg total) by mouth 3 (three) times daily as needed for anxiety. 30 tablet 1   lidocaine  (XYLOCAINE ) 2 % solution Patient: Mix 1part 2% viscous lidocaine , 1part H20. Swish & spit 8 mL of diluted mixture up to 12 times a day PRN soreness. May swallow up to 4 times a day if throat is sore. 300 mL 3   lidocaine -prilocaine  (EMLA ) cream Apply to affected area once 30 g 3    Melatonin 10 MG TABS Take 10 mg by mouth at bedtime.     ondansetron  (ZOFRAN ) 8 MG tablet Take 1 tablet (8 mg total) by mouth every 8 (eight) hours as needed for nausea or vomiting. Start on the third day after cisplatin . 30 tablet 1   prochlorperazine  (COMPAZINE ) 10 MG tablet Take 1 tablet (10 mg total) by mouth every 6 (six) hours as needed (Nausea or vomiting). 30 tablet 1   oxyCODONE (ROXICODONE) 5 MG/5ML solution Take 5 mg by mouth every 6 (six) hours as needed. (Patient not taking: Reported on 02/19/2024)     No current facility-administered medications for this visit.     VITALS:   Blood pressure 130/79, pulse 82, temperature 98.5 F (36.9 C), temperature source Temporal, resp. rate 17, height 5' 8 (1.727 m), weight 155 lb (70.3 kg), SpO2 99%.  Wt Readings from Last 3 Encounters:  02/19/24 155 lb (70.3 kg)  02/12/24 157 lb (71.2 kg)  02/11/24 157 lb (71.2 kg)    Body mass index is 23.57 kg/m.    Onc Performance Status - 02/19/24 0849       ECOG Perf Status   ECOG Perf Status Ambulatory and capable of all selfcare but unable to carry out  any work activities.  Up and about more than 50% of waking hours      KPS SCALE   KPS % SCORE Cares for self, unable to carry on normal activity or to do active work            PHYSICAL EXAM:   Physical Exam Constitutional:      General: He is not in acute distress.    Appearance: Normal appearance.  HENT:     Head: Normocephalic and atraumatic.     Mouth/Throat:     Comments: Right lateral partial glossectomy site is healed well without any residual ulceration. There is swelling/nodularity in the floor of mouth that could be reactive.  Cannot exclude gross tumor in oral cavity. Eyes:     Conjunctiva/sclera: Conjunctivae normal.  Neck:     Comments:  Right-sided neck dissection scar has healed well. Cardiovascular:     Rate and Rhythm: Normal rate and regular rhythm.  Pulmonary:     Effort: Pulmonary effort is normal. No  respiratory distress.  Abdominal:     General: There is no distension.  Skin:    Findings: Rash (Scattered, maculopapular rash on the back, upper torso and back of neck on the right side) present.  Neurological:     General: No focal deficit present.     Mental Status: He is alert and oriented to person, place, and time.  Psychiatric:        Mood and Affect: Mood normal.        Behavior: Behavior normal.       LABORATORY DATA:   I have reviewed the data as listed.  Results for orders placed or performed in visit on 02/19/24  Magnesium   Result Value Ref Range   Magnesium  2.1 1.7 - 2.4 mg/dL  Basic Metabolic Panel - Cancer Center Only  Result Value Ref Range   Sodium 139 135 - 145 mmol/L   Potassium 4.1 3.5 - 5.1 mmol/L   Chloride 103 98 - 111 mmol/L   CO2 28 22 - 32 mmol/L   Glucose, Bld 91 70 - 99 mg/dL   BUN 12 6 - 20 mg/dL   Creatinine 9.20 9.38 - 1.24 mg/dL   Calcium 9.4 8.9 - 89.6 mg/dL   GFR, Estimated >39 >39 mL/min   Anion gap 8 5 - 15  CBC with Differential (Cancer Center Only)  Result Value Ref Range   WBC Count 3.9 (L) 4.0 - 10.5 K/uL   RBC 4.45 4.22 - 5.81 MIL/uL   Hemoglobin 13.9 13.0 - 17.0 g/dL   HCT 60.4 60.9 - 47.9 %   MCV 88.8 80.0 - 100.0 fL   MCH 31.2 26.0 - 34.0 pg   MCHC 35.2 30.0 - 36.0 g/dL   RDW 87.8 88.4 - 84.4 %   Platelet Count 207 150 - 400 K/uL   nRBC 0.0 0.0 - 0.2 %   Neutrophils Relative % 62 %   Neutro Abs 2.4 1.7 - 7.7 K/uL   Lymphocytes Relative 21 %   Lymphs Abs 0.8 0.7 - 4.0 K/uL   Monocytes Relative 13 %   Monocytes Absolute 0.5 0.1 - 1.0 K/uL   Eosinophils Relative 3 %   Eosinophils Absolute 0.1 0.0 - 0.5 K/uL   Basophils Relative 1 %   Basophils Absolute 0.0 0.0 - 0.1 K/uL   Immature Granulocytes 0 %   Abs Immature Granulocytes 0.01 0.00 - 0.07 K/uL       RADIOGRAPHIC STUDIES:  I have personally reviewed  the radiological images as listed and agree with the findings in the report.  IR GASTROSTOMY TUBE MOD  SED INDICATION: treatment for head and neck cancer trouble swallowing  EXAM: PERCUTANEOUS GASTROSTOMY TUBE PLACEMENT  COMPARISON:  CT AP, 02/02/2024  MEDICATIONS: Ancef  2 gm IV; Antibiotics were administered within 1 hour of the procedure.  0.5 mg glucagon  IV  CONTRAST:  15 mL of Isovue 300 administered into the gastric lumen.  ANESTHESIA/SEDATION: Moderate (conscious) sedation was employed during this procedure. A total of Versed  3 mg and Fentanyl  150 mcg was administered intravenously.  Moderate Sedation Time: 14 minutes. The patient's level of consciousness and vital signs were monitored continuously by radiology nursing throughout the procedure under my direct supervision.  FLUOROSCOPY: Radiation Exposure Index and estimated peak skin dose (PSD);  Reference air kerma (RAK), 2 mGy.  COMPLICATIONS: None immediate.  PROCEDURE: Informed written consent was obtained from the patient and/or patient's representative following explanation of the procedure, risks, benefits and alternatives. A time out was performed prior to the initiation of the procedure. Maximal barrier sterile technique utilized including caps, mask, sterile gowns, sterile gloves, large sterile drape, hand hygiene and sterile prep.  The LEFT costal margin and barium opacified transverse colon were identified and avoided. Air was injected into the stomach for insufflation and visualization under fluoroscopy. Under sterile conditions and local anesthesia, 2 T tacks were utilized to pexy the anterior aspect of the stomach against the ventral abdominal wall. Contrast injection confirmed appropriate positioning of each of the T tacks. An incision was made between the T tacks and a 17 gauge trocar needle was utilized to access the stomach. Needle position was confirmed within the stomach with aspiration of air and injection of a small amount of contrast. A stiff guidewire was advanced into the gastric  lumen and under intermittent fluoroscopic guidance, the access needle was exchanged for a telescoping peel-away sheath, ultimately allowing placement of a 14 Fr balloon retention gastrostomy tube. The retention balloon was insufflated with a mixture of dilute saline and contrast and pulled taut against the anterior wall of the stomach. The external disc was cinched. Contrast injection confirms positioning within the stomach. Several spot radiographic images were obtained in various obliquities for documentation. The patient tolerated procedure well without immediate post procedural complication.  FINDINGS: After successful fluoroscopic guided placement, the gastrostomy tube is appropriately positioned with internal retention balloon against the ventral aspect of the gastric lumen.  IMPRESSION: Successful fluoroscopic insertion of a 14 Fr balloon retention gastrostomy tube.  The gastrostomy may be used immediately for medication administration and in 4 hrs for the initiation of feeds.  RECOMMENDATIONS: The patient will return to [facility] ([facility]) for routine feeding tube evaluation and exchange in 6 months.  Thom Hall, MD  [facility]  [facility]  Electronically Signed   By: Thom Hall M.D.   On: 02/12/2024 17:27    CODE STATUS:  Code Status History     Date Active Date Inactive Code Status Order ID Comments User Context   01/26/2024 1510 01/27/2024 0527 Full Code [id number]  Philip Cornet, MD HOV    Questions for Most Recent Historical Code Status (Order [id number])     Question Answer   By: Consent: discussion documented in EHR            No orders of the defined types were placed in this encounter.    Future Appointments  Date Time Provider Department Center  02/19/2024 10:00 AM [facility]  INFUSION [facility] None  02/19/2024  1:45 PM Daryle Railing L, RD [facility] None  02/22/2024   9:15 AM [facility] [id number] [facility] None  02/22/2024  9:30 AM [facility] None  02/23/2024  9:15 AM [facility] [id number] [facility] None  02/24/2024  9:15 AM [facility] [id number] [facility] None  02/24/2024 10:00 AM Breedlove Blue, Blaire L, PT [facility] None  02/25/2024  8:00 AM [facility] FLUSH [facility] None  02/25/2024  8:30 AM Goebel Hellums, MD [facility] None  02/25/2024  9:15 AM [facility] [id number] [facility] None  02/25/2024  9:30 AM Jacelyn Lupita NOVAK, CCC-SLP [facility]  02/26/2024  9:15 AM [facility] [id number] [facility] None  02/26/2024 10:00 AM [facility] INFUSION [facility] None  02/26/2024 11:15 AM Neff, Barbara L, RD [facility] None  02/29/2024  9:15 AM [facility] [id number] [facility] None  03/01/2024  9:15 AM [facility] [id number] [facility] None  03/02/2024  8:30 AM [facility] FLUSH [facility] None  03/02/2024  9:00 AM Carley Strickling, MD [facility] None  03/02/2024  9:15 AM [facility] [id number] [facility] None  03/04/2024  7:30 AM [facility] INFUSION [facility] None  03/04/2024  9:45 AM Ivonne Harlene RAMAN, RD [facility] None  03/07/2024  9:15 AM [facility] [id number] [facility] None  03/08/2024  9:15 AM [facility] [id number] [facility] None  03/08/2024 11:00 AM Breedlove Blue, Blaire L, PT [facility] None  03/09/2024  9:15 AM [facility] [id number] [facility] None  03/11/2024  8:45 AM [facility] FLUSH [facility] None  03/11/2024  9:15 AM [facility] [id number] [facility] None  03/11/2024 10:00 AM [facility] INFUSION [facility] None  03/11/2024 11:15 AM Ivonne Harlene RAMAN, RD [facility] None  03/14/2024  9:15 AM [facility] [id number] [facility] None  03/15/2024  9:15 AM [facility] [id number] [facility] None  03/15/2024 10:00 AM Breedlove Blue, Blaire L, PT [facility] None  03/16/2024  9:15 AM [facility] [id number] [facility] None  03/17/2024  9:15 AM [facility] [id number] [facility] None  03/18/2024  9:15 AM [facility] [id number]  [facility] None  08/12/2024  3:30 PM [facility] 1 [facility] [place]      This document was completed utilizing speech recognition software. Grammatical errors, random word insertions, pronoun errors, and incomplete sentences are an occasional consequence of this system due to software limitations, ambient noise, and hardware issues. Any formal questions or concerns about the content, text or information contained within the body of this dictation should be directly addressed to the provider for clarification.

## 2024-02-19 NOTE — Patient Instructions (Signed)
 [facility] - A DEPT OF [facility]  Discharge Instructions: Thank you for choosing [facility] to provide your oncology and hematology care.   If you have a lab appointment with the Cancer Center, please go directly to the Cancer Center and check in at the registration area.   Wear comfortable clothing and clothing appropriate for easy access to any Portacath or PICC line.   We strive to give you quality time with your provider. You may need to reschedule your appointment if you arrive late (15 or more minutes).  Arriving late affects you and other patients whose appointments are after yours.  Also, if you miss three or more appointments without notifying the office, you may be dismissed from the clinic at the provider's discretion.      For prescription refill requests, have your pharmacy contact our office and allow 72 hours for refills to be completed.    Today you received the following chemotherapy and/or immunotherapy agents: Cisplatin  (Platinol )    To help prevent nausea and vomiting after your treatment, we encourage you to take your nausea medication as directed.  BELOW ARE SYMPTOMS THAT SHOULD BE REPORTED IMMEDIATELY: *FEVER GREATER THAN 100.4 F (38 C) OR HIGHER *CHILLS OR SWEATING *NAUSEA AND VOMITING THAT IS NOT CONTROLLED WITH YOUR NAUSEA MEDICATION *UNUSUAL SHORTNESS OF BREATH *UNUSUAL BRUISING OR BLEEDING *URINARY PROBLEMS (pain or burning when urinating, or frequent urination) *BOWEL PROBLEMS (unusual diarrhea, constipation, pain near the anus) TENDERNESS IN MOUTH AND THROAT WITH OR WITHOUT PRESENCE OF ULCERS (sore throat, sores in mouth, or a toothache) UNUSUAL RASH, SWELLING OR PAIN  UNUSUAL VAGINAL DISCHARGE OR ITCHING   Items with * indicate a potential emergency and should be followed up as soon as possible or go to the Emergency Department if any problems should occur.  Please show the CHEMOTHERAPY ALERT CARD or  IMMUNOTHERAPY ALERT CARD at check-in to the Emergency Department and triage nurse.  Should you have questions after your visit or need to cancel or reschedule your appointment, please contact [facility] - A DEPT OF [facility]  Dept: [phone]  and follow the prompts.  Office hours are 8:00 a.m. to 4:30 p.m. Monday - Friday. Please note that voicemails left after 4:00 p.m. may not be returned until the following business day.  We are closed weekends and major holidays. You have access to a nurse at all times for urgent questions. Please call the main number to the clinic Dept: [phone] and follow the prompts.   For any non-urgent questions, you may also contact your provider using MyChart. We now offer e-Visits for anyone 42 and older to request care online for non-urgent symptoms. For details visit [email or web address].   Also download the MyChart app! Go to the app store, search MyChart, open the app, select , and log in with your MyChart username and password.

## 2024-02-19 NOTE — Assessment & Plan Note (Addendum)
 Please review oncology history for additional details and timeline of events.  He underwent partial glossectomy with right cervical lymph node dissections on 12/03/23. 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 pT2, pN0, cM0.  His case was discussed in ENT multidisciplinary tumor conference on 01/13/2024.  Because of high risk features including positive margin status, focal positivity for LVI and intratumoral PNI, recommendation made to proceed with adjuvant concurrent chemoradiation.    Previously I discussed recent developments, findings, pathology reports and consensus opinion from ENT tumor conference with the patient and his wife who was accompanying.  They verbalized understanding.  He is in agreement with the plan to proceed with concurrent chemoradiation, given positive margins.  We have discussed about role of cisplatin  being a radiosensitizer in the treatment of head and neck cancer.  We have discussed about the curative intent of chemoradiation for this patient.  Patient is willing to proceed with weekly cisplatin .  Started radiation treatments from 01/27/2024.  Started chemotherapy with cisplatin  from 01/29/2024.  Plan to continue this weekly during the course of radiation.  Due for cycle 4 of cisplatin  today.  Labs reveal no dose-limiting toxicities.  Mild leukopenia with white count of 3900 but ANC is at a safe level of 2400.  Will proceed with cisplatin  at 40 mg/m dose today.  He did undergo feeding tube placement on 02/12/2024.  Relying on tube feeds now mostly.  Previously referrals sent for nutritionist, speech-language pathology, physical therapy for supportive care.  RTC in 1 week for labs, office  visit and continuation of cisplatin .

## 2024-02-22 ENCOUNTER — Ambulatory Visit: Admission: RE | Admit: 2024-02-22 | Discharge: 2024-02-22 | Attending: Radiation Oncology

## 2024-02-22 ENCOUNTER — Other Ambulatory Visit: Payer: Self-pay

## 2024-02-22 ENCOUNTER — Ambulatory Visit
Admission: RE | Admit: 2024-02-22 | Discharge: 2024-02-22 | Disposition: A | Source: Ambulatory Visit | Attending: Radiation Oncology

## 2024-02-22 ENCOUNTER — Inpatient Hospital Stay

## 2024-02-22 DIAGNOSIS — Z510 Encounter for antineoplastic radiation therapy: Secondary | ICD-10-CM | POA: Diagnosis not present

## 2024-02-22 LAB — RAD ONC ARIA SESSION SUMMARY
Course Elapsed Days: 26
Plan Fractions Treated to Date: 17
Plan Prescribed Dose Per Fraction: 2 Gy
Plan Total Fractions Prescribed: 33
Plan Total Prescribed Dose: 66 Gy
Reference Point Dosage Given to Date: 34 Gy
Reference Point Session Dosage Given: 2 Gy
Session Number: 17

## 2024-02-22 NOTE — Progress Notes (Signed)
 [facility] CSW Progress Note  Clinical Social Worker contacted patient approx half way through treatment as planned. Patient described being in the rough: part of treatment and experiencing several side effects. Despite this, patient described having a sink or swim mindset and stated he would not sink in relation to this treatment. Patient denied any immediate needs from CSW. Referral sent for massage therapy. Patient has contact if needs change.   Lizbeth Sprague, LCSW Clinical Social Worker [facility]  [phone]

## 2024-02-22 NOTE — Progress Notes (Signed)
 Oncology Nurse Navigator Documentation    I met with Benjamin Choi before his weekly visit with Dr. Izell today. I removed two t -tacs without difficulty with suture removal scissors. He tolerated well. The skin under the t-tacs was slightly red but the skin was intact. He is changing the dressing daily to his PEG tube as directed. He knows to call me if I can help with any questions or concerns.  [name] RN, BSN, OCN Head & Neck Oncology Nurse Navigator [facility] Phone # [phone]  Fax # [phone]

## 2024-02-23 ENCOUNTER — Ambulatory Visit
Admission: RE | Admit: 2024-02-23 | Discharge: 2024-02-23 | Disposition: A | Source: Ambulatory Visit | Attending: Radiation Oncology

## 2024-02-23 ENCOUNTER — Other Ambulatory Visit: Payer: Self-pay

## 2024-02-23 DIAGNOSIS — Z510 Encounter for antineoplastic radiation therapy: Secondary | ICD-10-CM | POA: Diagnosis not present

## 2024-02-23 LAB — RAD ONC ARIA SESSION SUMMARY
Course Elapsed Days: 27
Plan Fractions Treated to Date: 18
Plan Prescribed Dose Per Fraction: 2 Gy
Plan Total Fractions Prescribed: 33
Plan Total Prescribed Dose: 66 Gy
Reference Point Dosage Given to Date: 36 Gy
Reference Point Session Dosage Given: 2 Gy
Session Number: 18

## 2024-02-24 ENCOUNTER — Ambulatory Visit: Admission: RE | Admit: 2024-02-24 | Discharge: 2024-02-24 | Attending: Radiation Oncology

## 2024-02-24 ENCOUNTER — Other Ambulatory Visit: Payer: Self-pay

## 2024-02-24 ENCOUNTER — Encounter: Payer: Self-pay | Admitting: Physical Therapy

## 2024-02-24 ENCOUNTER — Ambulatory Visit: Admitting: Physical Therapy

## 2024-02-24 DIAGNOSIS — M25511 Pain in right shoulder: Secondary | ICD-10-CM

## 2024-02-24 DIAGNOSIS — R293 Abnormal posture: Secondary | ICD-10-CM

## 2024-02-24 DIAGNOSIS — M25611 Stiffness of right shoulder, not elsewhere classified: Secondary | ICD-10-CM | POA: Diagnosis not present

## 2024-02-24 DIAGNOSIS — Z510 Encounter for antineoplastic radiation therapy: Secondary | ICD-10-CM | POA: Diagnosis not present

## 2024-02-24 DIAGNOSIS — C023 Malignant neoplasm of anterior two-thirds of tongue, part unspecified: Secondary | ICD-10-CM | POA: Diagnosis not present

## 2024-02-24 LAB — RAD ONC ARIA SESSION SUMMARY
Course Elapsed Days: 28
Plan Fractions Treated to Date: 19
Plan Prescribed Dose Per Fraction: 2 Gy
Plan Total Fractions Prescribed: 33
Plan Total Prescribed Dose: 66 Gy
Reference Point Dosage Given to Date: 38 Gy
Reference Point Session Dosage Given: 2 Gy
Session Number: 19

## 2024-02-24 NOTE — Patient Instructions (Signed)
Over Head Pull: Narrow and Wide Grip   Cancer Rehab [phone]   On back, knees bent, feet flat, band across thighs, elbows straight but relaxed. Pull hands apart (start). Keeping elbows straight, bring arms up and over head, hands toward floor. Keep pull steady on band. Hold momentarily. Return slowly, keeping pull steady, back to start. Then do same with a wider grip on the band (past shoulder width) Repeat _10__ times. Band color __yellow____   Side Pull: Double Arm   On back, knees bent, feet flat. Arms perpendicular to body, shoulder level, elbows straight but relaxed. Pull arms out to sides, elbows straight. Resistance band comes across collarbones, hands toward floor. Hold momentarily. Slowly return to starting position. Repeat _10__ times. Band color _yellow____   Sword   On back, knees bent, feet flat, left hand on left hip, right hand above left. Pull right arm DIAGONALLY (hip to shoulder) across chest. Bring right arm along head toward floor. Hold momentarily. Thumb is pointed down when by opposite hip and rotates upwards when by head.  Slowly return to starting position. Repeat _10__ times. Do with left arm. Band color _yellow_____   Shoulder Rotation: Double Arm   On back, knees bent, feet flat, elbows tucked at sides, bent 90, hands palms up. Pull hands apart and down toward floor, keeping elbows near sides. Hold momentarily. Slowly return to starting position. Repeat _10__ times. Band color __yellow____

## 2024-02-24 NOTE — Therapy (Signed)
 OUTPATIENT PHYSICAL THERAPY HEAD AND NECK BASELINE TREATMENT   Patient Name: Benjamin Choi MRN: [id number] DOB:1990/05/29, 33 y.o., male Today's Date: 02/24/2024  END OF SESSION:  PT End of Session - 02/24/24 1003     Visit Number 3    Number of Visits 7    Date for Recertification  03/30/24    PT Start Time 1002    PT Stop Time 1051    PT Time Calculation (min) 49 min    Activity Tolerance Patient tolerated treatment well    Behavior During Therapy WFL for tasks assessed/performed          Past Medical History:  Diagnosis Date   Bipolar disorder (HCC)    Depression    Past Surgical History:  Procedure Laterality Date   IR GASTROSTOMY TUBE MOD SED  02/12/2024   IR IMAGING GUIDED PORT INSERTION  01/26/2024   Patient Active Problem List   Diagnosis Date Noted   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) 01/03/2024   Anxiety 11/02/2023   Tongue cancer (HCC) 11/02/2023   Radicular pain in left arm 04/22/2023   Allergic rhinitis 04/22/2023   Tongue lesion 02/16/2023   Puncture wound 12/15/2022   Screening for lipid disorders 06/24/2022   Preventative health care 06/24/2022   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 The pain in not as bad in my R shoulder.   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 He presented to his PCP in Dec 2024 with a  painful sore on his right lateral tongue. He was given triamcinolone  paste without relief and returned May 2025 with persistent pain from the lesion and tried magic mouthwash again without relief. He saw a dentist who shaved down a sharp molar to prevent irritation to his tongue. Given persistent pain to the tongue lesion a biopsy was done 10/27/23 which showed findings consistent with keratinizing invasive SCC. He was referred to [facility]. 11/10/23 Consult with Dr. Sallyann. Exam at that time confirmed a 2 cm tender ulcerated right lateral tongue mass. Laryngoscopy negative and neck negative for cervical lymphadenopathy. 11/22/13 CT neck/chest at [facility] which collectively demonstrated: no definite evidence of invasive of local invasion within the soft tissues of the neck, with bilateral submental and jugular chain lymph nodes measure up to 7 mm in thickness, within normal limits. CT findings were also negative for metastatic disease within the chest.12/03/23 he opted to proceed with a partial glossectomy with right cervical lymph node dissections on 12/03/23. Pathology from the procedure showed: tumor the size of 1.3 cm; histology of moderately differentiated invasive squamous cell carcinoma of the tongue (keratinizing) invading a depth of 0.8 cm; positive for focal LVI and intratumoral PNI; Margin positive, nodal status of 29/29 excised lymph nodes negative for carcinoma. He will receive 33 fractions of radiation to his right tongue and right neck with weekly chemotherapy which started on 01/27/24 and will complete 03/17/24.  PATIENT GOALS:   to be educated about the signs and symptoms of lymphedema and learn post op HEP.   PAIN:  Are you having pain? Yes: NPRS scale: 7/10 Pain location: mouth Pain description: burning Aggravating factors: food Relieving factors: not eating, salt water rinses  PRECAUTIONS: Active CA  RED FLAGS: None   WEIGHT BEARING RESTRICTIONS: No  FALLS:  Has patient fallen in last 6  months? No Does the patient have a fear of falling that limits activity? No Is the patient reluctant to leave the house due to a fear of falling?No  LIVING ENVIRONMENT: Patient lives with: wife and kids aged 59 and 7 Lives in: House/apartment Has following equipment at home: None  OCCUPATION: on medical leave- retail banker   LEISURE: outside in yard daily, rides bikes (dirt bikes, mountain bikes), plays ball with kids  PRIOR LEVEL OF FUNCTION: Independent   OBJECTIVE: Note: Objective measures were completed at Evaluation unless otherwise noted.  COGNITION: Overall cognitive status: Within functional limits for tasks assessed                  POSTURE:  Forward head and rounded shoulders posture  30 SEC SIT TO STAND: 20 reps in 30 sec without use of UEs which is  Poor for patient's age. Pt reports he felt he could have completed more.   SHOULDER AROM:   Impaired  R shoulder limited post neck dissection surgery - will measure at next session   CERVICAL AROM:   Percent limited  Flexion [facility]  Extension WFL  Right lateral flexion 25% limited  Left lateral flexion 35% limited  Right rotation WFL  Left rotation WFL    (Blank rows=not tested)  UPPER EXTREMITY AROM/PROM:  A/PROM RIGHT   eval  RIGHT 02/24/24  Shoulder extension 68 62  Shoulder flexion 128 130  Shoulder abduction 75 65  Shoulder internal rotation Unable  Unable  Shoulder external rotation unable Unable    (Blank rows = not tested)  A/PROM LEFT   eval  Shoulder extension 70  Shoulder flexion 144  Shoulder abduction 157  Shoulder internal rotation 65  Shoulder external rotation 85    (Blank rows = not tested)  Neck Disability Index score: 19 / 50 = 38.0 %  TREATMENT PERFORMED: 02/24/24: Measured bilateral shoulder ROM Therapeutic Exercise: Ball up wall x 10 reps in direction of flexion with v/c to engage scapular muscles throughout to prevent hiking Pulleys x 2 min in direction of flexion  with v/c throughout for form and to keep from hiking and 1 min in direction of abduction but stopped early due to inability to control shoulder hiking despite various degrees of abduction and scaption Therapeutic Activity: Supine over full foam roller:  Supine scapular series with yellow band x 10 reps each with pt returning therapist demo: narrow and wide grip flexion, horizontal abduction, ER, and diagonals with v/c to keep scapular muscles engaged  Alternating flexion x 10 reps, bilateral scaption x 10 reps, horizontal abduction x 10 reps, snow angels x 10 reps  2 min stretch in horizontal abduction with therapist assisting by helping keep arm abducted so pt could feel stretch  Dual Cable Machine: Scapular retraction x 10 reps x 7 lbs with focus on engaging scapular muscles Shoulder extension with arms at 6, 3 lbs x 10 reps Shoulder ER on R x 10 reps with 3 lbs   02/17/24: Measured bialteral shoulder ROM  Therapeutic Exercise: Pulleys x 2 min in direction of flexion and  2 min in direction of abduction with v/c to keep shoulder relaxed  Therapeutic Activity: Ball up wall x 10 reps in direction of flexion with v/c and t/c to keep scapular muscle engaged throughout and then 5 reps in to R shoulder abduction - stopped due to pain in joint L sidelying: R shoulder abduction with therapist manual assist to keep shoulder from going forward with v/c to keep scapular muscles engaged x 10 with the last few pt able to complete without therapist assist with good control, then R shoulder flexion x 10 reps with initial manual assist but then pt able to complete without assist with good control, ER with towel under elbow x 5 reps with 2 lbs and then 5 with 1 lb (decreased due to discomfort at feeding tube) Seated: scapular retraction with red band x 10 with good activation of scapular muscles Shoulder shrugs with 2 lbs with initial t/c from therapist for shoulder positioning but then pt able to control  shoulder x 10 reps  PATIENT EDUCATION:  Education details: how to control scapular muscles to avoid compensation to decrease pain, new HEP Person educated: Patient Education method: Explanation, Demonstration, Handout Education comprehension: Patient verbalized understanding and returned demonstration  HOME EXERCISE PROGRAM: Neck ROM exercises   Access Code: [email or web address] URL: [email or web address] Date: 02/17/2024 Prepared by: Florina Lanis Carbon  Exercises - Sidelying Shoulder External Rotation (Mirrored)  - 1 x daily - 7 x weekly - 1 sets - 10 reps - Sidelying Shoulder Abduction Palm Forward  - 1 x daily - 7 x weekly - 1 sets - 10 reps - Sidelying Shoulder Horizontal Abduction  - 1 x daily - 7 x weekly - 1 sets - 10 reps - Sidelying Shoulder Flexion 15 Degrees (Mirrored)  - 1 x daily - 7 x weekly - 1 sets - 10 reps - Standing Shoulder Shrug and Retraction with Resistance  - 1 x daily - 7 x weekly - 1 sets - 10 reps - Standing Shoulder Row with Anchored Resistance  - 1 x daily - 7 x weekly - 1 sets - 10 reps  Supine Scapular Series  ASSESSMENT:  CLINICAL IMPRESSION: Pt has been doing his HEP at home. His shoulder ROM was re measured today and there was little improvement but pt reports less pain with overhead movement. Instructed pt in supine scapular series today over foam roller. The foam roller helped position his shoulder better throughout. He did well with the new exercises and felt challenged. He still has to focus on engaging scapular muscles throughout. Began dual cable machine for shoulder strengthening as well. Educated pt on need for strengthening posterior shoulder muscles to help overcome the pull from the pec.   Pt will benefit from skilled therapeutic intervention to improve on the following deficits: Decreased knowledge of precautions and postural dysfunction. Other deficits: decreased ROM, decreased strength, increased fascial restrictions, impaired UE  functional use, postural dysfunction, and pain  PT treatment/interventions: ADL/self-care home management, pt/family education, therapeutic exercise. Other interventions [id number]- PT Re-evaluation, 97110-Therapeutic exercises, 97530- Therapeutic activity, [id number]- Neuromuscular re-education, 97535- Self Care, [id number]- Manual therapy, 97760- Orthotic Initial, [phone]- Orthotic/Prosthetic subsequent, and Patient/Family education  REHAB POTENTIAL: Good  CLINICAL DECISION MAKING: Evolving/moderate complexity  EVALUATION COMPLEXITY: Moderate   GOALS: Goals reviewed with patient? YES  LONG TERM GOALS: (STG=LTG)   Name Target Date  Goal status  1 Patient will be able to verbalize understanding of a home exercise program for cervical range of motion, posture, and walking.   Baseline:  No knowledge 01/28/2024 Achieved at eval  2 Patient will be able to verbalize understanding of proper sitting and standing posture. Baseline:  No knowledge 01/28/2024 Achieved at eval  3 Patient will be able to verbalize understanding of lymphedema risk and availability of treatment for this condition Baseline:  No knowledge 01/28/2024 Achieved at eval  4 Pt will demonstrate a return to full cervical ROM and function post operatively compared to baselines and not demonstrate any signs or symptoms of lymphedema.  Baseline: See objective measurements taken today. 03/30/24 New  5 Pt will demonstrate 150 degrees of L shoulder flexion to allow him to reach overhead.  03/30/24 NEW  6 Pt will demonstrate 150 degrees of L shoulder abduction to allow him to reach out to the side. 03/30/24 NEW   7 Pt will report a 50% improvement in R shoulder pain to allow improved comfort and function.   03/30/24 NEW  8 Pt will be independent in a home exercise program for continued stretching and strengtheing.   03/30/24 NEW    PLAN:  PT FREQUENCY/DURATION: 1x/wk for 6 weeks starting 02/17/24  PLAN FOR NEXT SESSION: measure R shoulder ROM,  cont instruction on proper gleno humeral rhythm, pulleys, ball, side lying exercises, scap mobs    Physical Therapy Information for During and After Head/Neck Cancer Treatment: Lymphedema is a swelling condition that you may be at risk for in your neck and/or face if you have radiation treatment to the area and/or if you have surgery that includes removing lymph nodes.  There is treatment available for this condition and it is not life-threatening.  Contact your physician or physical therapist with concerns. An excellent resource for those seeking information on lymphedema is the National Lymphedema Network's website.  It can be accessed at www.lymphnet.org If you notice swelling in your neck or face at any time following surgery (even if it is many years from now), please contact your doctor or physical therapist to discuss this.  Lymphedema can be treated at any time but it is easier for you if it is treated early on. If you have had surgery to your neck, please check with your surgeon about how soon to start doing neck range of motion exercises.  If you are not having surgery, I encourage you to start doing neck range of motion exercises today and continue these while undergoing treatment, UNLESS you have irritation of your skin or soft tissue that is aggravated by doing them.  These exercises are intended to help you prevent loss of range of motion and/or to gain range of motion in your neck (which can be limited by tightening effects of radiation), and NOT to aggravate these tissues if they develop sensitivities from treatment. Neck range of motion exercises should be done to the point of feeling a GENTLE, TOLERABLE stretch only.  You are encouraged to start a walking or other exercise program tomorrow and continue this as much as you are able through and after treatment.  Please feel free to call me with any questions. Florina Lanis Carbon, PT, CLT Physical Therapist and Certified Lymphedema  Therapist [facility] [address], [address], [place], [place] [address] [phone] [email or web address]  WALKING  Walking is a great form of exercise to increase your strength, endurance and overall fitness.  A walking program can help you start slowly and gradually build endurance as you go.  Everyone's ability is different, so each person's starting point will be different.  You do not have to  follow them exactly.  The are just samples. You should simply find out what's right for you and stick to that program.   In the beginning, you'll start off walking 2-3 times a day for short distances.  As you get stronger, you'll be walking further at just 1-2 times per day.  A. You Can Walk For A Certain Length Of Time Each Day    Walk 5 minutes 3 times per day.  Increase 2 minutes every 2 days (3 times per day).  Work up to 25-30 minutes (1-2 times per day).   Example:   Day 1-2 5 minutes 3 times per day   Day 7-8 12 minutes 2-3 times per day   Day 13-14 25 minutes 1-2 times per day  B. You Can Walk For a Certain Distance Each Day     Distance can be substituted for time.    Example:   3 trips to mailbox (at road)   3 trips to corner of block   3 trips around the block  C. Go to local high school and use the track.    Walk for distance ____ around track  Or time ____ minutes  D. Walk ____ Jog ____ Run ___   Why exercise?  So many benefits! Here are SOME of them: Heart health, including raising your good cholesterol level and reducing heart rate and blood pressure Lung health, including improved lung capacity It burns fats, and most of us  can stand to be leaner, whether or not we are overweight. It increases the body's natural painkillers and mood elevators, so makes you feel better. Not only makes you feel better, but look better too Improves sleep Takes a bite out of stress May decrease your risk of many types of cancer If you are  currently undergoing cancer treatment, exercise may improve your ability to tolerate treatments including chemotherapy. For everybody, it can improve your energy level. Those with cancer-related fatigue report a 40-50% reduction in this symptom when exercising regularly. If you are a survivor of breast, colon, or prostate cancer, it may decrease your risk of a recurrence. (This may hold for other cancers too, but so far we have data just for these three types.)  How to exercise: Get your doctor's okay. Pick something you enjoy doing, like walking, Zumba, biking, swimming, or whatever. Start at low intensity and time, then gradually increase.  (See walking program handout.) Set a goal to achieve over time.  The American Cancer Society, American Heart Association, and U.S. Dept. of Health and Human Services recommend 150 minutes of moderate exercise, 75 minutes of vigorous exercise, or a combination of both per week. This should be done in episodes at least 10 minutes long, spread throughout the week.  Need help being motivated? Pick something you enjoy doing, because you'll be more inclined to stick with that activity than something that feels like a chore. Do it with a friend so that you are accountable to each other. Schedule it into your day. Place it on your calendar and keep that appointment just like you do any appointment that you make. Join an exercise group that meets at a specific time.  That way, you have to show up on time, and that makes it harder to procrastinate about doing your workout.  It also keeps you accountable--people begin to expect you to be there. Join a gym where you feel comfortable and not intimidated, at the right cost. Sign up for something that you'll need to  be in shape for on a specific date, like a 1K or a 5K to walk or run, a 20 or 30 mile bike ride, a mud run or something like that. If the date is looming, you know you'll need to train to be ready for it.  An added  benefit is that many of these are fundraisers for good causes. If you've already paid for a gym membership, group exercise class or event, you might as well work out, so you haven't wasted your money!    [employer], PT 02/24/2024, 11:02 AM

## 2024-02-25 ENCOUNTER — Inpatient Hospital Stay: Admitting: Oncology

## 2024-02-25 ENCOUNTER — Other Ambulatory Visit: Payer: Self-pay

## 2024-02-25 ENCOUNTER — Ambulatory Visit: Admission: RE | Admit: 2024-02-25 | Discharge: 2024-02-25 | Attending: Radiation Oncology

## 2024-02-25 ENCOUNTER — Inpatient Hospital Stay

## 2024-02-25 ENCOUNTER — Ambulatory Visit: Attending: Radiation Oncology

## 2024-02-25 ENCOUNTER — Encounter: Payer: Self-pay | Admitting: Oncology

## 2024-02-25 VITALS — BP 114/83 | HR 100 | Temp 97.6°F | Resp 18 | Ht 69.0 in | Wt 150.5 lb

## 2024-02-25 DIAGNOSIS — C023 Malignant neoplasm of anterior two-thirds of tongue, part unspecified: Secondary | ICD-10-CM | POA: Diagnosis not present

## 2024-02-25 DIAGNOSIS — G893 Neoplasm related pain (acute) (chronic): Secondary | ICD-10-CM | POA: Diagnosis not present

## 2024-02-25 DIAGNOSIS — Z510 Encounter for antineoplastic radiation therapy: Secondary | ICD-10-CM | POA: Diagnosis not present

## 2024-02-25 LAB — RAD ONC ARIA SESSION SUMMARY
Course Elapsed Days: 29
Plan Fractions Treated to Date: 20
Plan Prescribed Dose Per Fraction: 2 Gy
Plan Total Fractions Prescribed: 33
Plan Total Prescribed Dose: 66 Gy
Reference Point Dosage Given to Date: 40 Gy
Reference Point Session Dosage Given: 2 Gy
Session Number: 20

## 2024-02-25 LAB — BASIC METABOLIC PANEL - CANCER CENTER ONLY
Anion gap: 11 (ref 5–15)
BUN: 13 mg/dL (ref 6–20)
CO2: 27 mmol/L (ref 22–32)
Calcium: 9.5 mg/dL (ref 8.9–10.3)
Chloride: 100 mmol/L (ref 98–111)
Creatinine: 0.8 mg/dL (ref 0.61–1.24)
GFR, Estimated: >60 mL/min (ref >60)
Glucose, Bld: 98 mg/dL (ref 70–99)
Potassium: 4.2 mmol/L (ref 3.5–5.1)
Sodium: 137 mmol/L (ref 135–145)

## 2024-02-25 LAB — CBC WITH DIFFERENTIAL (CANCER CENTER ONLY)
Abs Immature Granulocytes: 0.02 K/uL (ref 0.00–0.07)
Basophils Absolute: 0 K/uL (ref 0.0–0.1)
Basophils Relative: 1 %
Eosinophils Absolute: 0.1 K/uL (ref 0.0–0.5)
Eosinophils Relative: 3 %
HCT: 40.3 % (ref 39.0–52.0)
Hemoglobin: 14.6 g/dL (ref 13.0–17.0)
Immature Granulocytes: 1 %
Lymphocytes Relative: 17 %
Lymphs Abs: 0.7 K/uL (ref 0.7–4.0)
MCH: 31.6 pg (ref 26.0–34.0)
MCHC: 36.2 g/dL — ABNORMAL HIGH (ref 30.0–36.0)
MCV: 87.2 fL (ref 80.0–100.0)
Monocytes Absolute: 0.5 K/uL (ref 0.1–1.0)
Monocytes Relative: 11 %
Neutro Abs: 2.9 K/uL (ref 1.7–7.7)
Neutrophils Relative %: 67 %
Platelet Count: 194 K/uL (ref 150–400)
RBC: 4.62 MIL/uL (ref 4.22–5.81)
RDW: 12.3 % (ref 11.5–15.5)
WBC Count: 4.3 K/uL (ref 4.0–10.5)
nRBC: 0 % (ref 0.0–0.2)

## 2024-02-25 LAB — MAGNESIUM: Magnesium: 2.1 mg/dL (ref 1.7–2.4)

## 2024-02-25 MED ORDER — TRAMADOL HCL 50 MG PO TABS: 50.0000 mg | ORAL_TABLET | Freq: Four times a day (QID) | ORAL | 0 refills | Status: DC | PRN

## 2024-02-25 MED FILL — Fosaprepitant Dimeglumine For IV Infusion 150 MG (Base Eq): INTRAVENOUS | Qty: 5 | Status: AC

## 2024-02-25 NOTE — Therapy (Incomplete)
 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  Rationale for Evaluation and Treatment: Rehabilitation  SUBJECTIVE:   SUBJECTIVE STATEMENT: Pt denies current diet changes due to surgical changes to lingual musculature - initially he ate pureed and soft foods.  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 He presented to his PCP in Dec 2024 with a painful sore on his right lateral tongue. He was given triamcinolone  paste without relief and returned May 2025 with persistent pain from the lesion and tried magic mouthwash again without relief. He saw a dentist who shaved down a sharp molar to prevent irritation to his tongue. Given persistent pain to the tongue lesion a biopsy was done 10/27/23 which showed findings consistent with  keratinizing invasive SCC. He was referred to [facility]. 11/10/23 Consult with Dr. Sallyann. Exam at that time confirmed a 2 cm tender ulcerated right lateral tongue mass. Laryngoscopy negative and neck negative for cervical lymphadenopathy. 11/22/13 CT neck/chest at [facility] which collectively demonstrated: no definite evidence of invasive of local invasion within the soft tissues of the neck, with bilateral submental and jugular chain lymph nodes measure up to 7 mm in thickness, within normal limits. CT findings were also negative for metastatic disease within the chest. 12/03/23 he opted to proceed with a partial glossectomy with right cervical lymph node dissections on 12/03/23. Pathology from the procedure showed: tumor the size of 1.3 cm; histology of moderately differentiated invasive squamous cell carcinoma of the tongue (keratinizing) invading a depth of 0.8 cm; positive for focal LVI and intratumoral PNI; Margin positive, nodal status of 29/29 excised lymph nodes negative for carcinoma. 01/01/24 Consult with Dr. Izell, 01/07/24 Dental clearance, 01/13/24 Consult with Dr. Autumn. He will receive chemotherapy with radiation. Treatment plan: He will receive 33 fractions of radiation to his right tongue and right neck with weekly chemotherapy which started on 01/27/24 and will complete 03/17/24.Pretreatment procedures: 12/03/23 Partial Glossectomy with right cervical lymph node dissection at [facility]. 01/26/24 PAC, he has declined PEG at this time and will be scheduled 4-5 weeks into treatment.    PAIN:  Are you having pain? Yes: NPRS scale: 5/10 Pain location: R shoulder Pain description: electric shock type pain Aggravating factors: certain postures Relieving factors: gabapentin  FALLS: Has patient fallen in last 6 months?  No  LIVING ENVIRONMENT: Lives with: lives with their family Lives in: House/apartment  PLOF:  Level of assistance: Independent with ADLs, Independent with IADLs Employment: Full-time  employment  PATIENT GOALS: Maintain WNL swallowing  OBJECTIVE:  Note: Objective measures were completed at Evaluation unless otherwise noted. DIAGNOSTIC FINDINGS: seepertinent history  INSTRUMENTAL SWALLOW STUDY FINDINGS (MBSS) none in chart  COGNITION: Overall cognitive status: Within functional limits for tasks assessed  LANGUAGE: Receptive and Expressive language appeared WNL.  ORAL MOTOR EXAMINATION: Overall status: Impaired: Lingual: Right (ROM, Symmetry, Sensation, and Coordination) Comments: Appreciate the surgical changes - pt's ROM was decr'd to lt due to changes surgically.   MOTOR SPEECH: Overall motor speech: Appears intact when pt speaks slower, he reports when he talks faster it will distort /r/ and /s/.  Respiration: thoracic breathing and diaphragmatic/abdominal breathing Phonation: normal Resonance: WFL Articulation: Appears intact Intelligibility: Intelligible Effective technique: slow rate  SUBJECTIVE DYSPHAGIA REPORTS:  Date of onset: after surgery Reported symptoms: cannot clear labial sulcus on rt, currently  Current diet: regular and thin liquids  Co-morbid voice changes: No  FACTORS WHICH MAY INCREASE RISK OF ADVERSE EVENT IN PRESENCE OF ASPIRATION:  General health: well appearing  Risk factors: tube present (not currently) ; pt undergoing ChRT for active cancer  CLINICAL SWALLOW ASSESSMENT:   Dentition: adequate natural dentition Vocal quality at baseline: normal Patient directly observed with POs: Yes: dysphagia 3 (soft) and thin liquids  Feeding: able to feed self Liquids provided by: cup Oral phase signs and symptoms: prolonged mastication and prolonged bolus formation Pharyngeal phase signs and symptoms: none noted                                                                                                                             TREATMENT DATE:   01/28/24: Research states the risk for dysphagia increases due to radiation  and/or chemotherapy treatment due to a variety of factors, so SLP educated the pt about the possibility of reduced/limited ability for PO intake during rad tx. SLP also educated pt regarding possible changes to swallowing musculature after rad tx, and why adherence to dysphagia HEP provided today and PO consumption was necessary to inhibit muscle fibrosis following rad tx and to mitigate muscle disuse atrophy. SLP informed pt why this would be detrimental to their swallowing status and to their pulmonary health. Pt demonstrated understanding of these things to SLP. SLP encouraged pt to safely eat and drink as deep into their radiation/chemotherapy as possible to provide the best possible long-term swallowing outcome for pt.  SLP then developed an individualized HEP for pt involving oral and pharyngeal strengthening and ROM and pt was instructed how to perform these exercises, including SLP demonstration. After SLP demonstration, pt return demonstrated each exercise. SLP ensured pt performance was correct prior to educating pt on next exercise. Pt required occasional min cues faded to modified independent to perform HEP. Pt was instructed to complete this program 6-7 days/week, at least 2 times  a day until 6 months after his or her last day of rad tx, and then x2 a week after that, indefinitely. Among other modifications for days when pt cannot functionally swallow, SLP also suggested pt to perform only non-swallowing tasks on the handout/HEP, and if necessary to cycle through the swallowing portion so the full program of exercises can be completed instead of fatiguing on one of the swallowing exercises and being unable to perform the other swallowing exercises. SLP instructed that swallowing exercises should then be added back into the regimen as pt is able to do so. Secondly, pt was told that former patients have told SLP that during their course of radiation therapy, taking prescribed pain medication just prior  to performing HEP (and eating/drinking) has proven helpful in completing HEP (and eating and drinking) more regularly when going through their course of radiation treatment.    PATIENT EDUCATION: Education details: late effects head/neck radiation on swallow function, HEP procedure, and modification to HEP when difficulty experienced with swallowing during and after radiation course Person educated: Patient and Spouse Education method: Explanation, Demonstration, Verbal cues, and Handouts Education comprehension: verbalized understanding, returned demonstration, verbal cues required, and needs further education   ASSESSMENT:  CLINICAL IMPRESSION: Patient is a 33 y.o. M who was seen today for assessment of swallowing as they undergo radiation/chemoradiation therapy. Today pt ate turkey sandwich and drank thin liquids without overt s/s pharyngeal difficulty. Pt took extra time masticating and manipulating bolus, however this was functional for pt. At this time pt swallowing is deemed [facility] with these POs. No oral or overt s/sx pharyngeal deficits, including aspiration were observed. There are no overt s/s aspiration PNA observed by SLP nor any reported by pt at this time. Data indicate that pt's swallow ability will likely decrease over the course of radiation/chemoradiation therapy and could very well decline over time following the conclusion of that therapy due to muscle disuse atrophy and/or muscle fibrosis. Pt will need to be seen by SLP in order to assess safety of PO intake, assess the need for recommending any objective swallow assessment, and ensuring pt is correctly completing the individualized HEP.  OBJECTIVE IMPAIRMENTS: include dysphagia. These impairments are limiting patient from safety when swallowing. Factors affecting potential to achieve goals and functional outcome are none noted today. Patient will benefit from skilled SLP services to address above impairments and improve overall  function.   REHAB POTENTIAL: Good     GOALS: Goals reviewed with patient? No   SHORT TERM GOALS: Target: 3rd total session   1. Pt will complete HEP with modified independence in 2 sessions Baseline: Goal status: INITIAL   2.  pt will tell SLP why pt is completing HEP with modified independence Baseline:  Goal status: INITIAL   3.  pt will describe 3 overt s/s aspiration PNA with modified independence Baseline:  Goal status: INITIAL   4.  pt will demo knowledge of how a food journal could hasten return to a more normalized diet Baseline:  Goal status: INITIAL     LONG TERM GOALS: Target: 7th total session   1.  pt will complete HEP with independence over two visits Baseline:  Goal status: INITIAL   2.  pt will describe how to modify HEP over time, and the timeline associated with reduction in HEP frequency with modified independence over two sessions Baseline:  Goal status: INITIAL     PLAN:   SLP FREQUENCY:  once approx every 4 weeks   SLP DURATION:  7 sessions   PLANNED INTERVENTIONS: Aspiration precaution training, Pharyngeal strengthening exercises, Diet toleration management , Trials of upgraded texture/liquids, SLP instruction and feedback, Compensatory strategies, and Patient/family education, [phone] (treatment of swallowing dysfunction and/or oral function for feeding)   Letita Prentiss, CCC-SLP 02/25/2024, 9:21 AM

## 2024-02-25 NOTE — Progress Notes (Signed)
 [facility]  ONCOLOGY CLINIC PROGRESS NOTE   Patient Care Team: Wendee Lynwood HERO, NP as PCP - General (Nurse Practitioner) Otho Redell Curry, MD as Referring Physician (Otolaryngology) Malmfelt, Delon CROME, RN as Oncology Nurse Navigator Izell Domino, MD as Consulting Physician (Radiation Oncology) Autumn Millman, MD as Consulting Physician (Oncology)  PATIENT NAME: Benjamin Choi   MR#: [id number] DOB: Feb 19, 1991  Date of visit: 02/25/2024   ASSESSMENT & PLAN:   Benjamin Choi is a 33 y.o. 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 Focal positive for LVI and intratumoral PNI.  Margins positive.   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) Please review oncology history for additional details and timeline of events.  He underwent partial glossectomy with right cervical lymph node dissections on 12/03/23. 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 pT2, pN0, cM0.  His case was discussed in ENT multidisciplinary tumor conference on 01/13/2024.  Because of high risk features including positive margin status, focal positivity for LVI and intratumoral PNI, recommendation made to proceed with adjuvant concurrent chemoradiation.    Previously I discussed recent developments, findings, pathology reports and consensus opinion from ENT tumor conference with the patient and his wife who was accompanying.  They verbalized  understanding.  He is in agreement with the plan to proceed with concurrent chemoradiation, given positive margins.  We have discussed about role of cisplatin  being a radiosensitizer in the treatment of head and neck cancer.  We have discussed about the curative intent of chemoradiation for this patient.  Patient is willing to proceed with weekly cisplatin .  Started radiation treatments from 01/27/2024.  Started chemotherapy with cisplatin  from 01/29/2024.  Plan to continue this weekly during the course of radiation.  Due for cycle 5 of cisplatin  tomorrow.  Labs reveal no dose-limiting toxicities. Will proceed with cisplatin  at 40 mg/m dose tomorrow.  He did undergo feeding tube placement on 02/12/2024.  Relying on tube feeds now mostly.  Previously referrals sent for nutritionist, speech-language pathology, physical therapy for supportive care.  RTC in 1 week for labs, office visit and continuation of cisplatin .  Neoplasm related pain Experiencing severe oral and surgical site pain, described as burning and severe, limiting oral intake. Oxycodone liquid was discontinued due to intolerable nausea. Topical lidocaine  and mouthwashes provide partial relief, but pain remains inadequately controlled. Open to alternative pain management options with less gastrointestinal side effects. - Prescribed tramadol  50 mg tablets, to be taken every 4-6 hours as needed for pain, sent to his pharmacy. - Advised to continue topical lidocaine  and mouthwashes for local pain control. - Discussed option of magic mouthwash, but noted cost and lack of insurance coverage. - Advised to notify if pain is not controlled or if he requires a refill.  Oral mucositis due to antineoplastic therapy Developed oral mucositis secondary to chemoradiation, with associated burning pain and visible mucosal changes. Managing with frequent mouthwashes and topical agents. - Advised to continue frequent mouthwashes with baking soda and  salt to promote healing and clear  necrotic tissue. - Advised to continue topical lidocaine  for symptomatic relief. - Reinforced importance of maintaining oral hygiene and hydration.   I reviewed lab results and outside records for this visit and discussed relevant results with the patient. Diagnosis, plan of care and treatment options were also discussed in detail with the patient. Opportunity provided to ask questions and answers provided to his apparent satisfaction. Provided instructions to call our clinic with any problems, questions or concerns prior to return visit. I recommended to continue follow-up with PCP and sub-specialists. He verbalized understanding and agreed with the plan.   NCCN guidelines have been consulted in the planning of this patients care.  I spent a total of 42 minutes during this encounter with the patient including review of chart and various tests results, discussions about plan of care and coordination of care plan.   Chinita Patten, MD  02/25/2024 12:37 PM  [facility] - A DEPT OF [facility] [address] [place] [address] Dept: [phone] Dept Fax: [phone]    CHIEF COMPLAINT/ REASON FOR VISIT:   Invasive squamous cell carcinoma of the right lateral margin of tongue, status post partial glossectomy and right cervical lymph node dissection in September 2025.  Focal positive for LVI and intratumoral PNI.  Margins positive.   Current Treatment: Concurrent chemoradiation with weekly cisplatin  started from 01/27/2024.  INTERVAL HISTORY:    Discussed the use of AI scribe software for clinical note transcription with the patient, who gave verbal consent to proceed.  History of Present Illness  Benjamin Choi is a 33 year old male with malignant neoplasm of the anterior two-thirds of the tongue, status post surgical resection, currently undergoing concurrent chemoradiation, who  presents for routine oncology follow-up during active treatment.  He experiences severe oral pain and burning localized to the area of prior surgical resection, describing the sensation as chewing on razor blades. The pain is exacerbated by oral intake and limits his ability to eat, necessitating intermittent reliance on tube feeds. He previously used oxycodone 5 mg liquid for pain control but discontinued due to intolerable nausea and gastrointestinal side effects. He currently uses topical lidocaine  and frequent mouthwashes, including baking soda and salt, with inadequate pain relief. He has used compounded mouthwash in the past, but cost and lack of insurance coverage limit its use. He is amenable to trying tramadol  and prefers analgesic options with minimal gastrointestinal side effects.  He closely monitors laboratory results, noting fluctuations in his white blood cell count between 3,900 and 6,000, with a current count of 4,300. He inquired about tumor markers for surveillance and understands that post-treatment monitoring will be by imaging and clinical assessment. He denies new masses, rash, fever, chills, or other systemic symptoms. He reports fatigue but remains able to manage daily activities and is motivated to complete therapy. His wife is involved in his care.     I have reviewed the past medical history, past surgical history, social history and family history with the patient and they are unchanged from previous note.  HISTORY OF PRESENT ILLNESS:   ONCOLOGY HISTORY:   He initially presented to his PCP in December of 2024 with a painful sore on his right lateral tongue. He was given triamcinolone  paste at that time to manage this without relief achieved.  He returned to his PCP in May of 2025 with c/o persistent pain from the lesion and was advised to try magic mouthwash which again did  not provide him any relief. He was subsequently advised to follow-up with his dentist who shaved  down a sharp molar to help prevent further irritation against his tongue.   Given persistence of the painful tongue lesion, a biopsy was obtained on 10/27/23. Pathology showed findings consistent with keratinizing invasive squamous cell carcinoma with a minute focus of apparent LVI seen in a small vessel near the deep margin (and with tumor present in all margins of the submitted specimen).    Subsequently, the patient was referred to [facility]NT and was seen in consultation by Dr. Sallyann on 11/10/23 for further management. Oral examination performed at that time confirmed a 2 cm tender, ulcerated right lateral tongue mass. A laryngoscopy was also performed which showed no abnormal findings. Examination of the neck was also negative for cervical lymphadenopathy.    Imaging was advised for further evaluation and he accordingly presented for a soft tissue neck CT and chest CT at [facility] on 11/23/23 which collectively demonstrated: no definite evidence of invasive of local invasion within the soft tissues of the neck, with bilateral submental and jugular chain lymph nodes measure up to 7 mm in thickness, within normal limits. CT findings were also negative for metastatic disease within the chest.    Based on Dr. Elvis recommendations, he opted to proceed with a partial glossectomy with right cervical lymph node dissections on 12/03/23. 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 pT2, pN0, cM0   He did return to the ED on 12/15/23 with c/o bleeding from his mouth. Labs collected in the ED were unremarkable and the bleeding had stopped by the time that he was evaluated in the ED. No intervention was required and he was advised to  follow-up with ENT in an OP setting.    Swallowing issues, if any: none   Weight Changes: 5 lbs of weight loss (likely due to diet changes secondary to tongue pain)   Pain status: tongue is numb, was 10/10 pain prior to surgery. Recently bit his tongue and caused a wound in the Right lateral tongue.  Had dental evaluation and no intervention was needed.   Other symptoms: only able to eat soft foods due to initial pain from the lesion (eventually improved to eating chicken and steak); also reported that even drinking water would irritate his tongue    Tobacco history, if any: Former off/on smoker -- quit   ETOH abuse, if any: stopped drinking.   His case was discussed in ENT multidisciplinary tumor conference on 01/13/2024.  Because of high risk features including positive margin status, focal positivity for LVI and intratumoral PNI, recommendation made to proceed with adjuvant concurrent chemoradiation.  Plan to proceed with weekly cisplatin  during the course of radiation.  Started concurrent chemoradiation with weekly cisplatin  from 01/27/2024.  Oncology History  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)  01/03/2024 Initial Diagnosis   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)   01/13/2024 Cancer Staging   Staging form: Oral Cavity, AJCC 8th Edition - Pathologic: Stage II (pT2, pN0, cM0) - Signed by Autumn Millman, MD on 01/13/2024 Stage prefix: Initial diagnosis Residual tumor (R): R1 Lymph-vascular invasion (LVI): LVI present/identified, NOS Perineural invasion (PNI): Present Subclassification of  lymphovascular invasion: Intratumoral   01/29/2024 -  Chemotherapy   Patient is on Treatment Plan : HEAD/NECK Cisplatin  (40) q7d         REVIEW OF SYSTEMS:   Review of Systems - Oncology  All other pertinent systems were reviewed with the patient and are negative.  ALLERGIES: He has no known  allergies.  MEDICATIONS:  Current Outpatient Medications  Medication Sig Dispense Refill   dexamethasone  (DECADRON ) 4 MG tablet Take 2 tablets (8 mg) by mouth daily x 3 days starting the day after cisplatin  chemotherapy. Take with food. 30 tablet 1   fluticasone  (FLONASE ) 50 MCG/ACT nasal spray Place 2 sprays into both nostrils daily. 48 mL 1   hydrOXYzine  (ATARAX ) 10 MG tablet Take 1-2 tablets (10-20 mg total) by mouth 3 (three) times daily as needed for anxiety. 30 tablet 1   lidocaine  (XYLOCAINE ) 2 % solution Patient: Mix 1part 2% viscous lidocaine , 1part H20. Swish & spit 8 mL of diluted mixture up to 12 times a day PRN soreness. May swallow up to 4 times a day if throat is sore. 300 mL 3   lidocaine -prilocaine  (EMLA ) cream Apply to affected area once 30 g 3   Nutritional Supplements (NUTREN 1.5) LIQD Give 7 cartons Nutren (1750 mL) 1.5 or equivalent split over 4 feedings via G-tube daily.  Flush with 60 mL free water before and after bolus feeding.  Give additional 200 mL water 4 times a day.  Provides 2625 cal, 119 g protein, 2617 mL water/100% estimated needs.     ondansetron  (ZOFRAN ) 8 MG tablet Take 1 tablet (8 mg total) by mouth every 8 (eight) hours as needed for nausea or vomiting. Start on the third day after cisplatin . 30 tablet 1   prochlorperazine  (COMPAZINE ) 10 MG tablet Take 1 tablet (10 mg total) by mouth every 6 (six) hours as needed (Nausea or vomiting). 30 tablet 1   traMADol  (ULTRAM ) 50 MG tablet Take 1 tablet (50 mg total) by mouth every 6 (six) hours as needed. 60 tablet 0   ciprofloxacin  (CIPRO ) 500 MG tablet Take 1 tablet (500 mg total) by mouth 2 (two) times daily. (Patient not taking: Reported on 02/25/2024) 20 tablet 0   fexofenadine  (ALLEGRA  ALLERGY) 180 MG tablet Take 1 tablet (180 mg total) by mouth daily. (Patient not taking: Reported on 02/25/2024) 90 tablet 1   gabapentin (NEURONTIN) 100 MG capsule Take 100 mg by mouth 3 (three) times daily. (Patient not taking:  Reported on 02/25/2024)     Melatonin 10 MG TABS Take 10 mg by mouth at bedtime. (Patient not taking: Reported on 02/25/2024)     oxyCODONE (ROXICODONE) 5 MG/5ML solution Take 5 mg by mouth every 6 (six) hours as needed. (Patient not taking: Reported on 02/25/2024)     No current facility-administered medications for this visit.     VITALS:   Blood pressure 114/83, pulse 100, temperature 97.6 F (36.4 C), temperature source Temporal, resp. rate 18, height 5' 9 (1.753 m), weight 150 lb 8 oz (68.3 kg), SpO2 98%.  Wt Readings from Last 3 Encounters:  02/25/24 150 lb 8 oz (68.3 kg)  02/19/24 155 lb (70.3 kg)  02/12/24 157 lb (71.2 kg)    Body mass index is 22.22 kg/m.    Onc Performance Status - 02/25/24 1018       ECOG Perf Status   ECOG Perf Status Ambulatory and capable of all selfcare but unable to carry out any work activities.  Up and about more than  50% of waking hours      KPS SCALE   KPS % SCORE Cares for self, unable to carry on normal activity or to do active work           PHYSICAL EXAM:   Physical Exam Constitutional:      General: He is not in acute distress.    Appearance: Normal appearance.  HENT:     Head: Normocephalic and atraumatic.     Mouth/Throat:     Comments: Radiation associated mucositis noted, mild. No evidence of thrush.  Eyes:     Conjunctiva/sclera: Conjunctivae normal.  Neck:     Comments:  Right-sided neck dissection scar has healed well. Cardiovascular:     Rate and Rhythm: Normal rate and regular rhythm.  Pulmonary:     Effort: Pulmonary effort is normal. No respiratory distress.  Abdominal:     General: There is no distension.  Skin:    Findings: Rash: resolved.  Neurological:     General: No focal deficit present.     Mental Status: He is alert and oriented to person, place, and time.  Psychiatric:        Mood and Affect: Mood normal.        Behavior: Behavior normal.       LABORATORY DATA:   I have reviewed the  data as listed.  Results for orders placed or performed in visit on 02/25/24  Rad Onc Aria Session Summary  Result Value Ref Range   Course ID [id number]    Course Start Date 01/18/2024    Session Number 20    Course First Treatment Date 01/27/2024 12:50 PM    Course Last Treatment Date 02/25/2024  9:26 AM    Course Elapsed Days 29    Reference Point ID [email or web address] dp    Reference Point Dosage Given to Date 40 Gy   Reference Point Session Dosage Given 2 Gy   Plan ID [email or web address]_Tongue    Plan Name HN_R_Tongue    Plan Fractions Treated to Date 20    Plan Total Fractions Prescribed 33    Plan Prescribed Dose Per Fraction 2 Gy   Plan Total Prescribed Dose 66.000000 Gy   Plan Primary Reference Point [email or web address] dp   Results for orders placed or performed in visit on 02/25/24  CBC with Differential (Cancer Center Only)  Result Value Ref Range   WBC Count 4.3 4.0 - 10.5 K/uL   RBC 4.62 4.22 - 5.81 MIL/uL   Hemoglobin 14.6 13.0 - 17.0 g/dL   HCT 59.6 60.9 - 47.9 %   MCV 87.2 80.0 - 100.0 fL   MCH 31.6 26.0 - 34.0 pg   MCHC 36.2 (H) 30.0 - 36.0 g/dL   RDW 87.6 88.4 - 84.4 %   Platelet Count 194 150 - 400 K/uL   nRBC 0.0 0.0 - 0.2 %   Neutrophils Relative % 67 %   Neutro Abs 2.9 1.7 - 7.7 K/uL   Lymphocytes Relative 17 %   Lymphs Abs 0.7 0.7 - 4.0 K/uL   Monocytes Relative 11 %   Monocytes Absolute 0.5 0.1 - 1.0 K/uL   Eosinophils Relative 3 %   Eosinophils Absolute 0.1 0.0 - 0.5 K/uL   Basophils Relative 1 %   Basophils Absolute 0.0 0.0 - 0.1 K/uL   Immature Granulocytes 1 %   Abs Immature Granulocytes 0.02 0.00 - 0.07 K/uL  Basic Metabolic Panel - Cancer Center Only  Result Value Ref Range   Sodium 137 135 -  145 mmol/L   Potassium 4.2 3.5 - 5.1 mmol/L   Chloride 100 98 - 111 mmol/L   CO2 27 22 - 32 mmol/L   Glucose, Bld 98 70 - 99 mg/dL   BUN 13 6 - 20 mg/dL   Creatinine 9.19 9.38 - 1.24 mg/dL   Calcium 9.5 8.9 - 89.6 mg/dL   GFR, Estimated >39 >39 mL/min   Anion gap 11 5 - 15   Magnesium   Result Value Ref Range   Magnesium  2.1 1.7 - 2.4 mg/dL       RADIOGRAPHIC STUDIES:  I have personally reviewed the radiological images as listed and agree with the findings in the report.  IR GASTROSTOMY TUBE MOD SED INDICATION: treatment for head and neck cancer trouble swallowing  EXAM: PERCUTANEOUS GASTROSTOMY TUBE PLACEMENT  COMPARISON:  CT AP, 02/02/2024  MEDICATIONS: Ancef  2 gm IV; Antibiotics were administered within 1 hour of the procedure.  0.5 mg glucagon  IV  CONTRAST:  15 mL of Isovue 300 administered into the gastric lumen.  ANESTHESIA/SEDATION: Moderate (conscious) sedation was employed during this procedure. A total of Versed  3 mg and Fentanyl  150 mcg was administered intravenously.  Moderate Sedation Time: 14 minutes. The patient's level of consciousness and vital signs were monitored continuously by radiology nursing throughout the procedure under my direct supervision.  FLUOROSCOPY: Radiation Exposure Index and estimated peak skin dose (PSD);  Reference air kerma (RAK), 2 mGy.  COMPLICATIONS: None immediate.  PROCEDURE: Informed written consent was obtained from the patient and/or patient's representative following explanation of the procedure, risks, benefits and alternatives. A time out was performed prior to the initiation of the procedure. Maximal barrier sterile technique utilized including caps, mask, sterile gowns, sterile gloves, large sterile drape, hand hygiene and sterile prep.  The LEFT costal margin and barium opacified transverse colon were identified and avoided. Air was injected into the stomach for insufflation and visualization under fluoroscopy. Under sterile conditions and local anesthesia, 2 T tacks were utilized to pexy the anterior aspect of the stomach against the ventral abdominal wall. Contrast injection confirmed appropriate positioning of each of the T tacks. An incision was made between the T tacks  and a 17 gauge trocar needle was utilized to access the stomach. Needle position was confirmed within the stomach with aspiration of air and injection of a small amount of contrast. A stiff guidewire was advanced into the gastric lumen and under intermittent fluoroscopic guidance, the access needle was exchanged for a telescoping peel-away sheath, ultimately allowing placement of a 14 Fr balloon retention gastrostomy tube. The retention balloon was insufflated with a mixture of dilute saline and contrast and pulled taut against the anterior wall of the stomach. The external disc was cinched. Contrast injection confirms positioning within the stomach. Several spot radiographic images were obtained in various obliquities for documentation. The patient tolerated procedure well without immediate post procedural complication.  FINDINGS: After successful fluoroscopic guided placement, the gastrostomy tube is appropriately positioned with internal retention balloon against the ventral aspect of the gastric lumen.  IMPRESSION: Successful fluoroscopic insertion of a 14 Fr balloon retention gastrostomy tube.  The gastrostomy may be used immediately for medication administration and in 4 hrs for the initiation of feeds.  RECOMMENDATIONS: The patient will return to [facility]iology ([facility]) for routine feeding tube evaluation and exchange in 6 months.  Thom Hall, MD  [facility]  [facility]  Electronically Signed   By: Thom Hall M.D.   On: 02/12/2024 17:27  CODE STATUS:  Code Status History     Date Active Date Inactive Code Status Order ID Comments User Context   01/26/2024 1510 01/27/2024 0527 Full Code [id number]  Philip Cornet, MD HOV    Questions for Most Recent Historical Code Status (Order [id number])     Question Answer   By: Consent: discussion documented in EHR            No orders of the defined  types were placed in this encounter.    Future Appointments  Date Time Provider Department Center  02/26/2024  9:15 AM [facility] [id number] [facility] None  02/26/2024 10:00 AM [facility] INFUSION [facility] None  02/26/2024 11:15 AM Daryle Heron CROME, RD [facility] None  02/29/2024  9:15 AM [facility] [id number] [facility] None  02/29/2024  9:30 AM [facility] None  03/01/2024  9:15 AM [facility] [id number] [facility] None  03/02/2024  8:30 AM [facility] FLUSH [facility] None  03/02/2024  9:00 AM Samyukta Cura, MD [facility] None  03/02/2024  9:15 AM [facility] [id number] [facility] None  03/04/2024  7:30 AM [facility] INFUSION [facility] None  03/04/2024  9:45 AM Ivonne Harlene RAMAN, RD [facility] None  03/07/2024  9:15 AM [facility] [id number] [facility] None  03/08/2024  9:15 AM [facility] [id number] [facility] None  03/08/2024 11:00 AM Breedlove Blue, Blaire L, PT [facility] None  03/09/2024  9:15 AM [facility] [id number] [facility] None  03/11/2024  8:45 AM [facility] FLUSH [facility] None  03/11/2024  9:15 AM [facility] [id number] [facility] None  03/11/2024 10:00 AM [facility] INFUSION [facility] None  03/11/2024 11:15 AM Ivonne Harlene RAMAN, RD [facility] None  03/14/2024  9:15 AM [facility] [id number] [facility] None  03/15/2024  9:15 AM [facility] [id number] [facility] None  03/15/2024 10:00 AM Breedlove Blue, Blaire L, PT [facility] None  03/16/2024  9:15 AM [facility] [id number] [facility] None  03/17/2024  9:15 AM [facility] [id number] [facility] None  03/18/2024  9:15 AM [facility] [id number] [facility] None  08/12/2024  3:30 PM [facility] 1 [facility] [place]      This document was completed utilizing speech recognition software. Grammatical errors, random word insertions, pronoun errors, and incomplete sentences are an occasional consequence of this system due to software limitations, ambient noise, and hardware issues. Any formal questions or concerns  about the content, text or information contained within the body of this dictation should be directly addressed to the provider for clarification.

## 2024-02-25 NOTE — Assessment & Plan Note (Signed)
 Please review oncology history for additional details and timeline of events.  He underwent partial glossectomy with right cervical lymph node dissections on 12/03/23. 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 pT2, pN0, cM0.  His case was discussed in ENT multidisciplinary tumor conference on 01/13/2024.  Because of high risk features including positive margin status, focal positivity for LVI and intratumoral PNI, recommendation made to proceed with adjuvant concurrent chemoradiation.    Previously I discussed recent developments, findings, pathology reports and consensus opinion from ENT tumor conference with the patient and his wife who was accompanying.  They verbalized understanding.  He is in agreement with the plan to proceed with concurrent chemoradiation, given positive margins.  We have discussed about role of cisplatin  being a radiosensitizer in the treatment of head and neck cancer.  We have discussed about the curative intent of chemoradiation for this patient.  Patient is willing to proceed with weekly cisplatin .  Started radiation treatments from 01/27/2024.  Started chemotherapy with cisplatin  from 01/29/2024.  Plan to continue this weekly during the course of radiation.  Due for cycle 5 of cisplatin  tomorrow.  Labs reveal no dose-limiting toxicities. Will proceed with cisplatin  at 40 mg/m dose tomorrow.  He did undergo feeding tube placement on 02/12/2024.  Relying on tube feeds now mostly.  Previously referrals sent for nutritionist, speech-language pathology, physical therapy for supportive care.  RTC in 1 week for labs, office visit and continuation of cisplatin .

## 2024-02-25 NOTE — Progress Notes (Signed)
 Oncology Nurse Navigator Documentation   Mr. Benjamin Choi was scheduled after his radiation today to see Lupita Connor SLP for a 9:30 follow up appointment during head and neck clinic at the [facility]. I met him after his radiation appointment to walk him over to see Lupita but he let me know that he needed to get back upstairs for his lab and MD appointment that were scheduled for 8:30 earlier today. I provided him with my direct number to call when he was done but he did not call or return for the appointment. He will need to be scheduled with Lupita as an outpatient for continued follow up.   [name] RN, BSN, OCN Head & Neck Oncology Nurse Navigator [facility] Phone # [phone]  Fax # [phone]

## 2024-02-26 ENCOUNTER — Inpatient Hospital Stay

## 2024-02-26 ENCOUNTER — Other Ambulatory Visit: Payer: Self-pay

## 2024-02-26 ENCOUNTER — Inpatient Hospital Stay: Admitting: Nutrition

## 2024-02-26 ENCOUNTER — Inpatient Hospital Stay: Admitting: Oncology

## 2024-02-26 ENCOUNTER — Ambulatory Visit: Admission: RE | Admit: 2024-02-26 | Discharge: 2024-02-26 | Attending: Radiation Oncology

## 2024-02-26 VITALS — BP 125/82 | HR 91 | Temp 98.7°F | Resp 16 | Ht 69.0 in | Wt 150.5 lb

## 2024-02-26 DIAGNOSIS — C023 Malignant neoplasm of anterior two-thirds of tongue, part unspecified: Secondary | ICD-10-CM

## 2024-02-26 DIAGNOSIS — Z510 Encounter for antineoplastic radiation therapy: Secondary | ICD-10-CM | POA: Diagnosis not present

## 2024-02-26 LAB — RAD ONC ARIA SESSION SUMMARY
Course Elapsed Days: 30
Plan Fractions Treated to Date: 21
Plan Prescribed Dose Per Fraction: 2 Gy
Plan Total Fractions Prescribed: 33
Plan Total Prescribed Dose: 66 Gy
Reference Point Dosage Given to Date: 42 Gy
Reference Point Session Dosage Given: 2 Gy
Session Number: 21

## 2024-02-26 MED ORDER — PALONOSETRON HCL INJECTION 0.25 MG/5ML
0.2500 mg | Freq: Once | INTRAVENOUS | Status: AC
  Administered 2024-02-26: 0.25 mg via INTRAVENOUS
  Filled 2024-02-26: qty 5

## 2024-02-26 MED ORDER — POTASSIUM CHLORIDE IN NACL 20-0.9 MEQ/L-% IV SOLN
Freq: Once | INTRAVENOUS | Status: AC
  Filled 2024-02-26: qty 1000

## 2024-02-26 MED ORDER — SODIUM CHLORIDE 0.9 % IV SOLN: INTRAVENOUS | Status: DC

## 2024-02-26 MED ORDER — MAGNESIUM SULFATE 2 GM/50ML IV SOLN
2.0000 g | Freq: Once | INTRAVENOUS | Status: AC
  Administered 2024-02-26: 2 g via INTRAVENOUS
  Filled 2024-02-26: qty 50

## 2024-02-26 MED ORDER — DEXAMETHASONE SOD PHOSPHATE PF 10 MG/ML IJ SOLN
10.0000 mg | Freq: Once | INTRAMUSCULAR | Status: AC
  Administered 2024-02-26: 10 mg via INTRAVENOUS

## 2024-02-26 MED ORDER — SODIUM CHLORIDE 0.9 % IV SOLN
150.0000 mg | Freq: Once | INTRAVENOUS | Status: AC
  Administered 2024-02-26: 150 mg via INTRAVENOUS
  Filled 2024-02-26: qty 150

## 2024-02-26 MED ORDER — SODIUM CHLORIDE 0.9 % IV SOLN
40.0000 mg/m2 | Freq: Once | INTRAVENOUS | Status: AC
  Administered 2024-02-26: 74 mg via INTRAVENOUS
  Filled 2024-02-26: qty 74

## 2024-02-26 NOTE — Patient Instructions (Signed)
 [facility] - A DEPT OF [facility]  Discharge Instructions: Thank you for choosing [facility] to provide your oncology and hematology care.   If you have a lab appointment with the Cancer Center, please go directly to the Cancer Center and check in at the registration area.   Wear comfortable clothing and clothing appropriate for easy access to any Portacath or PICC line.   We strive to give you quality time with your provider. You may need to reschedule your appointment if you arrive late (15 or more minutes).  Arriving late affects you and other patients whose appointments are after yours.  Also, if you miss three or more appointments without notifying the office, you may be dismissed from the clinic at the provider's discretion.      For prescription refill requests, have your pharmacy contact our office and allow 72 hours for refills to be completed.    Today you received the following chemotherapy and/or immunotherapy agents: Cisplatin  (Platinol )    To help prevent nausea and vomiting after your treatment, we encourage you to take your nausea medication as directed.  BELOW ARE SYMPTOMS THAT SHOULD BE REPORTED IMMEDIATELY: *FEVER GREATER THAN 100.4 F (38 C) OR HIGHER *CHILLS OR SWEATING *NAUSEA AND VOMITING THAT IS NOT CONTROLLED WITH YOUR NAUSEA MEDICATION *UNUSUAL SHORTNESS OF BREATH *UNUSUAL BRUISING OR BLEEDING *URINARY PROBLEMS (pain or burning when urinating, or frequent urination) *BOWEL PROBLEMS (unusual diarrhea, constipation, pain near the anus) TENDERNESS IN MOUTH AND THROAT WITH OR WITHOUT PRESENCE OF ULCERS (sore throat, sores in mouth, or a toothache) UNUSUAL RASH, SWELLING OR PAIN  UNUSUAL VAGINAL DISCHARGE OR ITCHING   Items with * indicate a potential emergency and should be followed up as soon as possible or go to the Emergency Department if any problems should occur.  Please show the CHEMOTHERAPY ALERT CARD or  IMMUNOTHERAPY ALERT CARD at check-in to the Emergency Department and triage nurse.  Should you have questions after your visit or need to cancel or reschedule your appointment, please contact [facility] - A DEPT OF [facility]  Dept: [phone]  and follow the prompts.  Office hours are 8:00 a.m. to 4:30 p.m. Monday - Friday. Please note that voicemails left after 4:00 p.m. may not be returned until the following business day.  We are closed weekends and major holidays. You have access to a nurse at all times for urgent questions. Please call the main number to the clinic Dept: [phone] and follow the prompts.   For any non-urgent questions, you may also contact your provider using MyChart. We now offer e-Visits for anyone 42 and older to request care online for non-urgent symptoms. For details visit [email or web address].   Also download the MyChart app! Go to the app store, search MyChart, open the app, select , and log in with your MyChart username and password.

## 2024-02-26 NOTE — Progress Notes (Signed)
 Nutrition follow-up completed with patient during infusion SCC of the right two thirds of tongue.  Patient receives concurrent chemoradiation therapy.  He is followed by Dr. Izell and Dr. Autumn.  Final radiation therapy is scheduled for January 9.  G-tube: 14 French placed December 5 DME: Changed from [employer] to [employer] [facility] due to coverage  Weight:  150 pounds 8 ounces December 18 155 pounds December 12 157 pounds December 4 155 pounds November 18  Labs were reviewed.  Estimated nutrition needs: 2365-2580 cal, 93-115 g protein, greater than 2 L fluid.  Tube feeding goal: 7 cartons Nutren 1.5 (or Osmolite 1.5)daily spread out over 4 feedings a day with 60 mL free water flushes before and after bolus feeding.  Requires additional 200 mL free water 4 times a day.  Nutren 1.5 provides 2625 cal, 119 g protein, 2617 mL water  Patient reports his mouth continues to be very soft sore and his pain is not yet managed.  He is still eating some soft foods and trying to drink liquids.  States his tube site feels much better and he is able to move around now.  Reports tolerating 1 carton of Nutren 1.5 or equivalent 3 times a day with 60 mL free water before and after bolus feedings.  He is trying to work up to his fourth carton.  He is taking good care of his feeding tube site and denies drainage or redness.  He reports he has not received his tube feedings or supplies from [employer] [facility].  His wife is going to call again today.  I have also reached out to [employer] [facility] to inform that he has not received his order and encouraged them to contact patient.  Continues to use baking soda and salt water gargle.  Nutrition diagnosis: Inadequate oral intake, ongoing.  Intervention: Educated patient to continue increasing tube feedings by 1 carton daily to goal of 7 cartons split over 4 feedings.  Flush feeding tube with 60 mL free water before and after boluses.  Drink or flush an additional 200 mL of free water 4  times a day. Continue baking soda and salt water gargle. Continue soft foods as tolerated. Provided samples of Osmolite 1.5 (the equivalent of Nutren 1.5) and extra syringes. Provided [employer] phone number and encouraged patient to call for tube feeding and supplies.  Monitoring, evaluation, goals: Patient will tolerate tube feeding at goal rate to minimize further weight loss.  Next visit: Friday, December 26 during infusion.  **Disclaimer: This note was dictated with voice recognition software. Similar sounding words can inadvertently be transcribed and this note may contain transcription errors which may not have been corrected upon publication of note.**

## 2024-02-28 ENCOUNTER — Ambulatory Visit

## 2024-02-29 ENCOUNTER — Other Ambulatory Visit: Payer: Self-pay

## 2024-02-29 ENCOUNTER — Ambulatory Visit

## 2024-02-29 ENCOUNTER — Other Ambulatory Visit: Payer: Self-pay | Admitting: Radiation Oncology

## 2024-02-29 ENCOUNTER — Encounter: Payer: Self-pay | Admitting: Oncology

## 2024-02-29 ENCOUNTER — Ambulatory Visit: Admission: RE | Admit: 2024-02-29 | Discharge: 2024-02-29 | Attending: Radiation Oncology

## 2024-02-29 DIAGNOSIS — Z510 Encounter for antineoplastic radiation therapy: Secondary | ICD-10-CM | POA: Diagnosis not present

## 2024-02-29 DIAGNOSIS — C023 Malignant neoplasm of anterior two-thirds of tongue, part unspecified: Secondary | ICD-10-CM

## 2024-02-29 LAB — RAD ONC ARIA SESSION SUMMARY
Course Elapsed Days: 33
Plan Fractions Treated to Date: 22
Plan Prescribed Dose Per Fraction: 2 Gy
Plan Total Fractions Prescribed: 33
Plan Total Prescribed Dose: 66 Gy
Reference Point Dosage Given to Date: 44 Gy
Reference Point Session Dosage Given: 2 Gy
Session Number: 22

## 2024-02-29 MED ORDER — MAGIC MOUTHWASH W/LIDOCAINE: ORAL | 3 refills | Status: AC

## 2024-03-01 ENCOUNTER — Ambulatory Visit
Admission: RE | Admit: 2024-03-01 | Discharge: 2024-03-01 | Disposition: A | Discharge status: Home / Self Care | Source: Ambulatory Visit | Attending: Radiation Oncology | Admitting: Radiation Oncology

## 2024-03-01 ENCOUNTER — Other Ambulatory Visit: Payer: Self-pay | Admitting: Nurse Practitioner

## 2024-03-01 ENCOUNTER — Telehealth: Payer: Self-pay

## 2024-03-01 ENCOUNTER — Other Ambulatory Visit: Payer: Self-pay

## 2024-03-01 DIAGNOSIS — Z510 Encounter for antineoplastic radiation therapy: Secondary | ICD-10-CM | POA: Diagnosis not present

## 2024-03-01 LAB — RAD ONC ARIA SESSION SUMMARY
Course Elapsed Days: 34
Plan Fractions Treated to Date: 23
Plan Prescribed Dose Per Fraction: 2 Gy
Plan Total Fractions Prescribed: 33
Plan Total Prescribed Dose: 66 Gy
Reference Point Dosage Given to Date: 46 Gy
Reference Point Session Dosage Given: 2 Gy
Session Number: 23

## 2024-03-01 MED ORDER — HYDROXYZINE HCL 10 MG PO TABS: 10.0000 mg | ORAL_TABLET | Freq: Three times a day (TID) | ORAL | 1 refills | Status: AC | PRN

## 2024-03-01 NOTE — Telephone Encounter (Signed)
 Per MyChart, patient requested to have his cancer treatments for this week to be cancelled. Dr. Pasam responded in MyChart with the following: We can skip chemotherapy this week but I would advise to continue radiation treatments as scheduled.   I can still see you tomorrow, 03/02/2024 as scheduled, if you prefer.  Patient agreed to attend his Radiology Appointments but declines appointment with Dr. Autumn along with his Chemotherapy.   All concerning appointments have been cancelled.

## 2024-03-01 NOTE — Telephone Encounter (Signed)
 Copied from CRM [id number]. Topic: Clinical - Medication Refill >> Mar 01, 2024 10:08 AM Emylou G wrote: Medication: hydrOXYzine  (ATARAX ) 10 MG tablet  Has the patient contacted their pharmacy? Yes (Agent: If no, request that the patient contact the pharmacy for the refill. If patient does not wish to contact the pharmacy document the reason why and proceed with request.) (Agent: If yes, when and what did the pharmacy advise?) said no refills  This is the patient's preferred pharmacy:  [facility] [address], [place] - [address] [place] [address] Phone: [phone] Fax: [phone]   Is this the correct pharmacy for this prescription? Yes If no, delete pharmacy and type the correct one.   Has the prescription been filled recently? No  Is the patient out of the medication? Yes  Has the patient been seen for an appointment in the last year OR does the patient have an upcoming appointment? Yes  Can we respond through MyChart? Yes  Agent: Please be advised that Rx refills may take up to 3 business days. We ask that you follow-up with your pharmacy.

## 2024-03-02 ENCOUNTER — Ambulatory Visit
Admission: RE | Admit: 2024-03-02 | Discharge: 2024-03-02 | Disposition: A | Source: Ambulatory Visit | Attending: Radiation Oncology

## 2024-03-02 ENCOUNTER — Inpatient Hospital Stay: Admitting: Oncology

## 2024-03-02 ENCOUNTER — Other Ambulatory Visit: Payer: Self-pay

## 2024-03-02 ENCOUNTER — Inpatient Hospital Stay

## 2024-03-02 DIAGNOSIS — Z510 Encounter for antineoplastic radiation therapy: Secondary | ICD-10-CM | POA: Diagnosis not present

## 2024-03-02 LAB — RAD ONC ARIA SESSION SUMMARY
Course Elapsed Days: 35
Plan Fractions Treated to Date: 24
Plan Prescribed Dose Per Fraction: 2 Gy
Plan Total Fractions Prescribed: 33
Plan Total Prescribed Dose: 66 Gy
Reference Point Dosage Given to Date: 48 Gy
Reference Point Session Dosage Given: 2 Gy
Session Number: 24

## 2024-03-04 ENCOUNTER — Inpatient Hospital Stay

## 2024-03-04 ENCOUNTER — Telehealth: Payer: Self-pay

## 2024-03-04 ENCOUNTER — Inpatient Hospital Stay: Admitting: Dietician

## 2024-03-04 NOTE — Telephone Encounter (Signed)
 LVM for pt regarding Disability forms being completed, faxed, and confirmation received. Left call back number.

## 2024-03-07 ENCOUNTER — Ambulatory Visit

## 2024-03-07 ENCOUNTER — Other Ambulatory Visit: Payer: Self-pay

## 2024-03-07 ENCOUNTER — Ambulatory Visit
Admission: RE | Admit: 2024-03-07 | Discharge: 2024-03-07 | Disposition: A | Discharge status: Home / Self Care | Source: Ambulatory Visit | Attending: Radiation Oncology | Admitting: Radiation Oncology

## 2024-03-07 DIAGNOSIS — Z510 Encounter for antineoplastic radiation therapy: Secondary | ICD-10-CM | POA: Diagnosis not present

## 2024-03-07 LAB — RAD ONC ARIA SESSION SUMMARY
Course Elapsed Days: 40
Plan Fractions Treated to Date: 25
Plan Prescribed Dose Per Fraction: 2 Gy
Plan Total Fractions Prescribed: 33
Plan Total Prescribed Dose: 66 Gy
Reference Point Dosage Given to Date: 50 Gy
Reference Point Session Dosage Given: 2 Gy
Session Number: 25

## 2024-03-08 ENCOUNTER — Other Ambulatory Visit: Payer: Self-pay

## 2024-03-08 ENCOUNTER — Ambulatory Visit
Admission: RE | Admit: 2024-03-08 | Discharge: 2024-03-08 | Disposition: A | Discharge status: Home / Self Care | Source: Ambulatory Visit | Attending: Radiation Oncology | Admitting: Radiation Oncology

## 2024-03-08 ENCOUNTER — Ambulatory Visit: Admitting: Physical Therapy

## 2024-03-08 ENCOUNTER — Encounter: Payer: Self-pay | Admitting: Physical Therapy

## 2024-03-08 ENCOUNTER — Ambulatory Visit

## 2024-03-08 DIAGNOSIS — Z510 Encounter for antineoplastic radiation therapy: Secondary | ICD-10-CM | POA: Diagnosis not present

## 2024-03-08 DIAGNOSIS — M25511 Pain in right shoulder: Secondary | ICD-10-CM | POA: Diagnosis not present

## 2024-03-08 DIAGNOSIS — R293 Abnormal posture: Secondary | ICD-10-CM

## 2024-03-08 DIAGNOSIS — C023 Malignant neoplasm of anterior two-thirds of tongue, part unspecified: Secondary | ICD-10-CM | POA: Diagnosis not present

## 2024-03-08 DIAGNOSIS — M25611 Stiffness of right shoulder, not elsewhere classified: Secondary | ICD-10-CM | POA: Diagnosis not present

## 2024-03-08 LAB — RAD ONC ARIA SESSION SUMMARY
Course Elapsed Days: 41
Plan Fractions Treated to Date: 26
Plan Prescribed Dose Per Fraction: 2 Gy
Plan Total Fractions Prescribed: 33
Plan Total Prescribed Dose: 66 Gy
Reference Point Dosage Given to Date: 52 Gy
Reference Point Session Dosage Given: 2 Gy
Session Number: 26

## 2024-03-08 NOTE — Therapy (Signed)
 " OUTPATIENT PHYSICAL THERAPY HEAD AND NECK BASELINE TREATMENT   Patient Name: Benjamin Choi MRN: [id number] DOB:05-09-1990, 33 y.o., male Today's Date: 03/08/2024  END OF SESSION:  PT End of Session - 03/08/24 1106     Visit Number 4    Number of Visits 7    Date for Recertification  03/30/24    PT Start Time 1105    PT Stop Time 1200    PT Time Calculation (min) 55 min    Activity Tolerance Patient tolerated treatment well    Behavior During Therapy [facility] for tasks assessed/performed          Past Medical History:  Diagnosis Date   Bipolar disorder (HCC)    Depression    Past Surgical History:  Procedure Laterality Date   IR GASTROSTOMY TUBE MOD SED  02/12/2024   IR IMAGING GUIDED PORT INSERTION  01/26/2024   Patient Active Problem List   Diagnosis Date Noted   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) 01/03/2024   Anxiety 11/02/2023   Tongue cancer (HCC) 11/02/2023   Radicular pain in left arm 04/22/2023   Allergic rhinitis 04/22/2023   Tongue lesion 02/16/2023   Puncture wound 12/15/2022   Screening for lipid disorders 06/24/2022   Preventative health care 06/24/2022   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 He reports he can't talk due to mouth pain.   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 He presented  to his PCP in Dec 2024 with a painful sore on his right lateral tongue. He was given triamcinolone  paste without relief and returned May 2025 with persistent pain from the lesion and tried magic mouthwash again without relief. He saw a dentist who shaved down a sharp molar to prevent irritation to his tongue. Given persistent pain to the tongue lesion a biopsy was done 10/27/23 which showed findings consistent with keratinizing invasive SCC. He was referred to [facility]. 11/10/23 Consult with Dr. Sallyann. Exam at that time confirmed a 2 cm tender ulcerated right lateral tongue mass. Laryngoscopy negative and neck negative for cervical lymphadenopathy. 11/22/13 CT neck/chest at [facility] which collectively demonstrated: no definite evidence of invasive of local invasion within the soft tissues of the neck, with bilateral submental and jugular chain lymph nodes measure up to 7 mm in thickness, within normal limits. CT findings were also negative for metastatic disease within the chest.12/03/23 he opted to proceed with a partial glossectomy with right cervical lymph node dissections on 12/03/23. Pathology from the procedure showed: tumor the size of 1.3 cm; histology of moderately differentiated invasive squamous cell carcinoma of the tongue (keratinizing) invading a depth of 0.8 cm; positive for focal LVI and intratumoral PNI; Margin positive, nodal status of 29/29 excised lymph nodes negative for carcinoma. He will receive 33 fractions of radiation to his right tongue and right neck with weekly chemotherapy which started  on 01/27/24 and will complete 03/17/24.  PATIENT GOALS:   to be educated about the signs and symptoms of lymphedema and learn post op HEP.   PAIN:  Are you having pain? Yes: NPRS scale: 10/10 Pain location: mouth Pain description: burning Aggravating factors: food Relieving factors: not eating, salt water rinses  PRECAUTIONS: Active CA  RED FLAGS: None   WEIGHT BEARING RESTRICTIONS: No  FALLS:   Has patient fallen in last 6 months? No Does the patient have a fear of falling that limits activity? No Is the patient reluctant to leave the house due to a fear of falling?No  LIVING ENVIRONMENT: Patient lives with: wife and kids aged 84 and 7 Lives in: House/apartment Has following equipment at home: None  OCCUPATION: on medical leave- retail banker   LEISURE: outside in yard daily, rides bikes (dirt bikes, mountain bikes), plays ball with kids  PRIOR LEVEL OF FUNCTION: Independent   OBJECTIVE: Note: Objective measures were completed at Evaluation unless otherwise noted.  COGNITION: Overall cognitive status: Within functional limits for tasks assessed                  POSTURE:  Forward head and rounded shoulders posture  30 SEC SIT TO STAND: 20 reps in 30 sec without use of UEs which is  Poor for patient's age. Pt reports he felt he could have completed more.   SHOULDER AROM:   Impaired  R shoulder limited post neck dissection surgery - will measure at next session   CERVICAL AROM:   Percent limited  Flexion [facility]  Extension WFL  Right lateral flexion 25% limited  Left lateral flexion 35% limited  Right rotation WFL  Left rotation WFL    (Blank rows=not tested)  UPPER EXTREMITY AROM/PROM:  A/PROM RIGHT   eval  RIGHT 02/24/24 RIGHT 03/08/24  Shoulder extension 68 62   Shoulder flexion 128 130 141  Shoulder abduction 75 65 109  Shoulder internal rotation Unable  Unable   Shoulder external rotation unable Unable     (Blank rows = not tested)  A/PROM LEFT   eval  Shoulder extension 70  Shoulder flexion 144  Shoulder abduction 157  Shoulder internal rotation 65  Shoulder external rotation 85    (Blank rows = not tested)  Neck Disability Index score: 19 / 50 = 38.0 %  TREATMENT PERFORMED: 03/08/24: Measured bilateral shoulder ROM - a lot of improvement in ROM compared to last session Therapeutic Exercise: Ball up wall x 10 reps in direction of  flexion with v/c to engage scapular muscles throughout to prevent hiking Pulleys x 2 min in direction of flexion with v/c throughout for form and to keep from hiking and 1 min in direction of abduction but stopped early due to inability to control shoulder hiking despite various degrees of abduction and scaption Green theraband hooked in door with knot pulling out and down x 10  Therapeutic Activity: Supine over full foam roller:  Supine scapular series with red band x 10 reps each with pt returning therapist demo: narrow and wide grip flexion, horizontal abduction, ER, and diagonals with v/c to keep scapular muscles engaged  Bilateral scaption x 10 reps Dual Cable Machine: Scapular retraction x 18 reps x 10 lbs with focus on engaging scapular muscles Shoulder extension with arms at 6, 7 lbs x 13 reps Shoulder ER arm at 7 on R x 10 reps with 3 lbs  Push/pull with pt returning therapist demo with 7 lbs x 10 reps  bilaterally  02/24/24: Measured bilateral shoulder ROM Therapeutic Exercise: Ball up wall x 10 reps in direction of flexion with v/c to engage scapular muscles throughout to prevent hiking Pulleys x 2 min in direction of flexion with v/c throughout for form and to keep from hiking and 1 min in direction of abduction but stopped early due to inability to control shoulder hiking despite various degrees of abduction and scaption Therapeutic Activity: Supine over full foam roller:  Supine scapular series with yellow band x 10 reps each with pt returning therapist demo: narrow and wide grip flexion, horizontal abduction, ER, and diagonals with v/c to keep scapular muscles engaged  Alternating flexion x 10 reps, bilateral scaption x 10 reps, horizontal abduction x 10 reps, snow angels x 10 reps  2 min stretch in horizontal abduction with therapist assisting by helping keep arm abducted so pt could feel stretch  Dual Cable Machine: Scapular retraction x 10 reps x 7 lbs with focus on engaging  scapular muscles Shoulder extension with arms at 6, 3 lbs x 10 reps Shoulder ER on R x 10 reps with 3 lbs   02/17/24: Measured bialteral shoulder ROM  Therapeutic Exercise: Pulleys x 2 min in direction of flexion and 2 min in direction of abduction with v/c to keep shoulder relaxed  Therapeutic Activity: Ball up wall x 10 reps in direction of flexion with v/c and t/c to keep scapular muscle engaged throughout and then 5 reps in to R shoulder abduction - stopped due to pain in joint L sidelying: R shoulder abduction with therapist manual assist to keep shoulder from going forward with v/c to keep scapular muscles engaged x 10 with the last few pt able to complete without therapist assist with good control, then R shoulder flexion x 10 reps with initial manual assist but then pt able to complete without assist with good control, ER with towel under elbow x 5 reps with 2 lbs and then 5 with 1 lb (decreased due to discomfort at feeding tube) Seated: scapular retraction with red band x 10 with good activation of scapular muscles Shoulder shrugs with 2 lbs with initial t/c from therapist for shoulder positioning but then pt able to control shoulder x 10 reps  PATIENT EDUCATION:  Education details: how to control scapular muscles to avoid compensation to decrease pain, new HEP Person educated: Patient Education method: Explanation, Demonstration, Handout Education comprehension: Patient verbalized understanding and returned demonstration  HOME EXERCISE PROGRAM: Neck ROM exercises   Access Code: [email or web address] URL: [email or web address] Date: 02/17/2024 Prepared by: Florina Lanis Carbon  Exercises - Sidelying Shoulder External Rotation (Mirrored)  - 1 x daily - 7 x weekly - 1 sets - 10 reps - Sidelying Shoulder Abduction Palm Forward  - 1 x daily - 7 x weekly - 1 sets - 10 reps - Sidelying Shoulder Horizontal Abduction  - 1 x daily - 7 x weekly - 1 sets - 10 reps - Sidelying  Shoulder Flexion 15 Degrees (Mirrored)  - 1 x daily - 7 x weekly - 1 sets - 10 reps - Standing Shoulder Shrug and Retraction with Resistance  - 1 x daily - 7 x weekly - 1 sets - 10 reps - Standing Shoulder Row with Anchored Resistance  - 1 x daily - 7 x weekly - 1 sets - 10 reps  Supine Scapular Series  ASSESSMENT:  CLINICAL IMPRESSION: Pt's ROM has improved greatly since last session especially in direction of abduction. He has been compliant with his home  exercise program. He was able to tolerate increased resistance today with dual cable machine exercises and is demonstrating improved control of scapula.   Pt will benefit from skilled therapeutic intervention to improve on the following deficits: Decreased knowledge of precautions and postural dysfunction. Other deficits: decreased ROM, decreased strength, increased fascial restrictions, impaired UE functional use, postural dysfunction, and pain  PT treatment/interventions: ADL/self-care home management, pt/family education, therapeutic exercise. Other interventions 97164- PT Re-evaluation, 97110-Therapeutic exercises, 97530- Therapeutic activity, [id number]- Neuromuscular re-education, 97535- Self Care, [id number]- Manual therapy, 97760- Orthotic Initial, [phone]- Orthotic/Prosthetic subsequent, and Patient/Family education  REHAB POTENTIAL: Good  CLINICAL DECISION MAKING: Evolving/moderate complexity  EVALUATION COMPLEXITY: Moderate   GOALS: Goals reviewed with patient? YES  LONG TERM GOALS: (STG=LTG)   Name Target Date  Goal status  1 Patient will be able to verbalize understanding of a home exercise program for cervical range of motion, posture, and walking.   Baseline:  No knowledge 01/28/2024 Achieved at eval  2 Patient will be able to verbalize understanding of proper sitting and standing posture. Baseline:  No knowledge 01/28/2024 Achieved at eval  3 Patient will be able to verbalize understanding of lymphedema risk and availability  of treatment for this condition Baseline:  No knowledge 01/28/2024 Achieved at eval  4 Pt will demonstrate a return to full cervical ROM and function post operatively compared to baselines and not demonstrate any signs or symptoms of lymphedema.  Baseline: See objective measurements taken today. 03/30/24 New  5 Pt will demonstrate 150 degrees of L shoulder flexion to allow him to reach overhead.  03/30/24 NEW  6 Pt will demonstrate 150 degrees of L shoulder abduction to allow him to reach out to the side. 03/30/24 NEW   7 Pt will report a 50% improvement in R shoulder pain to allow improved comfort and function.   03/30/24 NEW  8 Pt will be independent in a home exercise program for continued stretching and strengtheing.   03/30/24 NEW    PLAN:  PT FREQUENCY/DURATION: 1x/wk for 6 weeks starting 02/17/24  PLAN FOR NEXT SESSION: measure R shoulder ROM, cont instruction on proper gleno humeral rhythm, pulleys, ball, side lying exercises, scap mobs    Physical Therapy Information for During and After Head/Neck Cancer Treatment: Lymphedema is a swelling condition that you may be at risk for in your neck and/or face if you have radiation treatment to the area and/or if you have surgery that includes removing lymph nodes.  There is treatment available for this condition and it is not life-threatening.  Contact your physician or physical therapist with concerns. An excellent resource for those seeking information on lymphedema is the National Lymphedema Network's website.  It can be accessed at www.lymphnet.org If you notice swelling in your neck or face at any time following surgery (even if it is many years from now), please contact your doctor or physical therapist to discuss this.  Lymphedema can be treated at any time but it is easier for you if it is treated early on. If you have had surgery to your neck, please check with your surgeon about how soon to start doing neck range of motion exercises.   If you are not having surgery, I encourage you to start doing neck range of motion exercises today and continue these while undergoing treatment, UNLESS you have irritation of your skin or soft tissue that is aggravated by doing them.  These exercises are intended to help you prevent loss of range of motion and/or to  gain range of motion in your neck (which can be limited by tightening effects of radiation), and NOT to aggravate these tissues if they develop sensitivities from treatment. Neck range of motion exercises should be done to the point of feeling a GENTLE, TOLERABLE stretch only.  You are encouraged to start a walking or other exercise program tomorrow and continue this as much as you are able through and after treatment.  Please feel free to call me with any questions. Florina Lanis Carbon, PT, CLT Physical Therapist and Certified Lymphedema Therapist [facility] [address], [address], [place], [place] [address] [phone] [email or web address]  WALKING  Walking is a great form of exercise to increase your strength, endurance and overall fitness.  A walking program can help you start slowly and gradually build endurance as you go.  Everyone's ability is different, so each person's starting point will be different.  You do not have to follow them exactly.  The are just samples. You should simply find out what's right for you and stick to that program.   In the beginning, you'll start off walking 2-3 times a day for short distances.  As you get stronger, you'll be walking further at just 1-2 times per day.  A. You Can Walk For A Certain Length Of Time Each Day    Walk 5 minutes 3 times per day.  Increase 2 minutes every 2 days (3 times per day).  Work up to 25-30 minutes (1-2 times per day).   Example:   Day 1-2 5 minutes 3 times per day   Day 7-8 12 minutes 2-3 times per day   Day 13-14 25 minutes 1-2 times per day  B. You Can Walk For a  Certain Distance Each Day     Distance can be substituted for time.    Example:   3 trips to mailbox (at road)   3 trips to corner of block   3 trips around the block  C. Go to local high school and use the track.    Walk for distance ____ around track  Or time ____ minutes  D. Walk ____ Jog ____ Run ___   Why exercise?  So many benefits! Here are SOME of them: Heart health, including raising your good cholesterol level and reducing heart rate and blood pressure Lung health, including improved lung capacity It burns fats, and most of us  can stand to be leaner, whether or not we are overweight. It increases the body's natural painkillers and mood elevators, so makes you feel better. Not only makes you feel better, but look better too Improves sleep Takes a bite out of stress May decrease your risk of many types of cancer If you are currently undergoing cancer treatment, exercise may improve your ability to tolerate treatments including chemotherapy. For everybody, it can improve your energy level. Those with cancer-related fatigue report a 40-50% reduction in this symptom when exercising regularly. If you are a survivor of breast, colon, or prostate cancer, it may decrease your risk of a recurrence. (This may hold for other cancers too, but so far we have data just for these three types.)  How to exercise: Get your doctor's okay. Pick something you enjoy doing, like walking, Zumba, biking, swimming, or whatever. Start at low intensity and time, then gradually increase.  (See walking program handout.) Set a goal to achieve over time.  The American Cancer Society, American Heart Association, and U.S. Dept. of Health and Human Services recommend 150  minutes of moderate exercise, 75 minutes of vigorous exercise, or a combination of both per week. This should be done in episodes at least 10 minutes long, spread throughout the week.  Need help being motivated? Pick something you enjoy  doing, because you'll be more inclined to stick with that activity than something that feels like a chore. Do it with a friend so that you are accountable to each other. Schedule it into your day. Place it on your calendar and keep that appointment just like you do any appointment that you make. Join an exercise group that meets at a specific time.  That way, you have to show up on time, and that makes it harder to procrastinate about doing your workout.  It also keeps you accountable--people begin to expect you to be there. Join a gym where you feel comfortable and not intimidated, at the right cost. Sign up for something that you'll need to be in shape for on a specific date, like a 1K or a 5K to walk or run, a 20 or 30 mile bike ride, a mud run or something like that. If the date is looming, you know you'll need to train to be ready for it.  An added benefit is that many of these are fundraisers for good causes. If you've already paid for a gym membership, group exercise class or event, you might as well work out, so you haven't wasted your money!    [employer] [place], PT 03/08/2024, 1:03 PM                       "

## 2024-03-09 ENCOUNTER — Ambulatory Visit
Admission: RE | Admit: 2024-03-09 | Discharge: 2024-03-09 | Disposition: A | Discharge status: Home / Self Care | Source: Ambulatory Visit | Attending: Radiation Oncology | Admitting: Radiation Oncology

## 2024-03-09 ENCOUNTER — Other Ambulatory Visit: Payer: Self-pay | Admitting: Radiation Oncology

## 2024-03-09 ENCOUNTER — Other Ambulatory Visit: Payer: Self-pay

## 2024-03-09 DIAGNOSIS — G893 Neoplasm related pain (acute) (chronic): Secondary | ICD-10-CM

## 2024-03-09 DIAGNOSIS — Z510 Encounter for antineoplastic radiation therapy: Secondary | ICD-10-CM | POA: Diagnosis not present

## 2024-03-09 LAB — RAD ONC ARIA SESSION SUMMARY
Course Elapsed Days: 42
Plan Fractions Treated to Date: 27
Plan Prescribed Dose Per Fraction: 2 Gy
Plan Total Fractions Prescribed: 33
Plan Total Prescribed Dose: 66 Gy
Reference Point Dosage Given to Date: 54 Gy
Reference Point Session Dosage Given: 2 Gy
Session Number: 27

## 2024-03-09 MED ORDER — TRAMADOL HCL 50 MG PO TABS: 50.0000 mg | ORAL_TABLET | Freq: Four times a day (QID) | ORAL | 0 refills | Status: DC | PRN

## 2024-03-11 ENCOUNTER — Inpatient Hospital Stay: Payer: Self-pay

## 2024-03-11 ENCOUNTER — Ambulatory Visit
Admission: RE | Admit: 2024-03-11 | Discharge: 2024-03-11 | Disposition: A | Discharge status: Home / Self Care | Payer: Self-pay | Source: Ambulatory Visit | Attending: Radiation Oncology | Admitting: Radiation Oncology

## 2024-03-11 ENCOUNTER — Other Ambulatory Visit: Payer: Self-pay

## 2024-03-11 ENCOUNTER — Inpatient Hospital Stay: Payer: Self-pay | Admitting: Nutrition

## 2024-03-11 ENCOUNTER — Encounter: Payer: Self-pay | Admitting: Oncology

## 2024-03-11 VITALS — BP 143/76 | HR 96 | Temp 98.5°F | Resp 17 | Wt 149.0 lb

## 2024-03-11 DIAGNOSIS — Z7952 Long term (current) use of systemic steroids: Secondary | ICD-10-CM | POA: Insufficient documentation

## 2024-03-11 DIAGNOSIS — Z79899 Other long term (current) drug therapy: Secondary | ICD-10-CM | POA: Insufficient documentation

## 2024-03-11 DIAGNOSIS — K1233 Oral mucositis (ulcerative) due to radiation: Secondary | ICD-10-CM | POA: Insufficient documentation

## 2024-03-11 DIAGNOSIS — C023 Malignant neoplasm of anterior two-thirds of tongue, part unspecified: Secondary | ICD-10-CM | POA: Insufficient documentation

## 2024-03-11 DIAGNOSIS — K1230 Oral mucositis (ulcerative), unspecified: Secondary | ICD-10-CM | POA: Insufficient documentation

## 2024-03-11 DIAGNOSIS — D701 Agranulocytosis secondary to cancer chemotherapy: Secondary | ICD-10-CM | POA: Insufficient documentation

## 2024-03-11 DIAGNOSIS — Z5111 Encounter for antineoplastic chemotherapy: Secondary | ICD-10-CM | POA: Insufficient documentation

## 2024-03-11 DIAGNOSIS — C021 Malignant neoplasm of border of tongue: Secondary | ICD-10-CM | POA: Insufficient documentation

## 2024-03-11 DIAGNOSIS — Z923 Personal history of irradiation: Secondary | ICD-10-CM | POA: Insufficient documentation

## 2024-03-11 DIAGNOSIS — L089 Local infection of the skin and subcutaneous tissue, unspecified: Secondary | ICD-10-CM | POA: Insufficient documentation

## 2024-03-11 DIAGNOSIS — F319 Bipolar disorder, unspecified: Secondary | ICD-10-CM | POA: Insufficient documentation

## 2024-03-11 DIAGNOSIS — C770 Secondary and unspecified malignant neoplasm of lymph nodes of head, face and neck: Secondary | ICD-10-CM | POA: Insufficient documentation

## 2024-03-11 DIAGNOSIS — Z808 Family history of malignant neoplasm of other organs or systems: Secondary | ICD-10-CM | POA: Insufficient documentation

## 2024-03-11 DIAGNOSIS — T451X5A Adverse effect of antineoplastic and immunosuppressive drugs, initial encounter: Secondary | ICD-10-CM | POA: Insufficient documentation

## 2024-03-11 DIAGNOSIS — Z510 Encounter for antineoplastic radiation therapy: Secondary | ICD-10-CM | POA: Insufficient documentation

## 2024-03-11 LAB — CBC WITH DIFFERENTIAL (CANCER CENTER ONLY)
Abs Immature Granulocytes: 0.01 K/uL (ref 0.00–0.07)
Basophils Absolute: 0 K/uL (ref 0.0–0.1)
Basophils Relative: 1 %
Eosinophils Absolute: 0.1 K/uL (ref 0.0–0.5)
Eosinophils Relative: 2 %
HCT: 36.2 % — ABNORMAL LOW (ref 39.0–52.0)
Hemoglobin: 12.7 g/dL — ABNORMAL LOW (ref 13.0–17.0)
Immature Granulocytes: 0 %
Lymphocytes Relative: 20 %
Lymphs Abs: 0.6 K/uL — ABNORMAL LOW (ref 0.7–4.0)
MCH: 31.3 pg (ref 26.0–34.0)
MCHC: 35.1 g/dL (ref 30.0–36.0)
MCV: 89.2 fL (ref 80.0–100.0)
Monocytes Absolute: 0.4 K/uL (ref 0.1–1.0)
Monocytes Relative: 13 %
Neutro Abs: 1.8 K/uL (ref 1.7–7.7)
Neutrophils Relative %: 64 %
Platelet Count: 168 K/uL (ref 150–400)
RBC: 4.06 MIL/uL — ABNORMAL LOW (ref 4.22–5.81)
RDW: 13.2 % (ref 11.5–15.5)
WBC Count: 2.8 K/uL — ABNORMAL LOW (ref 4.0–10.5)
nRBC: 0 % (ref 0.0–0.2)

## 2024-03-11 LAB — RAD ONC ARIA SESSION SUMMARY
Course Elapsed Days: 44
Plan Fractions Treated to Date: 28
Plan Prescribed Dose Per Fraction: 2 Gy
Plan Total Fractions Prescribed: 33
Plan Total Prescribed Dose: 66 Gy
Reference Point Dosage Given to Date: 56 Gy
Reference Point Session Dosage Given: 2 Gy
Session Number: 28

## 2024-03-11 LAB — BASIC METABOLIC PANEL - CANCER CENTER ONLY
Anion gap: 10 (ref 5–15)
BUN: 15 mg/dL (ref 6–20)
CO2: 28 mmol/L (ref 22–32)
Calcium: 9.6 mg/dL (ref 8.9–10.3)
Chloride: 102 mmol/L (ref 98–111)
Creatinine: 0.79 mg/dL (ref 0.61–1.24)
GFR, Estimated: >60 mL/min
Glucose, Bld: 111 mg/dL — ABNORMAL HIGH (ref 70–99)
Potassium: 4 mmol/L (ref 3.5–5.1)
Sodium: 139 mmol/L (ref 135–145)

## 2024-03-11 LAB — MAGNESIUM: Magnesium: 2 mg/dL (ref 1.7–2.4)

## 2024-03-11 MED ORDER — SODIUM CHLORIDE 0.9 % IV SOLN
40.0000 mg/m2 | Freq: Once | INTRAVENOUS | Status: AC
  Administered 2024-03-11: 74 mg via INTRAVENOUS
  Filled 2024-03-11: qty 74

## 2024-03-11 MED ORDER — PALONOSETRON HCL INJECTION 0.25 MG/5ML
0.2500 mg | Freq: Once | INTRAVENOUS | Status: AC
  Administered 2024-03-11: 0.25 mg via INTRAVENOUS
  Filled 2024-03-11: qty 5

## 2024-03-11 MED ORDER — POTASSIUM CHLORIDE IN NACL 20-0.9 MEQ/L-% IV SOLN
Freq: Once | INTRAVENOUS | Status: AC
  Filled 2024-03-11: qty 1000

## 2024-03-11 MED ORDER — MAGNESIUM SULFATE 2 GM/50ML IV SOLN
2.0000 g | Freq: Once | INTRAVENOUS | Status: AC
  Administered 2024-03-11: 2 g via INTRAVENOUS
  Filled 2024-03-11: qty 50

## 2024-03-11 MED ORDER — SODIUM CHLORIDE 0.9 % IV SOLN
150.0000 mg | Freq: Once | INTRAVENOUS | Status: AC
  Administered 2024-03-11: 150 mg via INTRAVENOUS
  Filled 2024-03-11: qty 150

## 2024-03-11 MED ORDER — SODIUM CHLORIDE 0.9 % IV SOLN: INTRAVENOUS | Status: DC

## 2024-03-11 MED ORDER — DEXAMETHASONE SOD PHOSPHATE PF 10 MG/ML IJ SOLN
10.0000 mg | Freq: Once | INTRAMUSCULAR | Status: AC
  Administered 2024-03-11: 10 mg via INTRAVENOUS

## 2024-03-11 NOTE — Patient Instructions (Signed)
 [facility] - A DEPT OF [facility]  Discharge Instructions: Thank you for choosing [facility] to provide your oncology and hematology care.   If you have a lab appointment with the Cancer Center, please go directly to the Cancer Center and check in at the registration area.   Wear comfortable clothing and clothing appropriate for easy access to any Portacath or PICC line.   We strive to give you quality time with your provider. You may need to reschedule your appointment if you arrive late (15 or more minutes).  Arriving late affects you and other patients whose appointments are after yours.  Also, if you miss three or more appointments without notifying the office, you may be dismissed from the clinic at the provider's discretion.      For prescription refill requests, have your pharmacy contact our office and allow 72 hours for refills to be completed.    Today you received the following chemotherapy and/or immunotherapy agents: Cisplatin  (Platinol )    To help prevent nausea and vomiting after your treatment, we encourage you to take your nausea medication as directed.  BELOW ARE SYMPTOMS THAT SHOULD BE REPORTED IMMEDIATELY: *FEVER GREATER THAN 100.4 F (38 C) OR HIGHER *CHILLS OR SWEATING *NAUSEA AND VOMITING THAT IS NOT CONTROLLED WITH YOUR NAUSEA MEDICATION *UNUSUAL SHORTNESS OF BREATH *UNUSUAL BRUISING OR BLEEDING *URINARY PROBLEMS (pain or burning when urinating, or frequent urination) *BOWEL PROBLEMS (unusual diarrhea, constipation, pain near the anus) TENDERNESS IN MOUTH AND THROAT WITH OR WITHOUT PRESENCE OF ULCERS (sore throat, sores in mouth, or a toothache) UNUSUAL RASH, SWELLING OR PAIN  UNUSUAL VAGINAL DISCHARGE OR ITCHING   Items with * indicate a potential emergency and should be followed up as soon as possible or go to the Emergency Department if any problems should occur.  Please show the CHEMOTHERAPY ALERT CARD or  IMMUNOTHERAPY ALERT CARD at check-in to the Emergency Department and triage nurse.  Should you have questions after your visit or need to cancel or reschedule your appointment, please contact [facility] - A DEPT OF [facility]  Dept: [phone]  and follow the prompts.  Office hours are 8:00 a.m. to 4:30 p.m. Monday - Friday. Please note that voicemails left after 4:00 p.m. may not be returned until the following business day.  We are closed weekends and major holidays. You have access to a nurse at all times for urgent questions. Please call the main number to the clinic Dept: [phone] and follow the prompts.   For any non-urgent questions, you may also contact your provider using MyChart. We now offer e-Visits for anyone 42 and older to request care online for non-urgent symptoms. For details visit [email or web address].   Also download the MyChart app! Go to the app store, search MyChart, open the app, select , and log in with your MyChart username and password.

## 2024-03-11 NOTE — Progress Notes (Signed)
 Nutrition follow-up completed with patient and wife during infusion for SCC of the right two thirds of tongue.  Patient receives concurrent chemoradiation therapy and is followed by Dr. Autumn and Dr. Izell.  Final radiation therapy is scheduled for January 9.  G-tube: 14 French placed December 5 DME: [employer] [facility]  Weight:  149 pounds January 2 150 pounds 8 ounces December 19 155 pounds December 12 157 pounds December 5  5% weight loss over 1 month  Labs include Magnesium  2.0, glucose 111  Estimated nutrition needs: 2365-2580 cal, 93-115 g protein, greater than 2 L fluid.  7 cartons Nutren 1.5 provides 2625 cal and 119 g protein.  Patient receives a total of 2617 mL of free water with tube feeding and free water flushes.  This is providing 100% estimated needs.  Patient reports that he has  increased tube feeding to goal rate to meet 100% estimated needs.  He has been unable to continue oral food intake due to mucositis.  He still tries to drink liquids, primarily water.  Denies issues with his feeding tube.  He has adequate tube feeding supplies and formula.  He denies nutrition impact symptoms.  Nutrition diagnosis: Inadequate oral intake, ongoing  Intervention: Continue 7 cartons Nutren 1.5 via G-tube to provide 100% estimated needs. Continue free water flushes. Encouraged to continue liquids or soft solids by mouth as tolerated. Provided support and encouragement.  Monitoring, evaluation, goals: Patient will tolerate adequate calories and protein to minimize further weight loss.  Next visit:  Will schedule nutrition follow-up with upcoming appointments.  Patient has RD contact information if needs arise prior to next follow-up.  **Disclaimer: This note was dictated with voice recognition software. Similar sounding words can inadvertently be transcribed and this note may contain transcription errors which may not have been corrected upon publication of note.**

## 2024-03-14 ENCOUNTER — Ambulatory Visit
Admission: RE | Admit: 2024-03-14 | Discharge: 2024-03-14 | Disposition: A | Discharge status: Home / Self Care | Payer: Self-pay | Source: Ambulatory Visit | Attending: Radiation Oncology | Admitting: Radiation Oncology

## 2024-03-14 ENCOUNTER — Other Ambulatory Visit: Payer: Self-pay

## 2024-03-14 LAB — RAD ONC ARIA SESSION SUMMARY
Course Elapsed Days: 47
Plan Fractions Treated to Date: 29
Plan Prescribed Dose Per Fraction: 2 Gy
Plan Total Fractions Prescribed: 33
Plan Total Prescribed Dose: 66 Gy
Reference Point Dosage Given to Date: 58 Gy
Reference Point Session Dosage Given: 2 Gy
Session Number: 29

## 2024-03-15 ENCOUNTER — Ambulatory Visit: Payer: Self-pay | Attending: Radiation Oncology | Admitting: Physical Therapy

## 2024-03-15 ENCOUNTER — Ambulatory Visit
Admission: RE | Admit: 2024-03-15 | Discharge: 2024-03-15 | Disposition: A | Discharge status: Home / Self Care | Payer: Self-pay | Source: Ambulatory Visit | Attending: Radiation Oncology | Admitting: Radiation Oncology

## 2024-03-15 ENCOUNTER — Encounter: Payer: Self-pay | Admitting: Physical Therapy

## 2024-03-15 ENCOUNTER — Other Ambulatory Visit: Payer: Self-pay

## 2024-03-15 DIAGNOSIS — M25611 Stiffness of right shoulder, not elsewhere classified: Secondary | ICD-10-CM | POA: Diagnosis present

## 2024-03-15 DIAGNOSIS — C023 Malignant neoplasm of anterior two-thirds of tongue, part unspecified: Secondary | ICD-10-CM | POA: Diagnosis present

## 2024-03-15 DIAGNOSIS — R293 Abnormal posture: Secondary | ICD-10-CM | POA: Diagnosis present

## 2024-03-15 DIAGNOSIS — M25511 Pain in right shoulder: Secondary | ICD-10-CM | POA: Insufficient documentation

## 2024-03-15 LAB — RAD ONC ARIA SESSION SUMMARY
Course Elapsed Days: 48
Plan Fractions Treated to Date: 30
Plan Prescribed Dose Per Fraction: 2 Gy
Plan Total Fractions Prescribed: 33
Plan Total Prescribed Dose: 66 Gy
Reference Point Dosage Given to Date: 60 Gy
Reference Point Session Dosage Given: 2 Gy
Session Number: 30

## 2024-03-15 NOTE — Therapy (Signed)
 " OUTPATIENT PHYSICAL THERAPY HEAD AND NECK BASELINE TREATMENT   Patient Name: Benjamin Choi MRN: [id number] DOB:Apr 18, 1990, 34 y.o., male Today's Date: 03/15/2024  END OF SESSION:  PT End of Session - 03/15/24 1005     Visit Number 5    Number of Visits 7    Date for Recertification  03/30/24    PT Start Time 1004    PT Stop Time 1025   pt requested to end session early   PT Time Calculation (min) 21 min    Activity Tolerance Patient limited by fatigue    Behavior During Therapy [facility] for tasks assessed/performed          Past Medical History:  Diagnosis Date   Bipolar disorder (HCC)    Depression    Past Surgical History:  Procedure Laterality Date   IR GASTROSTOMY TUBE MOD SED  02/12/2024   IR IMAGING GUIDED PORT INSERTION  01/26/2024   Patient Active Problem List   Diagnosis Date Noted   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) 01/03/2024   Anxiety 11/02/2023   Tongue cancer (HCC) 11/02/2023   Radicular pain in left arm 04/22/2023   Allergic rhinitis 04/22/2023   Tongue lesion 02/16/2023   Puncture wound 12/15/2022   Screening for lipid disorders 06/24/2022   Preventative health care 06/24/2022   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 I did not do any work outs due to chemo.   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 He presented to his PCP in Dec  2024 with a painful sore on his right lateral tongue. He was given triamcinolone  paste without relief and returned May 2025 with persistent pain from the lesion and tried magic mouthwash again without relief. He saw a dentist who shaved down a sharp molar to prevent irritation to his tongue. Given persistent pain to the tongue lesion a biopsy was done 10/27/23 which showed findings consistent with keratinizing invasive SCC. He was referred to [facility]. 11/10/23 Consult with Dr. Sallyann. Exam at that time confirmed a 2 cm tender ulcerated right lateral tongue mass. Laryngoscopy negative and neck negative for cervical lymphadenopathy. 11/22/13 CT neck/chest at [facility] which collectively demonstrated: no definite evidence of invasive of local invasion within the soft tissues of the neck, with bilateral submental and jugular chain lymph nodes measure up to 7 mm in thickness, within normal limits. CT findings were also negative for metastatic disease within the chest.12/03/23 he opted to proceed with a partial glossectomy with right cervical lymph node dissections on 12/03/23. Pathology from the procedure showed: tumor the size of 1.3 cm; histology of moderately differentiated invasive squamous cell carcinoma of the tongue (keratinizing) invading a depth of 0.8 cm; positive for focal LVI and intratumoral PNI; Margin positive, nodal status of 29/29 excised lymph nodes negative for carcinoma. He will receive 33 fractions of radiation to his right tongue and right neck with weekly chemotherapy which started on 01/27/24  and will complete 03/17/24.  PATIENT GOALS:   to be educated about the signs and symptoms of lymphedema and learn post op HEP.   PAIN:  Are you having pain? Yes: NPRS scale: 7/10 Pain location: mouth Pain description: burning Aggravating factors: food Relieving factors: not eating, salt water rinses  PRECAUTIONS: Active CA  RED FLAGS: None   WEIGHT BEARING RESTRICTIONS: No  FALLS:  Has patient fallen  in last 6 months? No Does the patient have a fear of falling that limits activity? No Is the patient reluctant to leave the house due to a fear of falling?No  LIVING ENVIRONMENT: Patient lives with: wife and kids aged 49 and 7 Lives in: House/apartment Has following equipment at home: None  OCCUPATION: on medical leave- retail banker   LEISURE: outside in yard daily, rides bikes (dirt bikes, mountain bikes), plays ball with kids  PRIOR LEVEL OF FUNCTION: Independent   OBJECTIVE: Note: Objective measures were completed at Evaluation unless otherwise noted.  COGNITION: Overall cognitive status: Within functional limits for tasks assessed                  POSTURE:  Forward head and rounded shoulders posture  30 SEC SIT TO STAND: 20 reps in 30 sec without use of UEs which is  Poor for patient's age. Pt reports he felt he could have completed more.   SHOULDER AROM:   Impaired  R shoulder limited post neck dissection surgery - will measure at next session   CERVICAL AROM:   Percent limited  Flexion [facility]  Extension WFL  Right lateral flexion 25% limited  Left lateral flexion 35% limited  Right rotation WFL  Left rotation WFL    (Blank rows=not tested)  UPPER EXTREMITY AROM/PROM:  A/PROM RIGHT   eval  RIGHT 02/24/24 RIGHT 03/08/24  Shoulder extension 68 62   Shoulder flexion 128 130 141  Shoulder abduction 75 65 109  Shoulder internal rotation Unable  Unable   Shoulder external rotation unable Unable     (Blank rows = not tested)  A/PROM LEFT   eval  Shoulder extension 70  Shoulder flexion 144  Shoulder abduction 157  Shoulder internal rotation 65  Shoulder external rotation 85    (Blank rows = not tested)  Neck Disability Index score: 19 / 50 = 38.0 %  TREATMENT PERFORMED: 03/16/23: Therapeutic Exercise: Ball up wall x 10 reps in direction of flexion with v/c to engage scapular muscles throughout to prevent hiking Pulleys x 2 min in direction of  flexion and 1 minin direction of abduction with v/c to move through full ROM - stopped early due to fatigue Therapeutic Activity: Dual Cable Machine: Scapular retraction x 15 reps x 13 lbs with focus on engaging scapular muscles Shoulder extension with arms at 6, 7 lbs x 11 reps Shoulder ER arm at 7 on R x 10 reps with 3 lbs  Push/pull with pt returning therapist demo with 7 lbs x 10 reps bilaterally  03/08/24: Measured bilateral shoulder ROM - a lot of improvement in ROM compared to last session Therapeutic Exercise: Ball up wall x 10 reps in direction of flexion with v/c to engage scapular muscles throughout to prevent hiking Pulleys x 2 min in direction of flexion with v/c throughout for form and to keep from hiking and 1 min in direction of abduction but stopped early due to inability to control shoulder hiking despite various degrees of abduction and scaption Green theraband hooked in door with knot pulling  out and down x 10  Therapeutic Activity: Supine over full foam roller:  Supine scapular series with red band x 10 reps each with pt returning therapist demo: narrow and wide grip flexion, horizontal abduction, ER, and diagonals with v/c to keep scapular muscles engaged  Bilateral scaption x 10 reps Dual Cable Machine: Scapular retraction x 18 reps x 10 lbs with focus on engaging scapular muscles Shoulder extension with arms at 6, 7 lbs x 13 reps Shoulder ER arm at 7 on R x 10 reps with 3 lbs  Push/pull with pt returning therapist demo with 7 lbs x 10 reps bilaterally  02/24/24: Measured bilateral shoulder ROM Therapeutic Exercise: Ball up wall x 10 reps in direction of flexion with v/c to engage scapular muscles throughout to prevent hiking Pulleys x 2 min in direction of flexion with v/c throughout for form and to keep from hiking and 1 min in direction of abduction but stopped early due to inability to control shoulder hiking despite various degrees of abduction and  scaption Therapeutic Activity: Supine over full foam roller:  Supine scapular series with yellow band x 10 reps each with pt returning therapist demo: narrow and wide grip flexion, horizontal abduction, ER, and diagonals with v/c to keep scapular muscles engaged  Alternating flexion x 10 reps, bilateral scaption x 10 reps, horizontal abduction x 10 reps, snow angels x 10 reps  2 min stretch in horizontal abduction with therapist assisting by helping keep arm abducted so pt could feel stretch  Dual Cable Machine: Scapular retraction x 10 reps x 7 lbs with focus on engaging scapular muscles Shoulder extension with arms at 6, 3 lbs x 10 reps Shoulder ER on R x 10 reps with 3 lbs   02/17/24: Measured bialteral shoulder ROM  Therapeutic Exercise: Pulleys x 2 min in direction of flexion and 2 min in direction of abduction with v/c to keep shoulder relaxed  Therapeutic Activity: Ball up wall x 10 reps in direction of flexion with v/c and t/c to keep scapular muscle engaged throughout and then 5 reps in to R shoulder abduction - stopped due to pain in joint L sidelying: R shoulder abduction with therapist manual assist to keep shoulder from going forward with v/c to keep scapular muscles engaged x 10 with the last few pt able to complete without therapist assist with good control, then R shoulder flexion x 10 reps with initial manual assist but then pt able to complete without assist with good control, ER with towel under elbow x 5 reps with 2 lbs and then 5 with 1 lb (decreased due to discomfort at feeding tube) Seated: scapular retraction with red band x 10 with good activation of scapular muscles Shoulder shrugs with 2 lbs with initial t/c from therapist for shoulder positioning but then pt able to control shoulder x 10 reps  PATIENT EDUCATION:  Education details: how to control scapular muscles to avoid compensation to decrease pain, new HEP Person educated: Patient Education method:  Explanation, Demonstration, Handout Education comprehension: Patient verbalized understanding and returned demonstration  HOME EXERCISE PROGRAM: Neck ROM exercises   Access Code: [email or web address] URL: [email or web address] Date: 02/17/2024 Prepared by: Florina Lanis Carbon  Exercises - Sidelying Shoulder External Rotation (Mirrored)  - 1 x daily - 7 x weekly - 1 sets - 10 reps - Sidelying Shoulder Abduction Palm Forward  - 1 x daily - 7 x weekly - 1 sets - 10 reps - Sidelying Shoulder Horizontal Abduction  - 1 x daily -  7 x weekly - 1 sets - 10 reps - Sidelying Shoulder Flexion 15 Degrees (Mirrored)  - 1 x daily - 7 x weekly - 1 sets - 10 reps - Standing Shoulder Shrug and Retraction with Resistance  - 1 x daily - 7 x weekly - 1 sets - 10 reps - Standing Shoulder Row with Anchored Resistance  - 1 x daily - 7 x weekly - 1 sets - 10 reps  Supine Scapular Series  ASSESSMENT:  CLINICAL IMPRESSION: Pt reports he is feeling more fatigued from the chemo and is requested to end the session early today. He will completed his radiation on Friday. He was unable to complete his HEP since last session due to fatigue. Continuing to strengthening scapular musculature to improve ROM and strength.   Pt will benefit from skilled therapeutic intervention to improve on the following deficits: Decreased knowledge of precautions and postural dysfunction. Other deficits: decreased ROM, decreased strength, increased fascial restrictions, impaired UE functional use, postural dysfunction, and pain  PT treatment/interventions: ADL/self-care home management, pt/family education, therapeutic exercise. Other interventions 97164- PT Re-evaluation, 97110-Therapeutic exercises, 97530- Therapeutic activity, [id number]- Neuromuscular re-education, 97535- Self Care, [id number]- Manual therapy, 97760- Orthotic Initial, [phone]- Orthotic/Prosthetic subsequent, and Patient/Family education  REHAB POTENTIAL: Good  CLINICAL  DECISION MAKING: Evolving/moderate complexity  EVALUATION COMPLEXITY: Moderate   GOALS: Goals reviewed with patient? YES  LONG TERM GOALS: (STG=LTG)   Name Target Date  Goal status  1 Patient will be able to verbalize understanding of a home exercise program for cervical range of motion, posture, and walking.   Baseline:  No knowledge 01/28/2024 Achieved at eval  2 Patient will be able to verbalize understanding of proper sitting and standing posture. Baseline:  No knowledge 01/28/2024 Achieved at eval  3 Patient will be able to verbalize understanding of lymphedema risk and availability of treatment for this condition Baseline:  No knowledge 01/28/2024 Achieved at eval  4 Pt will demonstrate a return to full cervical ROM and function post operatively compared to baselines and not demonstrate any signs or symptoms of lymphedema.  Baseline: See objective measurements taken today. 03/30/24 New  5 Pt will demonstrate 150 degrees of L shoulder flexion to allow him to reach overhead.  03/30/24 NEW  6 Pt will demonstrate 150 degrees of L shoulder abduction to allow him to reach out to the side. 03/30/24 NEW   7 Pt will report a 50% improvement in R shoulder pain to allow improved comfort and function.   03/30/24 NEW  8 Pt will be independent in a home exercise program for continued stretching and strengtheing.   03/30/24 NEW    PLAN:  PT FREQUENCY/DURATION: 1x/wk for 6 weeks starting 02/17/24  PLAN FOR NEXT SESSION: measure R shoulder ROM, cont instruction on proper gleno humeral rhythm, pulleys, ball, side lying exercises, scap mobs    Physical Therapy Information for During and After Head/Neck Cancer Treatment: Lymphedema is a swelling condition that you may be at risk for in your neck and/or face if you have radiation treatment to the area and/or if you have surgery that includes removing lymph nodes.  There is treatment available for this condition and it is not life-threatening.   Contact your physician or physical therapist with concerns. An excellent resource for those seeking information on lymphedema is the National Lymphedema Network's website.  It can be accessed at www.lymphnet.org If you notice swelling in your neck or face at any time following surgery (even if it is many years from  now), please contact your doctor or physical therapist to discuss this.  Lymphedema can be treated at any time but it is easier for you if it is treated early on. If you have had surgery to your neck, please check with your surgeon about how soon to start doing neck range of motion exercises.  If you are not having surgery, I encourage you to start doing neck range of motion exercises today and continue these while undergoing treatment, UNLESS you have irritation of your skin or soft tissue that is aggravated by doing them.  These exercises are intended to help you prevent loss of range of motion and/or to gain range of motion in your neck (which can be limited by tightening effects of radiation), and NOT to aggravate these tissues if they develop sensitivities from treatment. Neck range of motion exercises should be done to the point of feeling a GENTLE, TOLERABLE stretch only.  You are encouraged to start a walking or other exercise program tomorrow and continue this as much as you are able through and after treatment.  Please feel free to call me with any questions. Florina Lanis Carbon, PT, CLT Physical Therapist and Certified Lymphedema Therapist [facility] [address], [place], [place] [address] [phone] [email or web address]  WALKING  Walking is a great form of exercise to increase your strength, endurance and overall fitness.  A walking program can help you start slowly and gradually build endurance as you go.  Everyone's ability is different, so each person's starting point will be different.  You do not have to follow them  exactly.  The are just samples. You should simply find out what's right for you and stick to that program.   In the beginning, you'll start off walking 2-3 times a day for short distances.  As you get stronger, you'll be walking further at just 1-2 times per day.  A. You Can Walk For A Certain Length Of Time Each Day    Walk 5 minutes 3 times per day.  Increase 2 minutes every 2 days (3 times per day).  Work up to 25-30 minutes (1-2 times per day).   Example:   Day 1-2 5 minutes 3 times per day   Day 7-8 12 minutes 2-3 times per day   Day 13-14 25 minutes 1-2 times per day  B. You Can Walk For a Certain Distance Each Day     Distance can be substituted for time.    Example:   3 trips to mailbox (at road)   3 trips to corner of block   3 trips around the block  C. Go to local high school and use the track.    Walk for distance ____ around track  Or time ____ minutes  D. Walk ____ Jog ____ Run ___   Why exercise?  So many benefits! Here are SOME of them: Heart health, including raising your good cholesterol level and reducing heart rate and blood pressure Lung health, including improved lung capacity It burns fats, and most of us  can stand to be leaner, whether or not we are overweight. It increases the body's natural painkillers and mood elevators, so makes you feel better. Not only makes you feel better, but look better too Improves sleep Takes a bite out of stress May decrease your risk of many types of cancer If you are currently undergoing cancer treatment, exercise may improve your ability to tolerate treatments including chemotherapy. For everybody, it can improve your  energy level. Those with cancer-related fatigue report a 40-50% reduction in this symptom when exercising regularly. If you are a survivor of breast, colon, or prostate cancer, it may decrease your risk of a recurrence. (This may hold for other cancers too, but so far we have data just for these three  types.)  How to exercise: Get your doctor's okay. Pick something you enjoy doing, like walking, Zumba, biking, swimming, or whatever. Start at low intensity and time, then gradually increase.  (See walking program handout.) Set a goal to achieve over time.  The American Cancer Society, American Heart Association, and U.S. Dept. of Health and Human Services recommend 150 minutes of moderate exercise, 75 minutes of vigorous exercise, or a combination of both per week. This should be done in episodes at least 10 minutes long, spread throughout the week.  Need help being motivated? Pick something you enjoy doing, because you'll be more inclined to stick with that activity than something that feels like a chore. Do it with a friend so that you are accountable to each other. Schedule it into your day. Place it on your calendar and keep that appointment just like you do any appointment that you make. Join an exercise group that meets at a specific time.  That way, you have to show up on time, and that makes it harder to procrastinate about doing your workout.  It also keeps you accountable--people begin to expect you to be there. Join a gym where you feel comfortable and not intimidated, at the right cost. Sign up for something that you'll need to be in shape for on a specific date, like a 1K or a 5K to walk or run, a 20 or 30 mile bike ride, a mud run or something like that. If the date is looming, you know you'll need to train to be ready for it.  An added benefit is that many of these are fundraisers for good causes. If you've already paid for a gym membership, group exercise class or event, you might as well work out, so you haven't wasted your money!    [facility] [place], PT 03/15/2024, 10:35 AM                       "

## 2024-03-16 ENCOUNTER — Ambulatory Visit
Admission: RE | Admit: 2024-03-16 | Discharge: 2024-03-16 | Disposition: A | Discharge status: Home / Self Care | Payer: Self-pay | Source: Ambulatory Visit | Attending: Radiation Oncology | Admitting: Radiation Oncology

## 2024-03-16 ENCOUNTER — Other Ambulatory Visit: Payer: Self-pay

## 2024-03-16 LAB — RAD ONC ARIA SESSION SUMMARY
Course Elapsed Days: 49
Plan Fractions Treated to Date: 31
Plan Prescribed Dose Per Fraction: 2 Gy
Plan Total Fractions Prescribed: 33
Plan Total Prescribed Dose: 66 Gy
Reference Point Dosage Given to Date: 62 Gy
Reference Point Session Dosage Given: 2 Gy
Session Number: 31

## 2024-03-17 ENCOUNTER — Ambulatory Visit
Admission: RE | Admit: 2024-03-17 | Discharge: 2024-03-17 | Disposition: A | Discharge status: Home / Self Care | Payer: Self-pay | Source: Ambulatory Visit | Attending: Radiation Oncology | Admitting: Radiation Oncology

## 2024-03-17 ENCOUNTER — Other Ambulatory Visit: Payer: Self-pay

## 2024-03-17 ENCOUNTER — Ambulatory Visit: Payer: Self-pay

## 2024-03-17 DIAGNOSIS — C023 Malignant neoplasm of anterior two-thirds of tongue, part unspecified: Secondary | ICD-10-CM

## 2024-03-17 DIAGNOSIS — C029 Malignant neoplasm of tongue, unspecified: Secondary | ICD-10-CM

## 2024-03-17 LAB — RAD ONC ARIA SESSION SUMMARY
Course Elapsed Days: 50
Plan Fractions Treated to Date: 32
Plan Prescribed Dose Per Fraction: 2 Gy
Plan Total Fractions Prescribed: 33
Plan Total Prescribed Dose: 66 Gy
Reference Point Dosage Given to Date: 64 Gy
Reference Point Session Dosage Given: 2 Gy
Session Number: 32

## 2024-03-18 ENCOUNTER — Encounter: Payer: Self-pay | Admitting: Oncology

## 2024-03-18 ENCOUNTER — Other Ambulatory Visit: Payer: Self-pay

## 2024-03-18 ENCOUNTER — Inpatient Hospital Stay (HOSPITAL_BASED_OUTPATIENT_CLINIC_OR_DEPARTMENT_OTHER): Payer: Self-pay | Admitting: Oncology

## 2024-03-18 ENCOUNTER — Ambulatory Visit
Admission: RE | Admit: 2024-03-18 | Discharge: 2024-03-18 | Disposition: A | Discharge status: Home / Self Care | Payer: Self-pay | Source: Ambulatory Visit | Attending: Radiation Oncology | Admitting: Radiation Oncology

## 2024-03-18 ENCOUNTER — Ambulatory Visit: Payer: Self-pay

## 2024-03-18 ENCOUNTER — Inpatient Hospital Stay: Payer: Self-pay

## 2024-03-18 VITALS — BP 102/71 | HR 102 | Temp 98.7°F | Resp 18 | Ht 69.0 in | Wt 150.0 lb

## 2024-03-18 DIAGNOSIS — C023 Malignant neoplasm of anterior two-thirds of tongue, part unspecified: Secondary | ICD-10-CM

## 2024-03-18 DIAGNOSIS — C029 Malignant neoplasm of tongue, unspecified: Secondary | ICD-10-CM

## 2024-03-18 LAB — RAD ONC ARIA SESSION SUMMARY
Course Elapsed Days: 51
Plan Fractions Treated to Date: 33
Plan Prescribed Dose Per Fraction: 2 Gy
Plan Total Fractions Prescribed: 33
Plan Total Prescribed Dose: 66 Gy
Reference Point Dosage Given to Date: 66 Gy
Reference Point Session Dosage Given: 2 Gy
Session Number: 33

## 2024-03-18 LAB — CBC WITH DIFFERENTIAL (CANCER CENTER ONLY)
Abs Immature Granulocytes: 0.02 K/uL (ref 0.00–0.07)
Basophils Absolute: 0 K/uL (ref 0.0–0.1)
Basophils Relative: 0 %
Eosinophils Absolute: 0 K/uL (ref 0.0–0.5)
Eosinophils Relative: 1 %
HCT: 37.1 % — ABNORMAL LOW (ref 39.0–52.0)
Hemoglobin: 13.4 g/dL (ref 13.0–17.0)
Immature Granulocytes: 1 %
Lymphocytes Relative: 19 %
Lymphs Abs: 0.7 K/uL (ref 0.7–4.0)
MCH: 31.8 pg (ref 26.0–34.0)
MCHC: 36.1 g/dL — ABNORMAL HIGH (ref 30.0–36.0)
MCV: 88.1 fL (ref 80.0–100.0)
Monocytes Absolute: 0.5 K/uL (ref 0.1–1.0)
Monocytes Relative: 13 %
Neutro Abs: 2.3 K/uL (ref 1.7–7.7)
Neutrophils Relative %: 66 %
Platelet Count: 216 K/uL (ref 150–400)
RBC: 4.21 MIL/uL — ABNORMAL LOW (ref 4.22–5.81)
RDW: 13.3 % (ref 11.5–15.5)
WBC Count: 3.5 K/uL — ABNORMAL LOW (ref 4.0–10.5)
nRBC: 0 % (ref 0.0–0.2)

## 2024-03-18 LAB — CMP (CANCER CENTER ONLY)
ALT: 29 U/L (ref 0–44)
AST: 22 U/L (ref 15–41)
Albumin: 4.3 g/dL (ref 3.5–5.0)
Alkaline Phosphatase: 67 U/L (ref 38–126)
Anion gap: 10 (ref 5–15)
BUN: 16 mg/dL (ref 6–20)
CO2: 28 mmol/L (ref 22–32)
Calcium: 9.5 mg/dL (ref 8.9–10.3)
Chloride: 100 mmol/L (ref 98–111)
Creatinine: 0.76 mg/dL (ref 0.61–1.24)
GFR, Estimated: >60 mL/min
Glucose, Bld: 109 mg/dL — ABNORMAL HIGH (ref 70–99)
Potassium: 4.1 mmol/L (ref 3.5–5.1)
Sodium: 137 mmol/L (ref 135–145)
Total Bilirubin: 0.5 mg/dL (ref 0.0–1.2)
Total Protein: 7.1 g/dL (ref 6.5–8.1)

## 2024-03-18 LAB — MAGNESIUM: Magnesium: 2.1 mg/dL (ref 1.7–2.4)

## 2024-03-18 NOTE — Progress Notes (Signed)
 Oncology Nurse Navigator Documentation   Met with Benjamin Choi and his wife after final RT to offer support and to celebrate end of radiation treatment.   Provided verbal post-RT guidance: Importance of keeping all follow-up appts, especially those with Nutrition and SLP. Importance of protecting treatment area from sun. Continuation of Sonafine application 2-3 times daily, application of antibiotic ointment to areas of raw skin; when supply of Sonafine exhausted transition to OTC lotion with vitamin E.  Explained my role as navigator will continue for several more months, encouraged him to call me with needs/concerns.    [name] RN, BSN, OCN Head & Neck Oncology Nurse Navigator [facility] Phone # [phone]  Fax # [phone]

## 2024-03-18 NOTE — Progress Notes (Unsigned)
 "  [facility]  ONCOLOGY CLINIC PROGRESS NOTE   Patient Care Team: Wendee Lynwood HERO, NP as PCP - General (Nurse Practitioner) Otho Redell Curry, MD as Referring Physician (Otolaryngology) Malmfelt, Delon CROME, RN as Oncology Nurse Navigator Izell Domino, MD as Consulting Physician (Radiation Oncology) Autumn Millman, MD as Consulting Physician (Oncology)  PATIENT NAME: Benjamin Choi   MR#: [id number] DOB: 18-Aug-1990  Date of visit: 03/18/2024   ASSESSMENT & PLAN:   Benjamin Choi is a 34 y.o. 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 Focal positive for LVI and intratumoral PNI.  Margins positive.   No problem-specific Assessment & Plan notes found for this encounter.   Neoplasm related pain Experiencing severe oral and surgical site pain, described as burning and severe, limiting oral intake. Oxycodone liquid was discontinued due to intolerable nausea. Topical lidocaine  and mouthwashes provide partial relief, but pain remains inadequately controlled. Open to alternative pain management options with less gastrointestinal side effects. - Prescribed tramadol  50 mg tablets, to be taken every 4-6 hours as needed for pain, sent to his pharmacy. - Advised to continue topical lidocaine  and mouthwashes for local pain control. - Discussed option of magic mouthwash, but noted cost and lack of insurance coverage. - Advised to notify if pain is not controlled or if he requires a refill.  Oral mucositis due to antineoplastic therapy Developed oral mucositis secondary to chemoradiation, with associated burning pain and visible mucosal changes. Managing with frequent mouthwashes and topical agents. - Advised to continue frequent  mouthwashes with baking soda and salt to promote healing and clear necrotic tissue. - Advised to continue topical lidocaine  for symptomatic relief. - Reinforced importance of maintaining oral hygiene and hydration.   I reviewed lab results and outside records for this visit and discussed relevant results with the patient. Diagnosis, plan of care and treatment options were also discussed in detail with the patient. Opportunity provided to ask questions and answers provided to his apparent satisfaction. Provided instructions to call our clinic with any problems, questions or concerns prior to return visit. I recommended to continue follow-up with PCP and sub-specialists. He verbalized understanding and agreed with the plan.   NCCN guidelines have been consulted in the planning of this patients care.  I spent a total of 42 minutes during this encounter with the patient including review of chart and various tests results, discussions about plan of care and coordination of care plan.   Millman Autumn, MD  03/18/2024 10:10 AM  [facility] - A DEPT [facility] [address] [place] [address] Dept: [phone] Dept Fax: [phone]    CHIEF COMPLAINT/ REASON FOR VISIT:   Invasive squamous cell carcinoma of the right lateral margin of tongue, status post partial glossectomy and right cervical lymph node dissection in September 2025.  Focal positive for LVI and intratumoral PNI.  Margins positive.   Current Treatment: Concurrent chemoradiation with weekly cisplatin  started from 01/27/2024.  INTERVAL HISTORY:    Discussed the use of AI scribe software for clinical note transcription with the patient, who gave verbal consent to proceed.  History of Present Illness  Benjamin Choi is a 34 year old male with malignant neoplasm of the anterior two-thirds of  the tongue, status post surgical resection, currently undergoing  concurrent chemoradiation, who presents for routine oncology follow-up during active treatment.  He experiences severe oral pain and burning localized to the area of prior surgical resection, describing the sensation as chewing on razor blades. The pain is exacerbated by oral intake and limits his ability to eat, necessitating intermittent reliance on tube feeds. He previously used oxycodone 5 mg liquid for pain control but discontinued due to intolerable nausea and gastrointestinal side effects. He currently uses topical lidocaine  and frequent mouthwashes, including baking soda and salt, with inadequate pain relief. He has used compounded mouthwash in the past, but cost and lack of insurance coverage limit its use. He is amenable to trying tramadol  and prefers analgesic options with minimal gastrointestinal side effects.  He closely monitors laboratory results, noting fluctuations in his white blood cell count between 3,900 and 6,000, with a current count of 4,300. He inquired about tumor markers for surveillance and understands that post-treatment monitoring will be by imaging and clinical assessment. He denies new masses, rash, fever, chills, or other systemic symptoms. He reports fatigue but remains able to manage daily activities and is motivated to complete therapy. His wife is involved in his care.     I have reviewed the past medical history, past surgical history, social history and family history with the patient and they are unchanged from previous note.  HISTORY OF PRESENT ILLNESS:   ONCOLOGY HISTORY:   He initially presented to his PCP in December of 2024 with a painful sore on his right lateral tongue. He was given triamcinolone  paste at that time to manage this without relief achieved.  He returned to his PCP in May of 2025 with c/o persistent pain from the lesion and was advised to try magic mouthwash which again did not provide him any relief. He was subsequently advised to follow-up  with his dentist who shaved down a sharp molar to help prevent further irritation against his tongue.   Given persistence of the painful tongue lesion, a biopsy was obtained on 10/27/23. Pathology showed findings consistent with keratinizing invasive squamous cell carcinoma with a minute focus of apparent LVI seen in a small vessel near the deep margin (and with tumor present in all margins of the submitted specimen).    Subsequently, the patient was referred to [facility] and was seen in consultation by Dr. Sallyann on 11/10/23 for further management. Oral examination performed at that time confirmed a 2 cm tender, ulcerated right lateral tongue mass. A laryngoscopy was also performed which showed no abnormal findings. Examination of the neck was also negative for cervical lymphadenopathy.    Imaging was advised for further evaluation and he accordingly presented for a soft tissue neck CT and chest CT at [facility] on 11/23/23 which collectively demonstrated: no definite evidence of invasive of local invasion within the soft tissues of the neck, with bilateral submental and jugular chain lymph nodes measure up to 7 mm in thickness, within normal limits. CT findings were also negative for metastatic disease within the chest.    Based on Dr. Elvis recommendations, he opted to proceed with a partial glossectomy with right cervical lymph node dissections on 12/03/23. 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 pT2, pN0, cM0  He did return to the ED on 12/15/23 with c/o bleeding from his mouth. Labs collected in the ED were unremarkable and the bleeding had stopped by the time that he was evaluated in the ED. No intervention was required  and he was advised to follow-up with ENT in an OP setting.    Swallowing issues, if any: none   Weight Changes: 5 lbs of weight loss (likely due to diet changes secondary to tongue pain)   Pain status: tongue is numb, was 10/10 pain prior to surgery. Recently bit his tongue and caused a wound in the Right lateral tongue.  Had dental evaluation and no intervention was needed.   Other symptoms: only able to eat soft foods due to initial pain from the lesion (eventually improved to eating chicken and steak); also reported that even drinking water would irritate his tongue    Tobacco history, if any: Former off/on smoker -- quit   ETOH abuse, if any: stopped drinking.   His case was discussed in ENT multidisciplinary tumor conference on 01/13/2024.  Because of high risk features including positive margin status, focal positivity for LVI and intratumoral PNI, recommendation made to proceed with adjuvant concurrent chemoradiation.  Plan to proceed with weekly cisplatin  during the course of radiation.  Started concurrent chemoradiation with weekly cisplatin  from 01/27/2024.  Oncology History  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)  01/03/2024 Initial Diagnosis   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)   01/13/2024 Cancer Staging   Staging form: Oral Cavity, AJCC 8th Edition - Pathologic: Stage II (pT2, pN0, cM0) - Signed by Autumn Millman, MD on 01/13/2024 Stage prefix: Initial diagnosis Residual tumor (R): R1 Lymph-vascular invasion (LVI): LVI present/identified, NOS Perineural invasion (PNI): Present Subclassification of lymphovascular invasion: Intratumoral   01/29/2024 -  Chemotherapy   Patient is on Treatment Plan : HEAD/NECK Cisplatin  (40) q7d         REVIEW OF SYSTEMS:   Review of Systems - Oncology  All other pertinent systems were reviewed with the patient and are negative.  ALLERGIES: He has no known  allergies.  MEDICATIONS:  Current Outpatient Medications  Medication Sig Dispense Refill   ciprofloxacin  (CIPRO ) 500 MG tablet Take 1 tablet (500 mg total) by mouth 2 (two) times daily. (Patient not taking: Reported on 02/25/2024) 20 tablet 0   dexamethasone  (DECADRON ) 4 MG tablet Take 2 tablets (8 mg) by mouth daily x 3 days starting the day after cisplatin  chemotherapy. Take with food. 30 tablet 1   fexofenadine  (ALLEGRA  ALLERGY) 180 MG tablet Take 1 tablet (180 mg total) by mouth daily. (Patient not taking: Reported on 02/25/2024) 90 tablet 1   fluticasone  (FLONASE ) 50 MCG/ACT nasal spray Place 2 sprays into both nostrils daily. 48 mL 1   gabapentin (NEURONTIN) 100 MG capsule Take 100 mg by mouth 3 (three) times daily. (Patient not taking: Reported on 02/25/2024)     hydrOXYzine  (ATARAX ) 10 MG tablet Take 1-2 tablets (10-20 mg total) by mouth 3 (three) times daily as needed for anxiety. 30 tablet 1   lidocaine  (XYLOCAINE ) 2 % solution Patient: Mix 1part 2% viscous lidocaine , 1part H20. Swish & spit 8 mL of diluted mixture up to 12 times a day PRN soreness. May swallow up to 4 times a day if throat is sore. 300 mL 3   lidocaine -prilocaine  (EMLA ) cream Apply to affected area once 30 g 3   magic mouthwash w/lidocaine  SOLN Swish 10mL in mouth  and spit, take PRN soreness.  Use up to 10 times daily. 1000 mL 3   Melatonin 10 MG TABS Take 10 mg by mouth at bedtime. (Patient not taking: Reported on 02/25/2024)     Nutritional Supplements (NUTREN 1.5) LIQD Give 7 cartons Nutren (1750 mL) 1.5 or equivalent split over 4 feedings via G-tube daily.  Flush with 60 mL free water before and after bolus feeding.  Give additional 200 mL water 4 times a day.  Provides 2625 cal, 119 g protein, 2617 mL water/100% estimated needs.     ondansetron  (ZOFRAN ) 8 MG tablet Take 1 tablet (8 mg total) by mouth every 8 (eight) hours as needed for nausea or vomiting. Start on the third day after cisplatin . 30 tablet 1    oxyCODONE (ROXICODONE) 5 MG/5ML solution Take 5 mg by mouth every 6 (six) hours as needed. (Patient not taking: Reported on 02/25/2024)     prochlorperazine  (COMPAZINE ) 10 MG tablet Take 1 tablet (10 mg total) by mouth every 6 (six) hours as needed (Nausea or vomiting). 30 tablet 1   traMADol  (ULTRAM ) 50 MG tablet Take 1 tablet (50 mg total) by mouth every 6 (six) hours as needed. 60 tablet 0   No current facility-administered medications for this visit.     VITALS:   Blood pressure 102/71, pulse (!) 102, temperature 98.7 F (37.1 C), temperature source Temporal, resp. rate 18, height 5' 9 (1.753 m), weight 150 lb (68 kg), SpO2 99%.  Wt Readings from Last 3 Encounters:  03/18/24 150 lb (68 kg)  03/11/24 149 lb (67.6 kg)  02/26/24 150 lb 8 oz (68.3 kg)    Body mass index is 22.15 kg/m.    Onc Performance Status - 03/18/24 0900       ECOG Perf Status   ECOG Perf Status Restricted in physically strenuous activity but ambulatory and able to carry out work of a light or sedentary nature, e.g., light house work, office work      KPS SCALE   KPS % SCORE Able to carry on normal activity, minor s/s of disease           PHYSICAL EXAM:   Physical Exam Constitutional:      General: He is not in acute distress.    Appearance: Normal appearance.  HENT:     Head: Normocephalic and atraumatic.     Mouth/Throat:     Comments: Radiation associated mucositis noted, mild. No evidence of thrush.  Eyes:     Conjunctiva/sclera: Conjunctivae normal.  Neck:     Comments:  Right-sided neck dissection scar has healed well. Cardiovascular:     Rate and Rhythm: Normal rate and regular rhythm.  Pulmonary:     Effort: Pulmonary effort is normal. No respiratory distress.  Abdominal:     General: There is no distension.  Skin:    Findings: Rash: resolved.  Neurological:     General: No focal deficit present.     Mental Status: He is alert and oriented to person, place, and time.   Psychiatric:        Mood and Affect: Mood normal.        Behavior: Behavior normal.       LABORATORY DATA:   I have reviewed the data as listed.  Results for orders placed or performed in visit on 03/18/24  Magnesium   Result Value Ref Range   Magnesium  2.1 1.7 - 2.4 mg/dL  CMP (Cancer Center only)  Result Value Ref Range  Sodium 137 135 - 145 mmol/L   Potassium 4.1 3.5 - 5.1 mmol/L   Chloride 100 98 - 111 mmol/L   CO2 28 22 - 32 mmol/L   Glucose, Bld 109 (H) 70 - 99 mg/dL   BUN 16 6 - 20 mg/dL   Creatinine 9.23 9.38 - 1.24 mg/dL   Calcium 9.5 8.9 - 89.6 mg/dL   Total Protein 7.1 6.5 - 8.1 g/dL   Albumin 4.3 3.5 - 5.0 g/dL   AST 22 15 - 41 U/L   ALT 29 0 - 44 U/L   Alkaline Phosphatase 67 38 - 126 U/L   Total Bilirubin 0.5 0.0 - 1.2 mg/dL   GFR, Estimated >39 >39 mL/min   Anion gap 10 5 - 15  CBC with Differential (Cancer Center Only)  Result Value Ref Range   WBC Count 3.5 (L) 4.0 - 10.5 K/uL   RBC 4.21 (L) 4.22 - 5.81 MIL/uL   Hemoglobin 13.4 13.0 - 17.0 g/dL   HCT 62.8 (L) 60.9 - 47.9 %   MCV 88.1 80.0 - 100.0 fL   MCH 31.8 26.0 - 34.0 pg   MCHC 36.1 (H) 30.0 - 36.0 g/dL   RDW 86.6 88.4 - 84.4 %   Platelet Count 216 150 - 400 K/uL   nRBC 0.0 0.0 - 0.2 %   Neutrophils Relative % 66 %   Neutro Abs 2.3 1.7 - 7.7 K/uL   Lymphocytes Relative 19 %   Lymphs Abs 0.7 0.7 - 4.0 K/uL   Monocytes Relative 13 %   Monocytes Absolute 0.5 0.1 - 1.0 K/uL   Eosinophils Relative 1 %   Eosinophils Absolute 0.0 0.0 - 0.5 K/uL   Basophils Relative 0 %   Basophils Absolute 0.0 0.0 - 0.1 K/uL   Immature Granulocytes 1 %   Abs Immature Granulocytes 0.02 0.00 - 0.07 K/uL       RADIOGRAPHIC STUDIES:  I have personally reviewed the radiological images as listed and agree with the findings in the report.  IR GASTROSTOMY TUBE MOD SED INDICATION: treatment for head and neck cancer trouble swallowing  EXAM: PERCUTANEOUS GASTROSTOMY TUBE PLACEMENT  COMPARISON:  CT AP,  02/02/2024  MEDICATIONS: Ancef  2 gm IV; Antibiotics were administered within 1 hour of the procedure.  0.5 mg glucagon  IV  CONTRAST:  15 mL of Isovue 300 administered into the gastric lumen.  ANESTHESIA/SEDATION: Moderate (conscious) sedation was employed during this procedure. A total of Versed  3 mg and Fentanyl  150 mcg was administered intravenously.  Moderate Sedation Time: 14 minutes. The patient's level of consciousness and vital signs were monitored continuously by radiology nursing throughout the procedure under my direct supervision.  FLUOROSCOPY: Radiation Exposure Index and estimated peak skin dose (PSD);  Reference air kerma (RAK), 2 mGy.  COMPLICATIONS: None immediate.  PROCEDURE: Informed written consent was obtained from the patient and/or patient's representative following explanation of the procedure, risks, benefits and alternatives. A time out was performed prior to the initiation of the procedure. Maximal barrier sterile technique utilized including caps, mask, sterile gowns, sterile gloves, large sterile drape, hand hygiene and sterile prep.  The LEFT costal margin and barium opacified transverse colon were identified and avoided. Air was injected into the stomach for insufflation and visualization under fluoroscopy. Under sterile conditions and local anesthesia, 2 T tacks were utilized to pexy the anterior aspect of the stomach against the ventral abdominal wall. Contrast injection confirmed appropriate positioning of each of the T tacks. An incision was made between the  T tacks and a 17 gauge trocar needle was utilized to access the stomach. Needle position was confirmed within the stomach with aspiration of air and injection of a small amount of contrast. A stiff guidewire was advanced into the gastric lumen and under intermittent fluoroscopic guidance, the access needle was exchanged for a telescoping peel-away sheath, ultimately allowing  placement of a 14 Fr balloon retention gastrostomy tube. The retention balloon was insufflated with a mixture of dilute saline and contrast and pulled taut against the anterior wall of the stomach. The external disc was cinched. Contrast injection confirms positioning within the stomach. Several spot radiographic images were obtained in various obliquities for documentation. The patient tolerated procedure well without immediate post procedural complication.  FINDINGS: After successful fluoroscopic guided placement, the gastrostomy tube is appropriately positioned with internal retention balloon against the ventral aspect of the gastric lumen.  IMPRESSION: Successful fluoroscopic insertion of a 14 Fr balloon retention gastrostomy tube.  The gastrostomy may be used immediately for medication administration and in 4 hrs for the initiation of feeds.  RECOMMENDATIONS: The patient will return to Vascular Interventional Radiology (VIR) for routine feeding tube evaluation and exchange in 6 months.  Thom Hall, MD  [facility]  [facility]  Electronically Signed   By: Thom Hall M.D.   On: 02/12/2024 17:27    CODE STATUS:  Code Status History     Date Active Date Inactive Code Status Order ID Comments User Context   01/26/2024 1510 01/27/2024 0527 Full Code [id number]  Philip Cornet, MD HOV    Questions for Most Recent Historical Code Status (Order [id number])     Question Answer   By: Consent: discussion documented in EHR            No orders of the defined types were placed in this encounter.    Future Appointments  Date Time Provider Department Center  03/21/2024 10:00 AM [place], Redbird Smith L, PT [facility] None  03/30/2024 10:00 AM Breedlove Blue, Nye L, PT [facility] None  04/06/2024 10:00 AM Bre[place]love Blue, Blaire L, PT [facility] None  08/12/2024  3:30 PM [facility] [place]      This document was  completed utilizing speech recognition software. Grammatical errors, random word insertions, pronoun errors, and incomplete sentences are an occasional consequence of this system due to software limitations, ambient noise, and hardware issues. Any formal questions or concerns about the content, text or information contained within the body of this dictation should be directly addressed to the provider for clarification.   "

## 2024-03-18 NOTE — Progress Notes (Signed)
 Patient here for port flush/labs, Rad Onc TX. And infusion.  Patient  states he will do the Rad. Onc. TX., but wished to discontinue his Infusion treatment.  Secure chatted Dr. Autumn Lesches, RN and Elenor, RN treatment nurse in infusion.  Port de accessed per Dr. Autumn request.

## 2024-03-21 ENCOUNTER — Ambulatory Visit: Payer: Self-pay | Admitting: Physical Therapy

## 2024-03-21 ENCOUNTER — Encounter: Payer: Self-pay | Admitting: Oncology

## 2024-03-21 NOTE — Radiation Completion Notes (Signed)
 Patient Name: Benjamin Choi, Benjamin Choi MRN: [id number] Date of Birth: Jun 03, 1990 Referring Physician: REDELL PAX, M.D. Date of Service: 2024-03-21 Radiation Oncologist: Lauraine Golden, M.D. [facility]                             RADIATION ONCOLOGY END OF TREATMENT NOTE     Diagnosis: C02.3 Malignant neoplasm of anterior two-thirds of tongue, part unspecified Staging on 2024-01-13: 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) T=pT2, N=pN0, M=cM0 Intent: Curative     ==========DELIVERED PLANS==========  First Treatment Date: 2024-01-27 Last Treatment Date: 2024-03-18   Plan Name: [id number] Site: Tongue Technique: IMRT Mode: Photon Dose Per Fraction: 2 Gy Prescribed Dose (Delivered / Prescribed): 66 Gy / 66 Gy Prescribed Fxs (Delivered / Prescribed): 33 / 33     ==========ON TREATMENT VISIT DATES========== 2024-02-01, 2024-02-08, 2024-02-15, 2024-02-22, 2024-02-29, 2024-03-09, 2024-03-14, 2024-03-18     ==========UPCOMING VISITS========== 04/13/2024 [facility] ON[facility]OG[facility] EST PT 15 Pasam, Avinash, MD  04/13/2024 [facility]  04/06/2024 [facility] REH AT [facility] [address] [place], [place]  04/05/2024 [facility] ON[facility] FOLLOW UP 20 Golden Lauraine, MD  03/30/2024 [facility] REH AT [facility] [place], [place] L, PT        ==========APPENDIX - ON TREATMENT VISIT NOTES==========   See weekly On Treatment Notes in Epic for details in the Media tab (listed as Progress notes on the On Treatment Visit Dates listed above).

## 2024-03-21 NOTE — Assessment & Plan Note (Signed)
 Please review oncology history for additional details and timeline of events.  He underwent partial glossectomy with right cervical lymph node dissections on 12/03/23. 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 pT2, pN0, cM0.  His case was discussed in ENT multidisciplinary tumor conference on 01/13/2024.  Because of high risk features including positive margin status, focal positivity for LVI and intratumoral PNI, recommendation made to proceed with adjuvant concurrent chemoradiation.    Previously I discussed recent developments, findings, pathology reports and consensus opinion from ENT tumor conference with the patient and his wife who was accompanying.  They verbalized understanding.  He is in agreement with the plan to proceed with concurrent chemoradiation, given positive margins.  We have discussed about role of cisplatin  being a radiosensitizer in the treatment of head and neck cancer.  We have discussed about the curative intent of chemoradiation for this patient.  Patient is willing to proceed with weekly cisplatin .  Started radiation treatments from 01/27/2024.  Started chemotherapy with cisplatin  from 01/29/2024.   Completed 6 cycles of cisplatin  as of 03/11/2024.  He is scheduled to complete radiation treatments later today, 03/18/2024.  He would like to defer close #7 and we respect his wishes.    He did undergo feeding tube placement on 02/12/2024.  Relying on tube feeds now mostly.  Previously referrals sent for nutritionist, speech-language pathology, physical therapy for supportive care.  I will plan to see him in 4 weeks for routine follow-up with repeat labs.

## 2024-03-23 NOTE — Progress Notes (Addendum)
 Mr. Pavon presents today in the clinic for a follow up. 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  Pain issues, if any: Patient having 5 out of 10 tongue pain Using a feeding tube?: None Weight changes, if any: Weight has stayed stable throughout. Swallowing issues, if any: None Smoking or chewing tobacco? None Using fluoride toothpaste daily? None Last ENT visit was on: October 2025 Other notable issues, if any: None BP 127/88 (BP Location: Right Arm, Patient Position: Sitting)   Pulse (!) 103   Temp 98.8 F (37.1 C) (Oral)   Resp 18   Wt 146 lb 3.2 oz (66.3 kg)   SpO2 99%   BMI 21.59 kg/m    Wt Readings from Last 3 Encounters:  04/05/24 146 lb 3.2 oz (66.3 kg)  03/18/24 150 lb (68 kg)  03/11/24 149 lb (67.6 kg)

## 2024-03-24 ENCOUNTER — Telehealth: Payer: Self-pay

## 2024-03-24 NOTE — Telephone Encounter (Signed)
 LVM for pt regarding his Disability forms being completed and faxed, Left call back number.

## 2024-03-25 ENCOUNTER — Telehealth: Payer: Self-pay

## 2024-03-25 NOTE — Telephone Encounter (Signed)
 Spoke with pt spouse in regards to his FMLA form. Pt spouse stated that he needs a Dr. Note to return to work. I let her know that he would have to speak with his providers nurse on this matter. Mrs. Kissel verbalized understanding.

## 2024-03-29 ENCOUNTER — Inpatient Hospital Stay: Payer: Self-pay | Admitting: Nutrition

## 2024-03-29 NOTE — Progress Notes (Signed)
 Received email from nurse navigator requesting RD's help with obtaining tube feeding supply for patient.  Patient attempted to reorder Nutren 1.5 and was told that more could not be delivered until a doctor faxes them a note saying it is okay to do so.  Reviewed tube feeding order.  Current order has refills until December, 2026.  Contacted [employer].  Patient's tube feeding order required a pre authorization which was not completed prior to original delivery.  [employer] could not explain why this was not done prior to sending previous shipment.  I requested that company send an emergency supply to patient so that he does not run out of formula and note was marked critical and placed in his file.  [employer] stated they would call patient today and communicate situation.  I requested a return call as well.  **Disclaimer: This note was dictated with voice recognition software. Similar sounding words can inadvertently be transcribed and this note may contain transcription errors which may not have been corrected upon publication of note.**

## 2024-03-30 ENCOUNTER — Encounter: Payer: Self-pay | Admitting: Physical Therapy

## 2024-03-30 ENCOUNTER — Ambulatory Visit: Payer: Self-pay | Admitting: Physical Therapy

## 2024-03-30 ENCOUNTER — Telehealth: Payer: Self-pay | Admitting: Nutrition

## 2024-03-30 DIAGNOSIS — M25511 Pain in right shoulder: Secondary | ICD-10-CM

## 2024-03-30 DIAGNOSIS — R293 Abnormal posture: Secondary | ICD-10-CM

## 2024-03-30 DIAGNOSIS — C023 Malignant neoplasm of anterior two-thirds of tongue, part unspecified: Secondary | ICD-10-CM

## 2024-03-30 DIAGNOSIS — M25611 Stiffness of right shoulder, not elsewhere classified: Secondary | ICD-10-CM

## 2024-03-30 NOTE — Telephone Encounter (Signed)
-----   Message from NEFF, [facility] sent at 03/30/2024 12:40 PM EST ----- Regarding: RE: TF follow up Whenever he will come in. I would prefer in the next few weeks! ----- Message ----- From: Clement Renshaw Sent: 03/30/2024  11:08 AM EST To: Heron LITTIE Prost, RD Subject: RE: TF follow up                               Sure, when should I have him follow up? ----- Message ----- From: Prost Heron LITTIE, RD Sent: 03/29/2024   9:46 AM EST To: Renshaw Clement Subject: TF follow up                                   Damaris, Can you schedule a nutrition follow up with patient?  Thanks, [employer]

## 2024-03-30 NOTE — Therapy (Signed)
 " OUTPATIENT PHYSICAL THERAPY HEAD AND NECK BASELINE TREATMENT   Patient Name: Benjamin Choi MRN: [id number] DOB:1990/08/19, 34 y.o., male Today's Date: 03/30/2024  END OF SESSION:  PT End of Session - 03/30/24 1004     Visit Number 6    Number of Visits 10    Date for Recertification  04/27/24    PT Start Time 1003    PT Stop Time 1050    PT Time Calculation (min) 47 min    Activity Tolerance Patient tolerated treatment well    Behavior During Therapy 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) 01/03/2024   Anxiety 11/02/2023   Tongue cancer (HCC) 11/02/2023   Radicular pain in left arm 04/22/2023   Allergic rhinitis 04/22/2023   Tongue lesion 02/16/2023   Puncture wound 12/15/2022   Screening for lipid disorders 06/24/2022   Preventative health care 06/24/2022   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   Depression     PCP: Lynwood Crandall, NP  REFERRING PROVIDER: Izell Domino, MD  REFERRING DIAG: C02.3 (ICD-10-CM) - 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)  THERAPY DIAG:  Stiffness of right shoulder, not elsewhere classified - Plan: PT plan of care cert/re-cert  Acute pain of right shoulder - Plan: PT plan of care cert/re-cert  Abnormal posture - Plan: PT plan of care cert/re-cert  Malignant neoplasm of anterior two-thirds of tongue, part unspecified (HCC) - Plan: PT plan of care cert/re-cert  Rationale for Evaluation and Treatment: Rehabilitation  ONSET DATE: 12/03/23  SUBJECTIVE:     SUBJECTIVE STATEMENT: I am feeling a little better each day.   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 He presented to his PCP in Dec 2024 with a painful sore on his right lateral tongue. He was given triamcinolone  paste without relief and returned May 2025 with persistent pain from the lesion and tried magic mouthwash again without relief. He saw a dentist who shaved down a sharp molar to prevent irritation to his tongue. Given persistent pain to the tongue lesion a biopsy was done 10/27/23 which showed findings consistent with keratinizing invasive SCC. He was referred to [facility]NT. 11/10/23 Consult with Dr. Sallyann. Exam at that time confirmed a 2 cm tender ulcerated right lateral tongue mass. Laryngoscopy negative and neck negative for cervical lymphadenopathy. 11/22/13 CT neck/chest at [facility] which collectively demonstrated: no definite evidence of invasive of local invasion within the soft tissues of the neck, with bilateral submental and jugular chain lymph nodes measure up to 7 mm in thickness, within normal limits. CT findings were also negative for metastatic disease within the chest.12/03/23 he opted to proceed with a partial glossectomy with right cervical lymph node dissections on 12/03/23. Pathology from the procedure showed: tumor the size of 1.3 cm; histology of moderately differentiated invasive squamous cell carcinoma of the tongue (keratinizing) invading a depth of 0.8 cm; positive for focal LVI and intratumoral PNI; Margin positive, nodal status of 29/29 excised lymph nodes negative for carcinoma. He will receive 33 fractions of  radiation to his right tongue and right neck with weekly chemotherapy which started on 01/27/24 and will complete 03/17/24.  PATIENT GOALS:   to be educated about the signs and symptoms of lymphedema and learn post op HEP.   PAIN:  Are you having pain? Yes: NPRS scale: 6/10 Pain location: mouth Pain description: burning Aggravating factors: food Relieving factors: not eating, salt water rinses  PRECAUTIONS:  Active CA  RED FLAGS: None   WEIGHT BEARING RESTRICTIONS: No  FALLS:  Has patient fallen in last 6 months? No Does the patient have a fear of falling that limits activity? No Is the patient reluctant to leave the house due to a fear of falling?No  LIVING ENVIRONMENT: Patient lives with: wife and kids aged 11 and 7 Lives in: House/apartment Has following equipment at home: None  OCCUPATION: on medical leave- retail banker   LEISURE: outside in yard daily, rides bikes (dirt bikes, mountain bikes), plays ball with kids  PRIOR LEVEL OF FUNCTION: Independent   OBJECTIVE: Note: Objective measures were completed at Evaluation unless otherwise noted.  COGNITION: Overall cognitive status: Within functional limits for tasks assessed                  POSTURE:  Forward head and rounded shoulders posture  30 SEC SIT TO STAND: 20 reps in 30 sec without use of UEs which is  Poor for patient's age. Pt reports he felt he could have completed more.   SHOULDER AROM:   Impaired  R shoulder limited post neck dissection surgery - will measure at next session   CERVICAL AROM:   Percent limited 03/30/24  Flexion [facility]  Extension WFL 25% limited  Right lateral flexion 25% limited 25% limited  Left lateral flexion 35% limited 50% limited  Right rotation [facility]  Left rotation WFL 25% limited    (Blank rows=not tested)  UPPER EXTREMITY AROM/PROM:  A/PROM RIGHT   eval  RIGHT 02/24/24 RIGHT 03/08/24 RIGHT  03/30/24  Shoulder extension 68 62    Shoulder flexion 128 130 141 149  Shoulder abduction 75 65 109 111  Shoulder internal rotation Unable  Unable    Shoulder external rotation unable Unable      (Blank rows = not tested)  A/PROM LEFT   eval  Shoulder extension 70  Shoulder flexion 144  Shoulder abduction 157  Shoulder internal rotation 65  Shoulder external rotation 85    (Blank rows = not tested)  Neck Disability Index score: 19 / 50 = 38.0 %  TREATMENT  PERFORMED: 03/31/23: Therapeutic Exercise: Ball up wall x 10 reps in direction of flexion with v/c to engage scapular muscles throughout to prevent hiking Pulleys x 2 min in direction of flexion and 1 minin direction of abduction with v/c to move through full ROM - stopped early due to fatigue Therapeutic Activity: Supine over full foam roller: Supine scapular series with green band x 10 reps each with pt returning therapist demo: narrow and wide grip flexion, horizontal abduction, ER, and diagonals with v/c to keep scapular muscles engaged Alternating flexion x 10 reps, bilateral scaption x 10 reps, snow angels x 10 reps Dual Cable Machine: Scapular retraction x 15 reps x 13 lbs with focus on engaging scapular muscles Shoulder extension with arms at 6, 7 lbs x 15 reps Shoulder ER arm at 7 on R x 15 reps with 3 lbs  Push/pull with pt returning therapist demo with 7 lbs x 10 reps bilaterally  03/16/23: Therapeutic  Exercise: Ball up wall x 10 reps in direction of flexion with v/c to engage scapular muscles throughout to prevent hiking Pulleys x 2 min in direction of flexion and 1 minin direction of abduction with v/c to move through full ROM - stopped early due to fatigue Therapeutic Activity: Dual Cable Machine: Scapular retraction x 15 reps x 13 lbs with focus on engaging scapular muscles Shoulder extension with arms at 6, 7 lbs x 11 reps Shoulder ER arm at 7 on R x 10 reps with 3 lbs  Push/pull with pt returning therapist demo with 7 lbs x 10 reps bilaterally  03/08/24: Measured bilateral shoulder ROM - a lot of improvement in ROM compared to last session Therapeutic Exercise: Ball up wall x 10 reps in direction of flexion with v/c to engage scapular muscles throughout to prevent hiking Pulleys x 2 min in direction of flexion with v/c throughout for form and to keep from hiking and 1 min in direction of abduction but stopped early due to inability to control shoulder hiking despite  various degrees of abduction and scaption Green theraband hooked in door with knot pulling out and down x 10  Therapeutic Activity: Supine over full foam roller:  Supine scapular series with red band x 10 reps each with pt returning therapist demo: narrow and wide grip flexion, horizontal abduction, ER, and diagonals with v/c to keep scapular muscles engaged  Bilateral scaption x 10 reps Dual Cable Machine: Scapular retraction x 18 reps x 10 lbs with focus on engaging scapular muscles Shoulder extension with arms at 6, 7 lbs x 13 reps Shoulder ER arm at 7 on R x 10 reps with 3 lbs  Push/pull with pt returning therapist demo with 7 lbs x 10 reps bilaterally  02/24/24: Measured bilateral shoulder ROM Therapeutic Exercise: Ball up wall x 10 reps in direction of flexion with v/c to engage scapular muscles throughout to prevent hiking Pulleys x 2 min in direction of flexion with v/c throughout for form and to keep from hiking and 1 min in direction of abduction but stopped early due to inability to control shoulder hiking despite various degrees of abduction and scaption Therapeutic Activity: Supine over full foam roller:  Supine scapular series with yellow band x 10 reps each with pt returning therapist demo: narrow and wide grip flexion, horizontal abduction, ER, and diagonals with v/c to keep scapular muscles engaged  Alternating flexion x 10 reps, bilateral scaption x 10 reps, horizontal abduction x 10 reps, snow angels x 10 reps  2 min stretch in horizontal abduction with therapist assisting by helping keep arm abducted so pt could feel stretch  Dual Cable Machine: Scapular retraction x 10 reps x 7 lbs with focus on engaging scapular muscles Shoulder extension with arms at 6, 3 lbs x 10 reps Shoulder ER on R x 10 reps with 3 lbs   02/17/24: Measured bialteral shoulder ROM  Therapeutic Exercise: Pulleys x 2 min in direction of flexion and 2 min in direction of abduction with v/c to  keep shoulder relaxed  Therapeutic Activity: Ball up wall x 10 reps in direction of flexion with v/c and t/c to keep scapular muscle engaged throughout and then 5 reps in to R shoulder abduction - stopped due to pain in joint L sidelying: R shoulder abduction with therapist manual assist to keep shoulder from going forward with v/c to keep scapular muscles engaged x 10 with the last few pt able to complete without therapist assist with good  control, then R shoulder flexion x 10 reps with initial manual assist but then pt able to complete without assist with good control, ER with towel under elbow x 5 reps with 2 lbs and then 5 with 1 lb (decreased due to discomfort at feeding tube) Seated: scapular retraction with red band x 10 with good activation of scapular muscles Shoulder shrugs with 2 lbs with initial t/c from therapist for shoulder positioning but then pt able to control shoulder x 10 reps  PATIENT EDUCATION:  Education details: how to control scapular muscles to avoid compensation to decrease pain, new HEP Person educated: Patient Education method: Explanation, Demonstration, Handout Education comprehension: Patient verbalized understanding and returned demonstration  HOME EXERCISE PROGRAM: Neck ROM exercises   Access Code: [email or web address] URL: [email or web address] Date: 02/17/2024 Prepared by: Florina Lanis Carbon  Exercises - Sidelying Shoulder External Rotation (Mirrored)  - 1 x daily - 7 x weekly - 1 sets - 10 reps - Sidelying Shoulder Abduction Palm Forward  - 1 x daily - 7 x weekly - 1 sets - 10 reps - Sidelying Shoulder Horizontal Abduction  - 1 x daily - 7 x weekly - 1 sets - 10 reps - Sidelying Shoulder Flexion 15 Degrees (Mirrored)  - 1 x daily - 7 x weekly - 1 sets - 10 reps - Standing Shoulder Shrug and Retraction with Resistance  - 1 x daily - 7 x weekly - 1 sets - 10 reps - Standing Shoulder Row with Anchored Resistance  - 1 x daily - 7 x weekly - 1 sets -  10 reps  Supine Scapular Series  ASSESSMENT:  CLINICAL IMPRESSION: Assessed pt's progress towards goals in therapy. He is progressing towards his ROM goal. He is more aware of proper GH rhythm and posture during overhead activity. His pain is somewhat improved since beginning therapy. He has increased tightness in his neck with decreased cervical ROM. He would benefit from additional skilled PT services to improve R shoulder ROM and strength, improve cervical ROM and decrease R shoulder pain.   Pt will benefit from skilled therapeutic intervention to improve on the following deficits: Decreased knowledge of precautions and postural dysfunction. Other deficits: decreased ROM, decreased strength, increased fascial restrictions, impaired UE functional use, postural dysfunction, and pain  PT treatment/interventions: ADL/self-care home management, pt/family education, therapeutic exercise. Other interventions 97164- PT Re-evaluation, 97110-Therapeutic exercises, 97530- Therapeutic activity, [id number]- Neuromuscular re-education, 97535- Self Care, [id number]- Manual therapy, 97760- Orthotic Initial, [phone]- Orthotic/Prosthetic subsequent, and Patient/Family education  REHAB POTENTIAL: Good  CLINICAL DECISION MAKING: Evolving/moderate complexity  EVALUATION COMPLEXITY: Moderate   GOALS: Goals reviewed with patient? YES  LONG TERM GOALS: (STG=LTG)   Name Target Date  Goal status  1 Patient will be able to verbalize understanding of a home exercise program for cervical range of motion, posture, and walking.   Baseline:  No knowledge 01/28/2024 Achieved at eval  2 Patient will be able to verbalize understanding of proper sitting and standing posture. Baseline:  No knowledge 01/28/2024 Achieved at eval  3 Patient will be able to verbalize understanding of lymphedema risk and availability of treatment for this condition Baseline:  No knowledge 01/28/2024 Achieved at eval  4 Pt will demonstrate a return to  full cervical ROM and function post operatively compared to baselines and not demonstrate any signs or symptoms of lymphedema.  Baseline: See objective measurements taken today. 03/30/24 ONGOING - 03/30/24- more limited than baseline currently  5 Pt will demonstrate 150 degrees of L shoulder flexion to  allow him to reach overhead.  03/30/24 ONGOING 03/30/24  6 Pt will demonstrate 150 degrees of L shoulder abduction to allow him to reach out to the side. 03/30/24 ONGOING 03/30/24   7 Pt will report a 50% improvement in R shoulder pain to allow improved comfort and function.   03/30/24 03/30/24- 25% improved  8 Pt will be independent in a home exercise program for continued stretching and strengtheing.   03/30/24 03/31/23- ongoing    PLAN:  PT FREQUENCY/DURATION: 1x/wk for 4 weeks   PLAN FOR NEXT SESSION: cont instruction on proper gleno humeral rhythm, pulleys, ball, side lying exercises, scap mobs, dual cable machine, scar massage    Physical Therapy Information for During and After Head/Neck Cancer Treatment: Lymphedema is a swelling condition that you may be at risk for in your neck and/or face if you have radiation treatment to the area and/or if you have surgery that includes removing lymph nodes.  There is treatment available for this condition and it is not life-threatening.  Contact your physician or physical therapist with concerns. An excellent resource for those seeking information on lymphedema is the National Lymphedema Network's website.  It can be accessed at www.lymphnet.org If you notice swelling in your neck or face at any time following surgery (even if it is many years from now), please contact your doctor or physical therapist to discuss this.  Lymphedema can be treated at any time but it is easier for you if it is treated early on. If you have had surgery to your neck, please check with your surgeon about how soon to start doing neck range of motion exercises.  If you are not  having surgery, I encourage you to start doing neck range of motion exercises today and continue these while undergoing treatment, UNLESS you have irritation of your skin or soft tissue that is aggravated by doing them.  These exercises are intended to help you prevent loss of range of motion and/or to gain range of motion in your neck (which can be limited by tightening effects of radiation), and NOT to aggravate these tissues if they develop sensitivities from treatment. Neck range of motion exercises should be done to the point of feeling a GENTLE, TOLERABLE stretch only.  You are encouraged to start a walking or other exercise program tomorrow and continue this as much as you are able through and after treatment.  Please feel free to call me with any questions. Florina Lanis Carbon, PT, CLT Physical Therapist and Certified Lymphedema Therapist [facility] [address], [place], [place] [address] [phone] [email or web address]  WALKING  Walking is a great form of exercise to increase your strength, endurance and overall fitness.  A walking program can help you start slowly and gradually build endurance as you go.  Everyone's ability is different, so each person's starting point will be different.  You do not have to follow them exactly.  The are just samples. You should simply find out what's right for you and stick to that program.   In the beginning, you'll start off walking 2-3 times a day for short distances.  As you get stronger, you'll be walking further at just 1-2 times per day.  A. You Can Walk For A Certain Length Of Time Each Day    Walk 5 minutes 3 times per day.  Increase 2 minutes every 2 days (3 times per day).  Work up to 25-30 minutes (1-2 times per day).   Example:  Day 1-2 5 minutes 3 times per day   Day 7-8 12 minutes 2-3 times per day   Day 13-14 25 minutes 1-2 times per day  B. You Can Walk For a Certain Distance  Each Day     Distance can be substituted for time.    Example:   3 trips to mailbox (at road)   3 trips to corner of block   3 trips around the block  C. Go to local high school and use the track.    Walk for distance ____ around track  Or time ____ minutes  D. Walk ____ Jog ____ Run ___   Why exercise?  So many benefits! Here are SOME of them: Heart health, including raising your good cholesterol level and reducing heart rate and blood pressure Lung health, including improved lung capacity It burns fats, and most of us  can stand to be leaner, whether or not we are overweight. It increases the body's natural painkillers and mood elevators, so makes you feel better. Not only makes you feel better, but look better too Improves sleep Takes a bite out of stress May decrease your risk of many types of cancer If you are currently undergoing cancer treatment, exercise may improve your ability to tolerate treatments including chemotherapy. For everybody, it can improve your energy level. Those with cancer-related fatigue report a 40-50% reduction in this symptom when exercising regularly. If you are a survivor of breast, colon, or prostate cancer, it may decrease your risk of a recurrence. (This may hold for other cancers too, but so far we have data just for these three types.)  How to exercise: Get your doctor's okay. Pick something you enjoy doing, like walking, Zumba, biking, swimming, or whatever. Start at low intensity and time, then gradually increase.  (See walking program handout.) Set a goal to achieve over time.  The American Cancer Society, American Heart Association, and U.S. Dept. of Health and Human Services recommend 150 minutes of moderate exercise, 75 minutes of vigorous exercise, or a combination of both per week. This should be done in episodes at least 10 minutes long, spread throughout the week.  Need help being motivated? Pick something you enjoy doing, because  you'll be more inclined to stick with that activity than something that feels like a chore. Do it with a friend so that you are accountable to each other. Schedule it into your day. Place it on your calendar and keep that appointment just like you do any appointment that you make. Join an exercise group that meets at a specific time.  That way, you have to show up on time, and that makes it harder to procrastinate about doing your workout.  It also keeps you accountable--people begin to expect you to be there. Join a gym where you feel comfortable and not intimidated, at the right cost. Sign up for something that you'll need to be in shape for on a specific date, like a 1K or a 5K to walk or run, a 20 or 30 mile bike ride, a mud run or something like that. If the date is looming, you know you'll need to train to be ready for it.  An added benefit is that many of these are fundraisers for good causes. If you've already paid for a gym membership, group exercise class or event, you might as well work out, so you haven't wasted your money!    [facility], PT 03/30/2024, 11:56 AM                       "

## 2024-03-30 NOTE — Telephone Encounter (Signed)
 Contacted pt to schedule an appt. Unable to reach via phone, voicemail was left.

## 2024-04-05 ENCOUNTER — Inpatient Hospital Stay: Admitting: Licensed Clinical Social Worker

## 2024-04-05 ENCOUNTER — Encounter: Payer: Self-pay | Admitting: Oncology

## 2024-04-05 ENCOUNTER — Other Ambulatory Visit: Payer: Self-pay

## 2024-04-05 ENCOUNTER — Ambulatory Visit
Admission: RE | Admit: 2024-04-05 | Discharge: 2024-04-05 | Disposition: A | Payer: Self-pay | Source: Ambulatory Visit | Attending: Radiation Oncology | Admitting: Radiation Oncology

## 2024-04-05 ENCOUNTER — Inpatient Hospital Stay: Admitting: Nutrition

## 2024-04-05 VITALS — BP 127/88 | HR 103 | Temp 98.8°F | Resp 18 | Wt 146.2 lb

## 2024-04-05 DIAGNOSIS — K1230 Oral mucositis (ulcerative), unspecified: Secondary | ICD-10-CM | POA: Insufficient documentation

## 2024-04-05 DIAGNOSIS — Z7952 Long term (current) use of systemic steroids: Secondary | ICD-10-CM | POA: Insufficient documentation

## 2024-04-05 DIAGNOSIS — C023 Malignant neoplasm of anterior two-thirds of tongue, part unspecified: Secondary | ICD-10-CM | POA: Insufficient documentation

## 2024-04-05 DIAGNOSIS — Z79899 Other long term (current) drug therapy: Secondary | ICD-10-CM | POA: Insufficient documentation

## 2024-04-05 DIAGNOSIS — C029 Malignant neoplasm of tongue, unspecified: Secondary | ICD-10-CM

## 2024-04-05 DIAGNOSIS — Z923 Personal history of irradiation: Secondary | ICD-10-CM | POA: Insufficient documentation

## 2024-04-05 NOTE — Progress Notes (Signed)
 Nutrition follow-up completed with patient and wife.  Patient completed concurrent chemoradiation therapy on January 9 for [facility] of the right two thirds of tongue.  He is followed by Dr. Izell and Dr. Autumn.  G-tube: 14 French placed December 5 DME: [employer]  Weight:  146 pounds 3.2 ounces January 27 149 pounds January 2 157 pounds December 5  Labs on January 9 include glucose 109.  Medications reviewed  Estimated nutrition needs:  2365-2580 cal, 93-115 g protein, greater than 2 L fluid.  Tube feeding goal: 7 cartons Nutren 1.5 provides 2625 cal and 119 g protein.  Patient receives a total of 2617 mL free water with tube feeding and free water flushes providing approximately leave 100% estimated needs.  Patient reports he continues to tolerate tube feeding without difficulty.  He has had difficulty getting refills through home health.  States he cut back on the number of cartons of Nutren 1.5 so he would not run out.  This could be the reason for 3 pound weight loss.  He has constipation occasionally which he manages with MiraLAX.  Noted patchy oral mucositis.  Patient is drinking water by mouth but has not tolerated other fluids yet.  He is anxious to have his feeding tube and port removed.  Nutrition diagnosis: Inadequate oral intake, ongoing.  Intervention: Continue 7 cartons Nutren 1.5 or equivalent via G-tube with free water flushes to provide 100% estimated needs. Begin adding other liquids and soft solids as tolerated.  Suggested oral nutrition supplements as tolerated. Provided 2 cases Nutren 1.5. Will continue to work with home health to supply him with adequate tube feeding formula and supplies.    Monitoring, evaluation, goals: Patient will tolerate adequate calories and protein to minimize weight loss and promote healing.  Next visit: Thursday, February 12 by telephone.  **Disclaimer: This note was dictated with voice recognition software. Similar sounding words can  inadvertently be transcribed and this note may contain transcription errors which may not have been corrected upon publication of note.**

## 2024-04-05 NOTE — Progress Notes (Signed)
 Oncology Nurse Navigator Documentation    I met briefly with Mr. Lambertson and his wife before his follow up with Dr. Izell today. He reports slight improvement in his symptoms since completing radiation recently. He will see Dr. Autumn next week and Dr. Izell again in April after his post treatment scans. He knows to contact me at anytime with questions or concerns.   [name] RN, BSN, OCN Head & Neck Oncology Nurse Navigator [facility] Phone # [phone]  Fax # [phone]

## 2024-04-05 NOTE — Addendum Note (Signed)
 Encounter addended by: Mohamed-Medani, Elverna LABOR, RN on: 04/05/2024 4:39 PM  Actions taken: Charge Capture section accepted

## 2024-04-05 NOTE — Progress Notes (Signed)
 " Radiation Oncology         [phone] ________________________________  Name: JAMYRON REDD MRN: [id number]  Date: 04/05/2024  DOB: August 08, 1990  Follow-Up Visit Note  CC: Wendee Lynwood CHRISTELLA, NP  Wendee Lynwood CHRISTELLA, NP  Diagnosis and Prior Radiotherapy:    C02.3   ICD-10-CM   1. Tongue cancer (HCC)  C02.9     2. 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  Mr. Humbarger presents today in the clinic for a follow up. 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  Pain issues, if any: Patient having 5 out of 10 tongue pain Using a feeding tube?: None Weight changes, if any: Weight has stayed stable throughout. Swallowing issues, if any: None Smoking or chewing tobacco? None Using fluoride toothpaste daily? None Last ENT visit was on: October 2025 Other notable issues, if any:   He feels better overall. He has patchy oral mucositis on the right lateral tongue and posterior oropharynx that is painless but limits tongue movement and makes eating difficult.    He is not smoking or drinking alcohol, including non-alcoholic beer, because of complete loss of taste. He wants his port-a-cath and feeding tube removed. He is not using Healios due to cost.    ALLERGIES:  has no known allergies.  Meds: Current Outpatient Medications  Medication Sig Dispense Refill   ciprofloxacin  (CIPRO ) 500 MG tablet Take 1 tablet (500 mg total) by mouth 2 (two) times daily. (Patient not taking: Reported on 02/25/2024) 20 tablet 0   dexamethasone  (DECADRON ) 4 MG tablet Take 2 tablets (8 mg) by mouth  daily x 3 days starting the day after cisplatin  chemotherapy. Take with food. 30 tablet 1   fexofenadine  (ALLEGRA  ALLERGY) 180 MG tablet Take 1 tablet (180 mg total) by mouth daily. (Patient not taking: Reported on 02/25/2024) 90 tablet 1   fluticasone  (FLONASE ) 50 MCG/ACT nasal spray Place 2 sprays into both nostrils daily. 48 mL 1   gabapentin (NEURONTIN) 100 MG capsule Take 100 mg by mouth 3 (three) times daily. (Patient not taking: Reported on 02/25/2024)     hydrOXYzine  (ATARAX ) 10 MG tablet Take 1-2 tablets (10-20 mg total) by mouth 3 (three) times daily as needed for anxiety. 30 tablet 1   lidocaine  (XYLOCAINE ) 2 % solution Patient: Mix 1part 2% viscous lidocaine , 1part H20. Swish & spit 8 mL of diluted mixture up to 12 times a day PRN soreness. May swallow up to 4 times a day if throat is sore. 300 mL 3   lidocaine -prilocaine  (EMLA ) cream Apply to affected area once 30 g 3   magic mouthwash w/lidocaine  SOLN Swish 10mL in mouth and spit, take PRN soreness.  Use up to 10 times daily. 1000 mL 3   Melatonin 10 MG TABS Take 10 mg by mouth at bedtime. (Patient not taking: Reported on 02/25/2024)     Nutritional Supplements (NUTREN 1.5) LIQD Give 7 cartons Nutren (1750 mL) 1.5 or equivalent split over 4 feedings via G-tube daily.  Flush with 60 mL free water before and after bolus feeding.  Give additional  200 mL water 4 times a day.  Provides 2625 cal, 119 g protein, 2617 mL water/100% estimated needs.     ondansetron  (ZOFRAN ) 8 MG tablet Take 1 tablet (8 mg total) by mouth every 8 (eight) hours as needed for nausea or vomiting. Start on the third day after cisplatin . 30 tablet 1   oxyCODONE (ROXICODONE) 5 MG/5ML solution Take 5 mg by mouth every 6 (six) hours as needed. (Patient not taking: Reported on 02/25/2024)     prochlorperazine  (COMPAZINE ) 10 MG tablet Take 1 tablet (10 mg total) by mouth every 6 (six) hours as needed (Nausea or vomiting). 30 tablet 1   traMADol  (ULTRAM ) 50 MG tablet Take 1  tablet (50 mg total) by mouth every 6 (six) hours as needed. 60 tablet 0   No current facility-administered medications for this encounter.    Physical Findings: The patient is in no acute distress. Patient is alert and oriented. Wt Readings from Last 3 Encounters:  04/05/24 146 lb 3.2 oz (66.3 kg)  03/18/24 150 lb (68 kg)  03/11/24 149 lb (67.6 kg)    weight is 146 lb 3.2 oz (66.3 kg). His oral temperature is 98.8 F (37.1 C). His blood pressure is 127/88 and his pulse is 103 (abnormal). His respiration is 18 and oxygen saturation is 99%. .  General: Alert and oriented, in no acute distress HEENT: Head is normocephalic. Extraocular movements are intact   Patchy mucositis healing in the mouth, concentrated on the right lateral tongue. Neck: Neck is notable for no masses Skin: Skin in treatment fields shows satisfactory healing over neck Extremities: No cyanosis or edema. Lymphatics: see Neck Exam Psychiatric: Judgment and insight are intact. Affect is appropriate.   Lab Findings: Lab Results  Component Value Date   WBC 3.5 (L) 03/18/2024   HGB 13.4 03/18/2024   HCT 37.1 (L) 03/18/2024   MCV 88.1 03/18/2024   PLT 216 03/18/2024    Lab Results  Component Value Date   TSH 1.130 01/13/2024    Radiographic Findings: No results found.  Impression/Plan:    + Head and Neck Cancer Status: healing well from ChRT   + Nutritional Status: sees nutrition today.  Stabilizing wt Wt Readings from Last 3 Encounters:  04/05/24 146 lb 3.2 oz (66.3 kg)  03/18/24 150 lb (68 kg)  03/11/24 149 lb (67.6 kg)   PEG tube: using  + Swallowing: advance oral diet as tolerated, continue SLP exercises  + Dental: Encouraged to continue regular followup with dentistry as indicated, and dental hygiene at home.    + Thyroid  function:  Lab Results  Component Value Date   TSH 1.130 01/13/2024   Advised to check TSH annually - ideally with PCP to avoid extra needle sticks but otherwise med onc  will add to labs.  +  Follow-up in mid April after CT of neck, chest, abd with IV contrast. The patient was encouraged to call with any issues or questions before then.  On date of service, in total, I spent 25 minutes on this encounter. Patient was seen in person. _____________________________________   Lauraine Golden, MD  "

## 2024-04-05 NOTE — Progress Notes (Signed)
 [facility] Clinical Social Work  Clinical Social Work was referred by RD for financial concerns with tube feeding supplies.  Clinical Social Worker met with patient and wife to offer support and assess for needs.    Per pt & wife, the issue is that the supplier for the tube feed supplies is stating that pt does not have insurance even though he does (COBRA- [employer]). CSW shared information with RD who is coordinating with the company.  CSW reviewed other potential resources (Medicaid, [facility]), but unfortunately, pt does not qualify based on household income).        Follow Up Plan:  Patient will contact CSW with any support or resource needs    Savir Blanke E Garland Smouse, LCSW  Clinical Social Worker [facility]

## 2024-04-06 ENCOUNTER — Ambulatory Visit: Payer: Self-pay | Admitting: Physical Therapy

## 2024-04-06 ENCOUNTER — Other Ambulatory Visit: Payer: Self-pay | Admitting: Nutrition

## 2024-04-06 ENCOUNTER — Other Ambulatory Visit: Payer: Self-pay

## 2024-04-06 MED ORDER — NUTREN 1.5 EN LIQD: 1750.0000 mL | Freq: Every day | ENTERAL | 12 refills | Status: AC

## 2024-04-06 MED ORDER — NUTREN 1.5 EN LIQD: ENTERAL | Status: DC

## 2024-04-06 NOTE — Progress Notes (Signed)
 TF orders written.

## 2024-04-12 ENCOUNTER — Ambulatory Visit: Payer: Self-pay | Attending: Radiation Oncology | Admitting: Physical Therapy

## 2024-04-12 DIAGNOSIS — R293 Abnormal posture: Secondary | ICD-10-CM

## 2024-04-12 DIAGNOSIS — M25511 Pain in right shoulder: Secondary | ICD-10-CM

## 2024-04-12 DIAGNOSIS — C023 Malignant neoplasm of anterior two-thirds of tongue, part unspecified: Secondary | ICD-10-CM

## 2024-04-12 DIAGNOSIS — M25611 Stiffness of right shoulder, not elsewhere classified: Secondary | ICD-10-CM

## 2024-04-13 ENCOUNTER — Inpatient Hospital Stay: Payer: Self-pay | Admitting: Oncology

## 2024-04-13 ENCOUNTER — Inpatient Hospital Stay: Payer: Self-pay | Attending: Oncology

## 2024-04-13 VITALS — BP 124/74 | HR 98 | Temp 98.2°F | Resp 18 | Ht 69.0 in | Wt 147.3 lb

## 2024-04-13 DIAGNOSIS — G893 Neoplasm related pain (acute) (chronic): Secondary | ICD-10-CM | POA: Diagnosis not present

## 2024-04-13 DIAGNOSIS — C023 Malignant neoplasm of anterior two-thirds of tongue, part unspecified: Secondary | ICD-10-CM

## 2024-04-13 LAB — CBC WITH DIFFERENTIAL (CANCER CENTER ONLY)
Abs Immature Granulocytes: 0.01 10*3/uL (ref 0.00–0.07)
Basophils Absolute: 0 10*3/uL (ref 0.0–0.1)
Basophils Relative: 1 %
Eosinophils Absolute: 0.1 10*3/uL (ref 0.0–0.5)
Eosinophils Relative: 2 %
HCT: 38.4 % — ABNORMAL LOW (ref 39.0–52.0)
Hemoglobin: 13.3 g/dL (ref 13.0–17.0)
Immature Granulocytes: 0 %
Lymphocytes Relative: 26 %
Lymphs Abs: 0.9 10*3/uL (ref 0.7–4.0)
MCH: 31.7 pg (ref 26.0–34.0)
MCHC: 34.6 g/dL (ref 30.0–36.0)
MCV: 91.6 fL (ref 80.0–100.0)
Monocytes Absolute: 0.4 10*3/uL (ref 0.1–1.0)
Monocytes Relative: 11 %
Neutro Abs: 2 10*3/uL (ref 1.7–7.7)
Neutrophils Relative %: 60 %
Platelet Count: 181 10*3/uL (ref 150–400)
RBC: 4.19 MIL/uL — ABNORMAL LOW (ref 4.22–5.81)
RDW: 14.2 % (ref 11.5–15.5)
WBC Count: 3.3 10*3/uL — ABNORMAL LOW (ref 4.0–10.5)
nRBC: 0 % (ref 0.0–0.2)

## 2024-04-13 LAB — CMP (CANCER CENTER ONLY)
ALT: 36 U/L (ref 0–44)
AST: 33 U/L (ref 15–41)
Albumin: 4.5 g/dL (ref 3.5–5.0)
Alkaline Phosphatase: 76 U/L (ref 38–126)
Anion gap: 10 (ref 5–15)
BUN: 15 mg/dL (ref 6–20)
CO2: 29 mmol/L (ref 22–32)
Calcium: 9.9 mg/dL (ref 8.9–10.3)
Chloride: 101 mmol/L (ref 98–111)
Creatinine: 0.83 mg/dL (ref 0.61–1.24)
GFR, Estimated: >60 mL/min
Glucose, Bld: 105 mg/dL — ABNORMAL HIGH (ref 70–99)
Potassium: 3.9 mmol/L (ref 3.5–5.1)
Sodium: 140 mmol/L (ref 135–145)
Total Bilirubin: 0.9 mg/dL (ref 0.0–1.2)
Total Protein: 7.2 g/dL (ref 6.5–8.1)

## 2024-04-13 LAB — MAGNESIUM: Magnesium: 2 mg/dL (ref 1.7–2.4)

## 2024-04-13 MED ORDER — TRAMADOL HCL 50 MG PO TABS: 50.0000 mg | ORAL_TABLET | Freq: Four times a day (QID) | ORAL | 0 refills | Status: AC | PRN

## 2024-04-13 NOTE — Progress Notes (Unsigned)
 "  [facility]  ONCOLOGY CLINIC PROGRESS NOTE   Patient Care Team: Wendee Lynwood HERO, NP as PCP - General (Nurse Practitioner) Otho Redell Curry, MD as Referring Physician (Otolaryngology) Malmfelt, Delon CROME, RN as Oncology Nurse Navigator Izell Domino, MD as Consulting Physician (Radiation Oncology) Autumn Millman, MD as Consulting Physician (Oncology)  PATIENT NAME: Benjamin Choi   MR#: [id number] DOB: May 14, 1990  Date of visit: 04/13/2024   ASSESSMENT & PLAN:   Benjamin Choi is a 34 y.o. 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 Focal positive for LVI and intratumoral PNI.  Margins positive.   No problem-specific Assessment & Plan notes found for this encounter.  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 This is a common and expected adverse effect of chemoradiation for oral malignancy. Symptoms are expected to improve gradually over the next several weeks. - Reviewed supportive care measures, including mouthwash, topical anesthetics, and baking soda/salt rinses. - Reinforced expectation of initial improvement within two weeks and more substantial recovery in three to four weeks. - Encouraged continued adherence to prescribed oral care regimen.  Neutropenia due to chemotherapy He experienced a transient chemotherapy-induced neutropenia, with a nadir last week. Current white blood cell count is 3,500/L, slightly below normal but improving. Other hematologic and organ function parameters are within normal limits. - Monitored blood counts today, which demonstrated improvement. - Anticipated further recovery of  leukocyte count now that chemotherapy is complete. - Planned repeat laboratory monitoring at next follow-up.  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 There is risk of persistent trismus if muscle function is not maintained, but gradual improvement is expected. - Encouraged ongoing mouth opening exercises and sips of water to preserve muscle function and prevent contractures. - Reinforced importance of home exercises as instructed by speech and swallow therapy. - Will reassess swallowing and jaw function at next visit.   I reviewed lab results and outside records for this visit and discussed relevant results with the patient. Diagnosis, plan of care and treatment options were also discussed in detail with the patient. Opportunity provided to ask questions and answers provided to his apparent satisfaction. Provided instructions to call our clinic with any problems, questions or concerns prior to return visit. I recommended to continue follow-up with PCP and sub-specialists. He verbalized understanding and agreed with the plan.   NCCN guidelines have been consulted in the planning of this patients care.  I spent a total of 33 minutes during this encounter with the patient including review of chart and various tests results, discussions about plan of care and coordination of care plan.   Millman Autumn, MD  04/13/2024 9:16 AM  [facility] - A DEPT OF [facility] [address] [place] [address] Dept: [phone] Dept Fax: [phone]    CHIEF COMPLAINT/ REASON FOR VISIT:   Invasive squamous cell carcinoma of the right lateral margin of tongue, status post partial glossectomy and right cervical lymph node dissection in September 2025.  Focal positive for LVI and intratumoral PNI.  Margins positive.   Current Treatment: Concurrent chemoradiation with weekly cisplatin  started from 01/27/2024. Completed 6 cycles of cisplatin  as of 03/11/2024.  Patient deferred dose #7 and completed radiation treatments on 03/18/2024.  INTERVAL HISTORY:    Discussed the use of AI scribe software for clinical note transcription with the patient, who gave verbal consent to proceed.  History of Present Illness  Benjamin Choi is a 34 year old male with malignant neoplasm of the oral cavity who presents for follow-up to assess response to concurrent chemoradiation and management of treatment-related toxicities.  He is completing his final session of radiation therapy today, having received six cycles of concurrent chemotherapy.  He has developed significant oral mucositis characterized by painful mucosal ulcerations, dysphagia, and trismus, resulting in near-complete dependence on tube feeding. He remains unable to tolerate oral intake beyond sips of water and continues to have difficulty with mouth opening.  He experiences fatigue and general malaise, but has not reported new or unexpected symptoms outside of those anticipated from chemoradiation.  Oral pain is managed with mouthwash, topical anesthetics, and baking soda/salt rinses; insurance did not cover compounded mouthwash. He performs oral exercises to maintain jaw mobility and prevent contractures, with ongoing concern for persistent trismus.  Recent laboratory studies revealed mild leukopenia (WBC 3,500), which is improving. Hemoglobin, platelet count, renal function, liver enzymes, and electrolytes have remained stable. He has not experienced infectious complications or other acute issues related to neutropenia.    I have reviewed the past medical history, past surgical history, social history and family history with the patient and they are unchanged from previous note.  HISTORY OF PRESENT ILLNESS:   ONCOLOGY HISTORY:   He  initially presented to his PCP in December of 2024 with a painful sore on his right lateral tongue. He was given triamcinolone  paste at that time to manage this without relief achieved.  He returned to his PCP in May of 2025 with c/o persistent pain from the lesion and was advised to try magic mouthwash which again did not provide him any relief. He was subsequently advised to follow-up with his dentist who shaved down a sharp molar to help prevent further irritation against his tongue.   Given persistence of the painful tongue lesion, a biopsy was obtained on 10/27/23. Pathology showed findings consistent with keratinizing invasive squamous cell carcinoma with a minute focus of apparent LVI seen in a small vessel near the deep margin (and with tumor present in all margins of the submitted specimen).    Subsequently, the patient was referred to [facility] and was seen in consultation by Dr. Sallyann on 11/10/23 for further management. Oral examination performed at that time confirmed a 2 cm tender, ulcerated right lateral tongue mass. A laryngoscopy was also performed which showed no abnormal findings. Examination of the neck was also negative for cervical lymphadenopathy.    Imaging was advised for further evaluation and he accordingly presented for a soft tissue neck CT and chest CT at [facility] on 11/23/23 which collectively demonstrated: no definite evidence of invasive of local invasion within the soft tissues of the neck, with bilateral submental and jugular chain lymph nodes measure up to 7 mm in thickness, within normal limits. CT findings were also negative for metastatic disease within the chest.    Based on Dr. Elvis recommendations, he opted to proceed with a partial glossectomy with right cervical lymph node dissections on 12/03/23. Pathology from the procedure showed: tumor the size of 1.3 cm; histology of moderately differentiated invasive  squamous cell carcinoma of the tongue (keratinizing)  invading a depth of 0.8 cm; positive for focal LVI and intratumoral PNI; Margin positive, nodal status of 0/29 excised lymph nodes were positive for carcinoma. pT2, pN0, cM0   He did return to the ED on 12/15/23 with c/o bleeding from his mouth. Labs collected in the ED were unremarkable and the bleeding had stopped by the time that he was evaluated in the ED. No intervention was required and he was advised to follow-up with ENT in an OP setting.    Swallowing issues, if any: none   Weight Changes: 5 lbs of weight loss (likely due to diet changes secondary to tongue pain)   Pain status: tongue is numb, was 10/10 pain prior to surgery. Recently bit his tongue and caused a wound in the Right lateral tongue.  Had dental evaluation and no intervention was needed.   Other symptoms: only able to eat soft foods due to initial pain from the lesion (eventually improved to eating chicken and steak); also reported that even drinking water would irritate his tongue    Tobacco history, if any: Former off/on smoker -- quit   ETOH abuse, if any: stopped drinking.   His case was discussed in ENT multidisciplinary tumor conference on 01/13/2024.  Because of high risk features including positive margin status, focal positivity for LVI and intratumoral PNI, recommendation made to proceed with adjuvant concurrent chemoradiation.  Plan to proceed with weekly cisplatin  during the course of radiation.  Started concurrent chemoradiation with weekly cisplatin  from 01/27/2024.  Completed 6 cycles of cisplatin  as of 03/11/2024.  Patient deferred dose #7 and completed radiation treatments on 03/18/2024.  Oncology History  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)  01/03/2024 Initial Diagnosis   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)   01/13/2024 Cancer Staging   Staging form: Oral Cavity, AJCC 8th Edition - Pathologic: Stage II (pT2, pN0, cM0) -  Signed by Autumn Millman, MD on 01/13/2024 Stage prefix: Initial diagnosis Residual tumor (R): R1 Lymph-vascular invasion (LVI): LVI present/identified, NOS Perineural invasion (PNI): Present Subclassification of lymphovascular invasion: Intratumoral   01/29/2024 -  Chemotherapy   Patient is on Treatment Plan : HEAD/NECK Cisplatin  (40) q7d         REVIEW OF SYSTEMS:   Review of Systems - Oncology  All other pertinent systems were reviewed with the patient and are negative.  ALLERGIES: He has no known allergies.  MEDICATIONS:  Current Outpatient Medications  Medication Sig Dispense Refill   fluticasone  (FLONASE ) 50 MCG/ACT nasal spray Place 2 sprays into both nostrils daily. 48 mL 1   hydrOXYzine  (ATARAX ) 10 MG tablet Take 1-2 tablets (10-20 mg total) by mouth 3 (three) times daily as needed for anxiety. 30 tablet 1   Nutritional Supplements (NUTREN 1.5) LIQD 1,750 mLs by Enteral route daily. Give 7 cartons Nutren 1750 ml 1.5 or equivalent split over 4 feedings via G-tube daily. Flush with 60 ml free water before and after bolus feeding. Give additional 200 ml water four times daily. Provides 2625 calories, 119 gram protein, 2617 ml water/100 estimated needs. 52500 mL 12   traMADol  (ULTRAM ) 50 MG tablet Take 1 tablet (50 mg total) by mouth every 6 (six) hours as needed. 60 tablet 0   dexamethasone  (DECADRON ) 4 MG tablet Take 2 tablets (8 mg) by mouth daily x 3 days starting the day after cisplatin  chemotherapy. Take with food. (Patient not taking: Reported on 04/13/2024)  30 tablet 1   fexofenadine  (ALLEGRA  ALLERGY) 180 MG tablet Take 1 tablet (180 mg total) by mouth daily. (Patient not taking: Reported on 04/13/2024) 90 tablet 1   gabapentin (NEURONTIN) 100 MG capsule Take 100 mg by mouth 3 (three) times daily. (Patient not taking: Reported on 04/13/2024)     lidocaine  (XYLOCAINE ) 2 % solution Patient: Mix 1part 2% viscous lidocaine , 1part H20. Swish & spit 8 mL of diluted mixture up to 12  times a day PRN soreness. May swallow up to 4 times a day if throat is sore. (Patient not taking: Reported on 04/13/2024) 300 mL 3   lidocaine -prilocaine  (EMLA ) cream Apply to affected area once (Patient not taking: Reported on 04/13/2024) 30 g 3   magic mouthwash w/lidocaine  SOLN Swish 10mL in mouth and spit, take PRN soreness.  Use up to 10 times daily. (Patient not taking: Reported on 04/13/2024) 1000 mL 3   Melatonin 10 MG TABS Take 10 mg by mouth at bedtime. (Patient not taking: Reported on 04/13/2024)     ondansetron  (ZOFRAN ) 8 MG tablet Take 1 tablet (8 mg total) by mouth every 8 (eight) hours as needed for nausea or vomiting. Start on the third day after cisplatin . (Patient not taking: Reported on 04/13/2024) 30 tablet 1   oxyCODONE (ROXICODONE) 5 MG/5ML solution Take 5 mg by mouth every 6 (six) hours as needed. (Patient not taking: Reported on 04/13/2024)     prochlorperazine  (COMPAZINE ) 10 MG tablet Take 1 tablet (10 mg total) by mouth every 6 (six) hours as needed (Nausea or vomiting). (Patient not taking: Reported on 04/13/2024) 30 tablet 1   No current facility-administered medications for this visit.     VITALS:   Blood pressure 124/74, pulse 98, temperature 98.2 F (36.8 C), temperature source Temporal, resp. rate 18, height 5' 9 (1.753 m), weight 147 lb 4.8 oz (66.8 kg), SpO2 98%.  Wt Readings from Last 3 Encounters:  04/13/24 147 lb 4.8 oz (66.8 kg)  04/05/24 146 lb 3.2 oz (66.3 kg)  03/18/24 150 lb (68 kg)    Body mass index is 21.75 kg/m.    Onc Performance Status - 04/13/24 0908       ECOG Perf Status   ECOG Perf Status Ambulatory and capable of all selfcare but unable to carry out any work activities.  Up and about more than 50% of waking hours      KPS SCALE   KPS % SCORE Cares for self, unable to carry on normal activity or to do active work           PHYSICAL EXAM:   Physical Exam Constitutional:      General: He is not in acute distress.    Appearance: Normal  appearance.  HENT:     Head: Normocephalic and atraumatic.     Mouth/Throat:     Comments: Radiation associated mucositis noted, mild. No evidence of thrush.  Eyes:     Conjunctiva/sclera: Conjunctivae normal.  Neck:     Comments:  Right-sided neck dissection scar has healed well. Cardiovascular:     Rate and Rhythm: Normal rate and regular rhythm.  Pulmonary:     Effort: Pulmonary effort is normal. No respiratory distress.  Abdominal:     General: There is no distension.  Skin:    Findings: Rash: resolved.  Neurological:     General: No focal deficit present.     Mental Status: He is alert and oriented to person, place, and time.  Psychiatric:  Mood and Affect: Mood normal.        Behavior: Behavior normal.       LABORATORY DATA:   I have reviewed the data as listed.  Results for orders placed or performed in visit on 04/13/24  CBC with Differential (Cancer Center Only)  Result Value Ref Range   WBC Count 3.3 (L) 4.0 - 10.5 K/uL   RBC 4.19 (L) 4.22 - 5.81 MIL/uL   Hemoglobin 13.3 13.0 - 17.0 g/dL   HCT 61.5 (L) 60.9 - 47.9 %   MCV 91.6 80.0 - 100.0 fL   MCH 31.7 26.0 - 34.0 pg   MCHC 34.6 30.0 - 36.0 g/dL   RDW 85.7 88.4 - 84.4 %   Platelet Count 181 150 - 400 K/uL   nRBC 0.0 0.0 - 0.2 %   Neutrophils Relative % 60 %   Neutro Abs 2.0 1.7 - 7.7 K/uL   Lymphocytes Relative 26 %   Lymphs Abs 0.9 0.7 - 4.0 K/uL   Monocytes Relative 11 %   Monocytes Absolute 0.4 0.1 - 1.0 K/uL   Eosinophils Relative 2 %   Eosinophils Absolute 0.1 0.0 - 0.5 K/uL   Basophils Relative 1 %   Basophils Absolute 0.0 0.0 - 0.1 K/uL   Immature Granulocytes 0 %   Abs Immature Granulocytes 0.01 0.00 - 0.07 K/uL       RADIOGRAPHIC STUDIES:  I have personally reviewed the radiological images as listed and agree with the findings in the report.  IR GASTROSTOMY TUBE MOD SED INDICATION: treatment for head and neck cancer trouble swallowing  EXAM: PERCUTANEOUS GASTROSTOMY TUBE  PLACEMENT  COMPARISON:  CT AP, 02/02/2024  MEDICATIONS: Ancef  2 gm IV; Antibiotics were administered within 1 hour of the procedure.  0.5 mg glucagon  IV  CONTRAST:  15 mL of Isovue 300 administered into the gastric lumen.  ANESTHESIA/SEDATION: Moderate (conscious) sedation was employed during this procedure. A total of Versed  3 mg and Fentanyl  150 mcg was administered intravenously.  Moderate Sedation Time: 14 minutes. The patient's level of consciousness and vital signs were monitored continuously by radiology nursing throughout the procedure under my direct supervision.  FLUOROSCOPY: Radiation Exposure Index and estimated peak skin dose (PSD);  Reference air kerma (RAK), 2 mGy.  COMPLICATIONS: None immediate.  PROCEDURE: Informed written consent was obtained from the patient and/or patient's representative following explanation of the procedure, risks, benefits and alternatives. A time out was performed prior to the initiation of the procedure. Maximal barrier sterile technique utilized including caps, mask, sterile gowns, sterile gloves, large sterile drape, hand hygiene and sterile prep.  The LEFT costal margin and barium opacified transverse colon were identified and avoided. Air was injected into the stomach for insufflation and visualization under fluoroscopy. Under sterile conditions and local anesthesia, 2 T tacks were utilized to pexy the anterior aspect of the stomach against the ventral abdominal wall. Contrast injection confirmed appropriate positioning of each of the T tacks. An incision was made between the T tacks and a 17 gauge trocar needle was utilized to access the stomach. Needle position was confirmed within the stomach with aspiration of air and injection of a small amount of contrast. A stiff guidewire was advanced into the gastric lumen and under intermittent fluoroscopic guidance, the access needle was exchanged for a telescoping peel-away  sheath, ultimately allowing placement of a 14 Fr balloon retention gastrostomy tube. The retention balloon was insufflated with a mixture of dilute saline and contrast and pulled taut against the anterior  wall of the stomach. The external disc was cinched. Contrast injection confirms positioning within the stomach. Several spot radiographic images were obtained in various obliquities for documentation. The patient tolerated procedure well without immediate post procedural complication.  FINDINGS: After successful fluoroscopic guided placement, the gastrostomy tube is appropriately positioned with internal retention balloon against the ventral aspect of the gastric lumen.  IMPRESSION: Successful fluoroscopic insertion of a 14 Fr balloon retention gastrostomy tube.  The gastrostomy may be used immediately for medication administration and in 4 hrs for the initiation of feeds.  RECOMMENDATIONS: The patient will return to [facility] ([facility]) for routine feeding tube evaluation and exchange in 6 months.  Thom Hall, MD  [facility]  [facility]  Electronically Signed   By: Thom Hall M.D.   On: 02/12/2024 17:27    CODE STATUS:  Code Status History     Date Active Date Inactive Code Status Order ID Comments User Context   01/26/2024 1510 01/27/2024 0527 Full Code [id number]  Philip Cornet, MD HOV    Questions for Most Recent Historical Code Status (Order [id number])     Question Answer   By: Consent: discussion documented in EHR            No orders of the defined types were placed in this encounter.    Future Appointments  Date Time Provider Department Center  04/20/2024 11:00 AM [place], [place] L, [place] [facility] None  04/21/2024 10:30 AM Daryle Heron CROME, RD [facility] None  04/27/2024 11:00 AM [place], [place] L, PT [facility] None  06/22/2024 11:30 AM Izell Domino, MD [facility] None   08/12/2024  3:30 PM [facility] 1 [facility] [place]      This document was completed utilizing speech recognition software. Grammatical errors, random word insertions, pronoun errors, and incomplete sentences are an occasional consequence of this system due to software limitations, ambient noise, and hardware issues. Any formal questions or concerns about the content, text or information contained within the body of this dictation should be directly addressed to the provider for clarification.   "

## 2024-04-14 ENCOUNTER — Encounter: Payer: Self-pay | Admitting: Oncology

## 2024-04-14 NOTE — Assessment & Plan Note (Signed)
 Please review oncology history for additional details and timeline of events.  He underwent partial glossectomy with right cervical lymph node dissections on 12/03/23. 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 pT2, pN0, cM0.  His case was discussed in ENT multidisciplinary tumor conference on 01/13/2024.  Because of high risk features including positive margin status, focal positivity for LVI and intratumoral PNI, recommendation made to proceed with adjuvant concurrent chemoradiation.    Previously I discussed recent developments, findings, pathology reports and consensus opinion from ENT tumor conference with the patient and his wife who was accompanying.  They verbalized understanding.  He is in agreement with the plan to proceed with concurrent chemoradiation, given positive margins.  We have discussed about role of cisplatin  being a radiosensitizer in the treatment of head and neck cancer.  We have discussed about the curative intent of chemoradiation for this patient.  Patient is willing to proceed with weekly cisplatin .  Started radiation treatments from 01/27/2024.  Started chemotherapy with cisplatin  from 01/29/2024.   Completed 6 cycles of cisplatin  as of 03/11/2024.  He completed radiation treatments on 03/18/2024.  He opted to defer cycle #7 of cisplatin .  He did undergo feeding tube placement on 02/12/2024.  Relying on tube feeds now mostly.  Previously referrals sent for nutritionist, speech-language pathology, physical therapy for supportive care.  Persistent oral dysfunction and nutritional compromise with gradual recovery.  - Continue routine surveillance; next CT scan scheduled for June 17, 2024.  -  Alternate follow-up every 3-4 months between medical oncology, radiation oncology and ENT.  - Continue laboratory monitoring of blood counts and organ function.  - Plan removal of feeding tube and port after next scan if oral intake improves and no evidence of recurrence.  - Continue assessment of swallowing, trismus, and nutritional status with nutritionist and lymphedema therapy involvement.  - Next oncology follow-up scheduled for late May.

## 2024-04-20 ENCOUNTER — Ambulatory Visit: Payer: Self-pay | Admitting: Physical Therapy

## 2024-04-21 ENCOUNTER — Inpatient Hospital Stay: Admitting: Nutrition

## 2024-04-27 ENCOUNTER — Ambulatory Visit: Payer: Self-pay | Admitting: Physical Therapy

## 2024-06-22 ENCOUNTER — Ambulatory Visit: Admitting: Radiation Oncology

## 2024-07-28 ENCOUNTER — Inpatient Hospital Stay: Admitting: Oncology

## 2024-07-28 ENCOUNTER — Inpatient Hospital Stay: Attending: Oncology

## 2024-08-12 ENCOUNTER — Other Ambulatory Visit (HOSPITAL_COMMUNITY)
